# Patient Record
Sex: Male | Born: 1980 | Hispanic: Yes | Marital: Single | Smoking: Never smoker
Health system: Southern US, Community
[De-identification: ages and names within clinical notes are randomized; demographics above are authoritative.]

---

## 2020-11-06 ENCOUNTER — Emergency Department: Payer: Medicaid Other

## 2020-11-06 ENCOUNTER — Inpatient Hospital Stay
Admission: EM | Admit: 2020-11-06 | Discharge: 2020-12-12 | DRG: 974 | Disposition: E | Payer: Medicaid Other | Attending: Pulmonary Disease | Admitting: Pulmonary Disease

## 2020-11-06 ENCOUNTER — Other Ambulatory Visit: Payer: Self-pay

## 2020-11-06 DIAGNOSIS — D649 Anemia, unspecified: Secondary | ICD-10-CM | POA: Diagnosis present

## 2020-11-06 DIAGNOSIS — N179 Acute kidney failure, unspecified: Secondary | ICD-10-CM | POA: Diagnosis present

## 2020-11-06 DIAGNOSIS — Z6825 Body mass index (BMI) 25.0-25.9, adult: Secondary | ICD-10-CM

## 2020-11-06 DIAGNOSIS — R7401 Elevation of levels of liver transaminase levels: Secondary | ICD-10-CM | POA: Diagnosis present

## 2020-11-06 DIAGNOSIS — Z978 Presence of other specified devices: Secondary | ICD-10-CM

## 2020-11-06 DIAGNOSIS — Z66 Do not resuscitate: Secondary | ICD-10-CM | POA: Diagnosis present

## 2020-11-06 DIAGNOSIS — K819 Cholecystitis, unspecified: Secondary | ICD-10-CM

## 2020-11-06 DIAGNOSIS — B9789 Other viral agents as the cause of diseases classified elsewhere: Secondary | ICD-10-CM | POA: Diagnosis present

## 2020-11-06 DIAGNOSIS — R739 Hyperglycemia, unspecified: Secondary | ICD-10-CM | POA: Diagnosis present

## 2020-11-06 DIAGNOSIS — J80 Acute respiratory distress syndrome: Secondary | ICD-10-CM | POA: Diagnosis present

## 2020-11-06 DIAGNOSIS — E875 Hyperkalemia: Secondary | ICD-10-CM | POA: Diagnosis not present

## 2020-11-06 DIAGNOSIS — J939 Pneumothorax, unspecified: Secondary | ICD-10-CM

## 2020-11-06 DIAGNOSIS — Z452 Encounter for adjustment and management of vascular access device: Secondary | ICD-10-CM

## 2020-11-06 DIAGNOSIS — J9600 Acute respiratory failure, unspecified whether with hypoxia or hypercapnia: Secondary | ICD-10-CM

## 2020-11-06 DIAGNOSIS — B250 Cytomegaloviral pneumonitis: Secondary | ICD-10-CM | POA: Diagnosis present

## 2020-11-06 DIAGNOSIS — R6521 Severe sepsis with septic shock: Secondary | ICD-10-CM | POA: Diagnosis present

## 2020-11-06 DIAGNOSIS — E872 Acidosis: Secondary | ICD-10-CM | POA: Diagnosis not present

## 2020-11-06 DIAGNOSIS — E440 Moderate protein-calorie malnutrition: Secondary | ICD-10-CM | POA: Diagnosis present

## 2020-11-06 DIAGNOSIS — B583 Pulmonary toxoplasmosis: Secondary | ICD-10-CM | POA: Diagnosis present

## 2020-11-06 DIAGNOSIS — Z4659 Encounter for fitting and adjustment of other gastrointestinal appliance and device: Secondary | ICD-10-CM

## 2020-11-06 DIAGNOSIS — B59 Pneumocystosis: Secondary | ICD-10-CM

## 2020-11-06 DIAGNOSIS — A419 Sepsis, unspecified organism: Secondary | ICD-10-CM

## 2020-11-06 DIAGNOSIS — Z21 Asymptomatic human immunodeficiency virus [HIV] infection status: Secondary | ICD-10-CM | POA: Diagnosis present

## 2020-11-06 DIAGNOSIS — G9341 Metabolic encephalopathy: Secondary | ICD-10-CM | POA: Diagnosis not present

## 2020-11-06 DIAGNOSIS — R609 Edema, unspecified: Secondary | ICD-10-CM

## 2020-11-06 DIAGNOSIS — A539 Syphilis, unspecified: Secondary | ICD-10-CM

## 2020-11-06 DIAGNOSIS — R0902 Hypoxemia: Secondary | ICD-10-CM

## 2020-11-06 DIAGNOSIS — E8770 Fluid overload, unspecified: Secondary | ICD-10-CM | POA: Diagnosis not present

## 2020-11-06 DIAGNOSIS — B20 Human immunodeficiency virus [HIV] disease: Principal | ICD-10-CM

## 2020-11-06 DIAGNOSIS — R0682 Tachypnea, not elsewhere classified: Secondary | ICD-10-CM

## 2020-11-06 DIAGNOSIS — A4189 Other specified sepsis: Secondary | ICD-10-CM | POA: Diagnosis present

## 2020-11-06 DIAGNOSIS — B259 Cytomegaloviral disease, unspecified: Secondary | ICD-10-CM

## 2020-11-06 DIAGNOSIS — J9601 Acute respiratory failure with hypoxia: Secondary | ICD-10-CM

## 2020-11-06 DIAGNOSIS — Z7951 Long term (current) use of inhaled steroids: Secondary | ICD-10-CM

## 2020-11-06 DIAGNOSIS — Z01818 Encounter for other preprocedural examination: Secondary | ICD-10-CM

## 2020-11-06 DIAGNOSIS — A528 Late syphilis, latent: Secondary | ICD-10-CM | POA: Diagnosis present

## 2020-11-06 DIAGNOSIS — R0600 Dyspnea, unspecified: Secondary | ICD-10-CM

## 2020-11-06 DIAGNOSIS — Z20822 Contact with and (suspected) exposure to covid-19: Secondary | ICD-10-CM | POA: Diagnosis present

## 2020-11-06 DIAGNOSIS — E871 Hypo-osmolality and hyponatremia: Secondary | ICD-10-CM

## 2020-11-06 DIAGNOSIS — J189 Pneumonia, unspecified organism: Secondary | ICD-10-CM | POA: Diagnosis present

## 2020-11-06 LAB — CBC WITH DIFFERENTIAL/PLATELET
Abs Immature Granulocytes: 0.05 10*3/uL (ref 0.00–0.07)
Basophils Absolute: 0 10*3/uL (ref 0.0–0.1)
Basophils Relative: 0 %
Eosinophils Absolute: 0 10*3/uL (ref 0.0–0.5)
Eosinophils Relative: 0 %
HCT: 38.8 % — ABNORMAL LOW (ref 39.0–52.0)
Hemoglobin: 13 g/dL (ref 13.0–17.0)
Immature Granulocytes: 1 %
Lymphocytes Relative: 18 %
Lymphs Abs: 1.6 10*3/uL (ref 0.7–4.0)
MCH: 27.8 pg (ref 26.0–34.0)
MCHC: 33.5 g/dL (ref 30.0–36.0)
MCV: 82.9 fL (ref 80.0–100.0)
Monocytes Absolute: 0.4 10*3/uL (ref 0.1–1.0)
Monocytes Relative: 4 %
Neutro Abs: 7.2 10*3/uL (ref 1.7–7.7)
Neutrophils Relative %: 77 %
Platelets: 324 10*3/uL (ref 150–400)
RBC: 4.68 MIL/uL (ref 4.22–5.81)
RDW: 13.2 % (ref 11.5–15.5)
WBC: 9.2 10*3/uL (ref 4.0–10.5)
nRBC: 0.2 % (ref 0.0–0.2)

## 2020-11-06 LAB — URINALYSIS, COMPLETE (UACMP) WITH MICROSCOPIC
Bacteria, UA: NONE SEEN
Bilirubin Urine: NEGATIVE
Glucose, UA: NEGATIVE mg/dL
Ketones, ur: NEGATIVE mg/dL
Leukocytes,Ua: NEGATIVE
Nitrite: NEGATIVE
Protein, ur: 30 mg/dL — AB
Specific Gravity, Urine: 1.014 (ref 1.005–1.030)
Squamous Epithelial / HPF: NONE SEEN (ref 0–5)
pH: 6 (ref 5.0–8.0)

## 2020-11-06 LAB — COMPREHENSIVE METABOLIC PANEL
ALT: 54 U/L — ABNORMAL HIGH (ref 0–44)
AST: 67 U/L — ABNORMAL HIGH (ref 15–41)
Albumin: 2.7 g/dL — ABNORMAL LOW (ref 3.5–5.0)
Alkaline Phosphatase: 129 U/L — ABNORMAL HIGH (ref 38–126)
Anion gap: 13 (ref 5–15)
BUN: 18 mg/dL (ref 6–20)
CO2: 19 mmol/L — ABNORMAL LOW (ref 22–32)
Calcium: 8.3 mg/dL — ABNORMAL LOW (ref 8.9–10.3)
Chloride: 94 mmol/L — ABNORMAL LOW (ref 98–111)
Creatinine, Ser: 1.14 mg/dL (ref 0.61–1.24)
GFR, Estimated: >60 mL/min (ref >60)
Glucose, Bld: 127 mg/dL — ABNORMAL HIGH (ref 70–99)
Potassium: 3.5 mmol/L (ref 3.5–5.1)
Sodium: 126 mmol/L — ABNORMAL LOW (ref 135–145)
Total Bilirubin: 0.7 mg/dL (ref 0.3–1.2)
Total Protein: 8.4 g/dL — ABNORMAL HIGH (ref 6.5–8.1)

## 2020-11-06 LAB — PROTIME-INR
INR: 1.1 (ref 0.8–1.2)
Prothrombin Time: 14.5 seconds (ref 11.4–15.2)

## 2020-11-06 LAB — LACTIC ACID, PLASMA: Lactic Acid, Venous: 1.9 mmol/L (ref 0.5–1.9)

## 2020-11-06 LAB — RESP PANEL BY RT-PCR (FLU A&B, COVID) ARPGX2
Influenza A by PCR: NEGATIVE
Influenza B by PCR: NEGATIVE
SARS Coronavirus 2 by RT PCR: NEGATIVE

## 2020-11-06 LAB — APTT: aPTT: 33 seconds (ref 24–36)

## 2020-11-06 LAB — TROPONIN I (HIGH SENSITIVITY)
Troponin I (High Sensitivity): 65 ng/L — ABNORMAL HIGH (ref <18)
Troponin I (High Sensitivity): 91 ng/L — ABNORMAL HIGH (ref <18)

## 2020-11-06 LAB — BRAIN NATRIURETIC PEPTIDE: B Natriuretic Peptide: 44.7 pg/mL (ref 0.0–100.0)

## 2020-11-06 MED ORDER — ACETAMINOPHEN 325 MG PO TABS
ORAL_TABLET | ORAL | Status: AC
  Administered 2020-11-06: 650 mg via ORAL
  Filled 2020-11-06: qty 2

## 2020-11-06 MED ORDER — SODIUM CHLORIDE 0.9 % IV SOLN
2.0000 g | INTRAVENOUS | Status: DC
  Administered 2020-11-06: 2 g via INTRAVENOUS
  Filled 2020-11-06: qty 20

## 2020-11-06 MED ORDER — SODIUM CHLORIDE 0.9 % IV SOLN
500.0000 mg | INTRAVENOUS | Status: AC
  Administered 2020-11-06 – 2020-11-11 (×6): 500 mg via INTRAVENOUS
  Filled 2020-11-06 (×7): qty 500

## 2020-11-06 MED ORDER — LACTATED RINGERS IV BOLUS (SEPSIS)
1000.0000 mL | Freq: Once | INTRAVENOUS | Status: AC
  Administered 2020-11-06: 1000 mL via INTRAVENOUS

## 2020-11-06 MED ORDER — ACETAMINOPHEN 325 MG PO TABS: 650.0000 mg | ORAL_TABLET | Freq: Once | ORAL | Status: AC

## 2020-11-06 NOTE — ED Provider Notes (Signed)
[facility] Emergency Department Provider Note   ____________________________________________   Event Date/Time   First MD Initiated Contact with Patient Nov 10, 2020 2052     (approximate)  I have reviewed the triage vital signs and the nursing notes.   HISTORY  Chief Complaint Shortness of Breath    HPI Jordan Gibson is a 40 y.o. male with no stated past medical history presents for shortness of breath and fevers and 6 days prior to arrival.  Patient states that the symptoms are worsened since onset as well as worsen with any exertion.  Patient denies any recent travel or sick contacts.  Patient denies any history of similar symptoms prior to last Friday.  Patient currently denies any vision changes, tinnitus, difficulty speaking, facial droop, sore throat, chest pain, abdominal pain, nausea/vomiting/diarrhea, dysuria, or weakness/numbness/paresthesias in any extremity         History reviewed. No pertinent past medical history.  Patient Active Problem List   Diagnosis Date Noted  . CAP (community acquired pneumonia) 11-10-20  . Sepsis (HCC) 2020/11/10  . Acute respiratory failure with hypoxia (HCC) 11/10/20  . Hyponatremia 11/10/20  . Transaminitis 11-10-20      Prior to Admission medications   Medication Sig Start Date End Date Taking? Authorizing Provider  acetaminophen (TYLENOL) 325 MG tablet Take 650 mg by mouth every 6 (six) hours as needed.   Yes [provider]  albuterol (VENTOLIN HFA) 108 (90 Base) MCG/ACT inhaler Inhale 2 puffs into the lungs every 4 (four) hours as needed. 10/31/20  Yes [provider]  fluticasone-salmeterol (ADVAIR) 100-50 MCG/ACT AEPB Inhale 1 puff into the lungs 2 (two) times daily. 11/03/20  Yes [provider]  Vitamin D, Ergocalciferol, (DRISDOL) 1.25 MG (50000 UNIT) CAPS capsule Take 1 capsule by mouth once a week. 10/31/20  Yes [provider]     Allergies Patient has no known allergies.  History reviewed. No pertinent family history.  Social History Social History   Tobacco Use  . Smoking status: Never Smoker  . Smokeless tobacco: Never Used  Substance Use Topics  . Alcohol use: Yes    Alcohol/week: 1.0 standard drink    Types: 1 Cans of beer per week    Review of Systems Constitutional: No fever/chills Eyes: No visual changes. ENT: No sore throat. Cardiovascular: Denies chest pain. Respiratory: Endorses shortness of breath. Gastrointestinal: No abdominal pain.  No nausea, no vomiting.  No diarrhea. Genitourinary: Negative for dysuria. Musculoskeletal: Negative for acute arthralgias Skin: Negative for rash. Neurological: Negative for headaches, weakness/numbness/paresthesias in any extremity Psychiatric: Negative for suicidal ideation/homicidal ideation   ____________________________________________   PHYSICAL EXAM:  VITAL SIGNS: ED Triage Vitals  Enc Vitals Group     BP 11-10-20 2039 129/80     Pulse Rate 10-Nov-2020 2039 (!) 132     Resp 11-10-2020 2039 (!) 24     Temp 11/10/20 2039 (!) 102.1 F (38.9 C)     Temp Source 10-Nov-2020 2039 Oral     SpO2 2020-11-10 2039 (!) 52 %     Weight 10-Nov-2020 2040 170 lb (77.1 kg)     Height --      Head Circumference --      Peak Flow --      Pain Score Nov 10, 2020 2040 0     Pain Loc --      Pain Edu? --      Excl. in GC? --    Constitutional: Alert and oriented. Well appearing and in no  acute distress. Eyes: Conjunctivae are normal. PERRL. Head: Atraumatic. Nose: No congestion/rhinnorhea. Mouth/Throat: Mucous membranes are moist. Neck: No stridor Cardiovascular: Grossly normal heart sounds.  Good peripheral circulation. Respiratory: Increased respiratory effort.  High flow nasal cannula in place.  Rales appreciated over bilateral lower lung fields Gastrointestinal: Soft and nontender. No distention. Musculoskeletal: No obvious deformities Neurologic:  Normal  speech and language. No gross focal neurologic deficits are appreciated. Skin:  Skin is warm and dry. No rash noted. Psychiatric: Mood and affect are normal. Speech and behavior are normal.  ____________________________________________   LABS (all labs ordered are listed, but only abnormal results are displayed)  Labs Reviewed  RESPIRATORY PANEL BY PCR - Abnormal; Notable for the following components:      Result Value   Rhinovirus / Enterovirus DETECTED (*)    All other components within normal limits  COMPREHENSIVE METABOLIC PANEL - Abnormal; Notable for the following components:   Sodium 126 (*)    Chloride 94 (*)    CO2 19 (*)    Glucose, Bld 127 (*)    Calcium 8.3 (*)    Total Protein 8.4 (*)    Albumin 2.7 (*)    AST 67 (*)    ALT 54 (*)    Alkaline Phosphatase 129 (*)    All other components within normal limits  CBC WITH DIFFERENTIAL/PLATELET - Abnormal; Notable for the following components:   HCT 38.8 (*)    All other components within normal limits  URINALYSIS, COMPLETE (UACMP) WITH MICROSCOPIC - Abnormal; Notable for the following components:   Color, Urine YELLOW (*)    APPearance HAZY (*)    Hgb urine dipstick SMALL (*)    Protein, ur 30 (*)    All other components within normal limits  BLOOD GAS, ARTERIAL - Abnormal; Notable for the following components:   pH, Arterial 7.50 (*)    pCO2 arterial 27 (*)    All other components within normal limits  CORTISOL-AM, BLOOD - Abnormal; Notable for the following components:   Cortisol - AM 31.1 (*)    All other components within normal limits  CBC - Abnormal; Notable for the following components:   Hemoglobin 12.0 (*)    HCT 35.1 (*)    All other components within normal limits  BASIC METABOLIC PANEL - Abnormal; Notable for the following components:   Sodium 129 (*)    Chloride 97 (*)    Calcium 8.0 (*)    All other components within normal limits  D-DIMER, QUANTITATIVE - Abnormal; Notable for the following  components:   D-Dimer, Quant 3.07 (*)    All other components within normal limits  GLUCOSE, CAPILLARY - Abnormal; Notable for the following components:   Glucose-Capillary 104 (*)    All other components within normal limits  BLOOD GAS, ARTERIAL - Abnormal; Notable for the following components:   pH, Arterial 7.19 (*)    pCO2 arterial 64 (*)    pO2, Arterial 70 (*)    Acid-base deficit 5.0 (*)    All other components within normal limits  GLUCOSE, CAPILLARY - Abnormal; Notable for the following components:   Glucose-Capillary 113 (*)    All other components within normal limits  BASIC METABOLIC PANEL - Abnormal; Notable for the following components:   Sodium 130 (*)    Chloride 97 (*)    Glucose, Bld 109 (*)    Calcium 8.0 (*)    All other components within normal limits  CBC - Abnormal; Notable for the  following components:   WBC 12.5 (*)    Hemoglobin 12.5 (*)    HCT 38.8 (*)    All other components within normal limits  PHOSPHORUS - Abnormal; Notable for the following components:   Phosphorus 5.9 (*)    All other components within normal limits  C-REACTIVE PROTEIN - Abnormal; Notable for the following components:   CRP 31.9 (*)    All other components within normal limits  PROTIME-INR - Abnormal; Notable for the following components:   Prothrombin Time 15.8 (*)    INR 1.3 (*)    All other components within normal limits  BLOOD GAS, ARTERIAL - Abnormal; Notable for the following components:   pH, Arterial 7.30 (*)    pO2, Arterial 239 (*)    Acid-base deficit 3.2 (*)    All other components within normal limits  GLUCOSE, CAPILLARY - Abnormal; Notable for the following components:   Glucose-Capillary 112 (*)    All other components within normal limits  GLUCOSE, CAPILLARY - Abnormal; Notable for the following components:   Glucose-Capillary 163 (*)    All other components within normal limits  GLUCOSE, CAPILLARY - Abnormal; Notable for the following components:    Glucose-Capillary 161 (*)    All other components within normal limits  TRIGLYCERIDES - Abnormal; Notable for the following components:   Triglycerides 379 (*)    All other components within normal limits  GLUCOSE, CAPILLARY - Abnormal; Notable for the following components:   Glucose-Capillary 154 (*)    All other components within normal limits  COMPREHENSIVE METABOLIC PANEL - Abnormal; Notable for the following components:   Glucose, Bld 170 (*)    BUN 24 (*)    Calcium 8.8 (*)    Albumin 2.0 (*)    All other components within normal limits  MAGNESIUM - Abnormal; Notable for the following components:   Magnesium 2.7 (*)    All other components within normal limits  PHOSPHORUS - Abnormal; Notable for the following components:   Phosphorus 4.9 (*)    All other components within normal limits  CBC WITH DIFFERENTIAL/PLATELET - Abnormal; Notable for the following components:   RBC 3.94 (*)    Hemoglobin 10.9 (*)    HCT 33.8 (*)    nRBC 0.3 (*)    Lymphs Abs 0.6 (*)    All other components within normal limits  GLUCOSE, CAPILLARY - Abnormal; Notable for the following components:   Glucose-Capillary 148 (*)    All other components within normal limits  GLUCOSE, CAPILLARY - Abnormal; Notable for the following components:   Glucose-Capillary 155 (*)    All other components within normal limits  GLUCOSE, CAPILLARY - Abnormal; Notable for the following components:   Glucose-Capillary 171 (*)    All other components within normal limits  TROPONIN I (HIGH SENSITIVITY) - Abnormal; Notable for the following components:   Troponin I (High Sensitivity) 65 (*)    All other components within normal limits  TROPONIN I (HIGH SENSITIVITY) - Abnormal; Notable for the following components:   Troponin I (High Sensitivity) 91 (*)    All other components within normal limits  CULTURE, BLOOD (ROUTINE X 2)  CULTURE, BLOOD (ROUTINE X 2)  RESP PANEL BY RT-PCR (FLU A&B, COVID) ARPGX2  URINE CULTURE   EXPECTORATED SPUTUM ASSESSMENT W GRAM STAIN, RFLX TO RESP C  MRSA PCR SCREENING  RESP PANEL BY RT-PCR (FLU A&B, COVID) ARPGX2  CULTURE, RESPIRATORY W GRAM STAIN  CULTURE, RESPIRATORY W GRAM STAIN  LACTIC ACID, PLASMA  PROTIME-INR  APTT  BRAIN NATRIURETIC PEPTIDE  PROCALCITONIN  PROTIME-INR  PROCALCITONIN  LACTIC ACID, PLASMA  STREP PNEUMONIAE URINARY ANTIGEN  MAGNESIUM  OSMOLALITY  PROCALCITONIN  LACTIC ACID, PLASMA  GLUCOSE, CAPILLARY  BRAIN NATRIURETIC PEPTIDE  LEGIONELLA PNEUMOPHILA SEROGP 1 UR AG  MYCOPLASMA PNEUMONIAE ANTIBODY, IGM  HIV ANTIBODY (ROUTINE TESTING W REFLEX)   ____________________________________________  EKG  ED ECG REPORT I, Merwyn Katos, the attending physician, personally viewed and interpreted this ECG.  Date: 11/05/2020 EKG Time: 2049 Rate: 129 Rhythm: Tachycardic sinus rhythm QRS Axis: normal Intervals: normal ST/T Wave abnormalities: normal Narrative Interpretation: no evidence of acute ischemia  ____________________________________________  RADIOLOGY  ED MD interpretation:  Single view portable XR of the chest shows diffue bilateral airspcae disease concerning for edema vs PNA  Official radiology report(s): DG Chest Port 1 View  Result Date: 11/09/2020 CLINICAL DATA:  40 year old male with endotracheal tube. EXAM: PORTABLE CHEST 1 VIEW COMPARISON:  Chest x-ray 11/08/2020. FINDINGS: Endotracheal tube is low lying with tip now only 9 mm above the carina. Insert NG tube lung volumes are low. Patchy ill-defined opacities and widespread areas of interstitial prominence are noted throughout the lungs bilaterally. Overall, aeration has improved compared to the prior study. No pneumothorax. No definite pleural effusions. No engorgement of the pulmonary vasculature to suggest pulmonary edema. Heart size is normal. Upper mediastinal contours are within normal limits. IMPRESSION: 1. Support apparatus, as above. Please take note of the low lying  position of the endotracheal tube and consider withdrawal approximately 5 cm for more optimal placement. 2. Persistent bilateral airspace disease, improved compared to the prior study, suggesting resolving multilobar pneumonia. Electronically Signed   By: Trudie Reed M.D.   On: 11/09/2020 08:04   ECHOCARDIOGRAM COMPLETE  Result Date: 11/08/2020    ECHOCARDIOGRAM REPORT   Patient Name:   Jordan Gibson Date of Exam: 11/08/2020 Medical Rec #:  [id number]           Height:       67.0 in Accession #:    [id number]          Weight:       176.6 lb Date of Birth:  17-Nov-1980           BSA:          1.918 m Patient Age:    40 years            BP:           104/64 mmHg Patient Gender: M                   HR:           62 bpm. Exam Location:  [facility] Procedure: 2D Echo and Strain Analysis Indications:     Acute respiratory distress R06.03  History:         Patient has no prior history of Echocardiogram examinations.  Sonographer:     Overton Mam RDCS Referring Phys:  [id number] Judithe Modest Diagnosing Phys: Julien Nordmann MD  Sonographer Comments: Echo performed with patient supine and on artificial respirator. Global longitudinal strain was attempted. IMPRESSIONS  1. Left ventricular ejection fraction, by estimation, is 60 to 65%. The left ventricle has normal function. The left ventricle has no regional wall motion abnormalities. There is mild left ventricular hypertrophy. Left ventricular diastolic parameters were normal. The average left ventricular global longitudinal strain is -18.7 %. The global longitudinal strain is normal.  2. Right ventricular systolic function is  normal. The right ventricular size is normal. There is normal pulmonary artery systolic pressure. The estimated right ventricular systolic pressure is 30.0 mmHg. FINDINGS  Left Ventricle: Left ventricular ejection fraction, by estimation, is 60 to 65%. The left ventricle has normal function. The left ventricle has no regional wall motion  abnormalities. The average left ventricular global longitudinal strain is -18.7 %. The global longitudinal strain is normal. The left ventricular internal cavity size was normal in size. There is mild left ventricular hypertrophy. Left ventricular diastolic parameters were normal. Right Ventricle: The right ventricular size is normal. No increase in right ventricular wall thickness. Right ventricular systolic function is normal. There is normal pulmonary artery systolic pressure. The tricuspid regurgitant velocity is 2.50 m/s, and  with an assumed right atrial pressure of 5 mmHg, the estimated right ventricular systolic pressure is 30.0 mmHg. Left Atrium: Left atrial size was normal in size. Right Atrium: Right atrial size was normal in size. Pericardium: There is no evidence of pericardial effusion. Mitral Valve: The mitral valve is normal in structure. No evidence of mitral valve regurgitation. No evidence of mitral valve stenosis. Tricuspid Valve: The tricuspid valve is normal in structure. Tricuspid valve regurgitation is mild . No evidence of tricuspid stenosis. Aortic Valve: The aortic valve is normal in structure. Aortic valve regurgitation is not visualized. No aortic stenosis is present. Aortic valve peak gradient measures 5.9 mmHg. Pulmonic Valve: The pulmonic valve was normal in structure. Pulmonic valve regurgitation is not visualized. No evidence of pulmonic stenosis. Aorta: The aortic root is normal in size and structure. Venous: The inferior vena cava is normal in size with greater than 50% respiratory variability, suggesting right atrial pressure of 3 mmHg. IAS/Shunts: No atrial level shunt detected by color flow Doppler.  LEFT VENTRICLE PLAX 2D LVIDd:         4.34 cm  Diastology LVIDs:         2.66 cm  LV e' medial:    9.68 cm/s LV PW:         1.34 cm  LV E/e' medial:  8.5 LV IVS:        1.23 cm  LV e' lateral:   14.30 cm/s LVOT diam:     2.10 cm  LV E/e' lateral: 5.8 LV SV:         73 LV SV Index:    38       2D Longitudinal Strain LVOT Area:     3.46 cm 2D Strain GLS Avg:     -18.7 %  RIGHT VENTRICLE RV Basal diam:  3.00 cm TAPSE (M-mode): 2.0 cm LEFT ATRIUM             Index       RIGHT ATRIUM           Index LA diam:        3.70 cm 1.93 cm/m  RA Area:     15.80 cm LA Vol (A2C):   43.2 ml 22.52 ml/m RA Volume:   42.10 ml  21.95 ml/m LA Vol (A4C):   39.5 ml 20.59 ml/m LA Biplane Vol: 45.5 ml 23.72 ml/m  AORTIC VALVE                PULMONIC VALVE AV Area (Vmax): 2.67 cm    PV Vmax:       0.83 m/s AV Vmax:        121.00 cm/s PV Peak grad:  2.7 mmHg AV Peak Grad:   5.9 mmHg  LVOT Vmax:      93.30 cm/s LVOT Vmean:     63.800 cm/s LVOT VTI:       0.210 m  AORTA Ao Root diam: 2.80 cm Ao Asc diam:  2.80 cm MITRAL VALVE               TRICUSPID VALVE MV Area (PHT): 3.79 cm    TV Peak grad:   25.0 mmHg MV Decel Time: 200 msec    TV Vmax:        2.50 m/s MV E velocity: 82.30 cm/s  TR Peak grad:   25.0 mmHg MV A velocity: 40.70 cm/s  TR Vmax:        250.00 cm/s MV E/A ratio:  2.02                            SHUNTS                            Systemic VTI:  0.21 m                            Systemic Diam: 2.10 cm Julien Nordmann MD Electronically signed by Julien Nordmann MD Signature Date/Time: 11/08/2020/3:22:46 PM    Final     ____________________________________________   PROCEDURES  Procedure(s) performed (including Critical Care):  .Critical Care Performed by: Merwyn Katos, MD Authorized by: Merwyn Katos, MD   Critical care provider statement:    Critical care time (minutes):  45   Critical care time was exclusive of:  Separately billable procedures and treating other patients   Critical care was necessary to treat or prevent imminent or life-threatening deterioration of the following conditions:  Respiratory failure   Critical care was time spent personally by me on the following activities:  Discussions with consultants, evaluation of patient's response to treatment, examination of  patient, ordering and performing treatments and interventions, ordering and review of laboratory studies, ordering and review of radiographic studies, pulse oximetry, re-evaluation of patient's condition, obtaining history from patient or surrogate and review of old charts   I assumed direction of critical care for this patient from another provider in my specialty: no     Care discussed with: admitting provider   .1-3 Lead EKG Interpretation Performed by: Merwyn Katos, MD Authorized by: Merwyn Katos, MD     Interpretation: abnormal     ECG rate:  105   ECG rate assessment: tachycardic     Rhythm: sinus tachycardia     Ectopy: none     Conduction: normal       ____________________________________________   INITIAL IMPRESSION / ASSESSMENT AND PLAN / ED COURSE  As part of my medical decision making, I reviewed the following data within the electronic MEDICAL RECORD NUMBER Nursing notes reviewed and incorporated, Labs reviewed, EKG interpreted, Old chart reviewed, Radiograph reviewed and Notes from prior ED visits reviewed and incorporated        Given History, Exam, and Workup presentation most consistent with pneumonia.Presentation not consistent with PE, COPD exacerbation, Pneumothorax, TB, Atypical ACS, Esophageal Rupture, Toxic Exposure, Foreign Body Airway Obstruction.  Given appearance of CXR, concern for multifocal PNA. Pt continues to require large amounts of supplemental O2 and will therefore require admission to the internal medicine service for further eval and management.      ____________________________________________   FINAL  CLINICAL IMPRESSION(S) / ED DIAGNOSES  Final diagnoses:  Sepsis (HCC)  Edema  Hypoxia  Encounter for orogastric (OG) tube placement  Encounter for intubation  Encounter for orogastric (OG) tube placement  Encounter for intubation  Endotracheal tube present     ED Discharge Orders    None       Note:  This document was  prepared using Dragon voice recognition software and may include unintentional dictation errors.   Merwyn Katos, MD 11/09/20 [phone]

## 2020-11-06 NOTE — ED Notes (Addendum)
Upon arrival to room, pt was placed on 6 liter's [place]. Pt switched to 15 liter's nonrebreather due to continued sats- in the 30-60%'s. Oxygen remains under 80% with non-rebreather, RT at bedside placing hi-flow [place].

## 2020-11-06 NOTE — Progress Notes (Signed)
Patient complains of SOB, with noted hypoxia via pulse oximetry. RT at bedside. Started patient on Heated HFNC 55L @ 60% for saturations of 94-96%. Will continue to monitor.

## 2020-11-06 NOTE — ED Triage Notes (Signed)
Pt in with co shob and fever since last week. No co pain at this time, states worse on exertion.

## 2020-11-06 NOTE — ED Notes (Signed)
X-ray at bedside

## 2020-11-06 NOTE — Consult Note (Signed)
CODE SEPSIS - PHARMACY COMMUNICATION  **Broad Spectrum Antibiotics should be administered within 1 hour of Sepsis diagnosis**  Time Code Sepsis Called/Page Received: 2056  Antibiotics Ordered: 2054  Time of 1st antibiotic administration: 2057  Additional action taken by pharmacy: n/a  If necessary, Name of Provider/Nurse Contacted: n/a  Martyn Malay ,PharmD Clinical Pharmacist  12-Nov-2020  8:57 PM\

## 2020-11-06 NOTE — ED Notes (Signed)
RT paged for oxygen of 85%

## 2020-11-06 NOTE — ED Notes (Signed)
Sister informed that she may come back once covid test results negative by pt relations at this time.

## 2020-11-06 NOTE — ED Notes (Signed)
Pt relations notified sister may be brought back.

## 2020-11-06 NOTE — ED Notes (Signed)
Pt educated on plan of care and likely admission. Pt informed his sister will be updated and allowed back once covid test comes back negative. Pt given urinal and instructed on use. Pt needs assessed, pt denies and further questions or needs at this time.  Translator used for conversation.

## 2020-11-07 ENCOUNTER — Inpatient Hospital Stay: Payer: Medicaid Other

## 2020-11-07 ENCOUNTER — Encounter: Payer: Self-pay | Admitting: Internal Medicine

## 2020-11-07 LAB — BLOOD GAS, ARTERIAL
Acid-base deficit: 0.7 mmol/L (ref 0.0–2.0)
Bicarbonate: 21.1 mmol/L (ref 20.0–28.0)
FIO2: 0.8
O2 Saturation: 98.6 %
Patient temperature: 37
pCO2 arterial: 27 mmHg — ABNORMAL LOW (ref 32.0–48.0)
pH, Arterial: 7.5 — ABNORMAL HIGH (ref 7.350–7.450)
pO2, Arterial: 108 mmHg (ref 83.0–108.0)

## 2020-11-07 LAB — BASIC METABOLIC PANEL
Anion gap: 10 (ref 5–15)
BUN: 15 mg/dL (ref 6–20)
CO2: 22 mmol/L (ref 22–32)
Calcium: 8 mg/dL — ABNORMAL LOW (ref 8.9–10.3)
Chloride: 97 mmol/L — ABNORMAL LOW (ref 98–111)
Creatinine, Ser: 1.03 mg/dL (ref 0.61–1.24)
GFR, Estimated: >60 mL/min (ref >60)
Glucose, Bld: 92 mg/dL (ref 70–99)
Potassium: 4.1 mmol/L (ref 3.5–5.1)
Sodium: 129 mmol/L — ABNORMAL LOW (ref 135–145)

## 2020-11-07 LAB — CBC
HCT: 35.1 % — ABNORMAL LOW (ref 39.0–52.0)
Hemoglobin: 12 g/dL — ABNORMAL LOW (ref 13.0–17.0)
MCH: 28.3 pg (ref 26.0–34.0)
MCHC: 34.2 g/dL (ref 30.0–36.0)
MCV: 82.8 fL (ref 80.0–100.0)
Platelets: 268 10*3/uL (ref 150–400)
RBC: 4.24 MIL/uL (ref 4.22–5.81)
RDW: 13.3 % (ref 11.5–15.5)
WBC: 8.9 10*3/uL (ref 4.0–10.5)
nRBC: 0 % (ref 0.0–0.2)

## 2020-11-07 LAB — PROTIME-INR
INR: 1.2 (ref 0.8–1.2)
Prothrombin Time: 14.7 seconds (ref 11.4–15.2)

## 2020-11-07 LAB — GLUCOSE, CAPILLARY: Glucose-Capillary: 104 mg/dL — ABNORMAL HIGH (ref 70–99)

## 2020-11-07 LAB — EXPECTORATED SPUTUM ASSESSMENT W GRAM STAIN, RFLX TO RESP C

## 2020-11-07 LAB — CORTISOL-AM, BLOOD: Cortisol - AM: 31.1 ug/dL — ABNORMAL HIGH (ref 6.7–22.6)

## 2020-11-07 LAB — LACTIC ACID, PLASMA: Lactic Acid, Venous: 1.4 mmol/L (ref 0.5–1.9)

## 2020-11-07 LAB — PROCALCITONIN
Procalcitonin: 2.89 ng/mL
Procalcitonin: 3.07 ng/mL

## 2020-11-07 LAB — D-DIMER, QUANTITATIVE: D-Dimer, Quant: 3.07 ug/mL-FEU — ABNORMAL HIGH (ref 0.00–0.50)

## 2020-11-07 LAB — STREP PNEUMONIAE URINARY ANTIGEN: Strep Pneumo Urinary Antigen: NEGATIVE

## 2020-11-07 LAB — MRSA PCR SCREENING: MRSA by PCR: NEGATIVE

## 2020-11-07 MED ORDER — SODIUM CHLORIDE 0.9 % IV BOLUS (SEPSIS): 500.0000 mL | Freq: Once | INTRAVENOUS | Status: DC

## 2020-11-07 MED ORDER — ACETAMINOPHEN 325 MG PO TABS
650.0000 mg | ORAL_TABLET | Freq: Four times a day (QID) | ORAL | Status: DC | PRN
  Administered 2020-11-07 (×2): 650 mg via ORAL
  Filled 2020-11-07 (×3): qty 2

## 2020-11-07 MED ORDER — LACTATED RINGERS IV BOLUS (SEPSIS)
1000.0000 mL | Freq: Once | INTRAVENOUS | Status: AC
  Administered 2020-11-07: 1000 mL via INTRAVENOUS

## 2020-11-07 MED ORDER — HYDROCODONE-ACETAMINOPHEN 5-325 MG PO TABS: 1.0000 | ORAL_TABLET | ORAL | Status: DC | PRN

## 2020-11-07 MED ORDER — VANCOMYCIN HCL 1500 MG/300ML IV SOLN
1500.0000 mg | Freq: Once | INTRAVENOUS | Status: AC
  Administered 2020-11-07: 1500 mg via INTRAVENOUS
  Filled 2020-11-07: qty 300

## 2020-11-07 MED ORDER — LACTATED RINGERS IV SOLN: INTRAVENOUS | Status: DC

## 2020-11-07 MED ORDER — CHLORHEXIDINE GLUCONATE CLOTH 2 % EX PADS
6.0000 | MEDICATED_PAD | Freq: Every day | CUTANEOUS | Status: DC
  Administered 2020-11-07 – 2020-11-08 (×2): 6 via TOPICAL

## 2020-11-07 MED ORDER — SODIUM CHLORIDE 0.9 % IV SOLN: INTRAVENOUS | Status: DC

## 2020-11-07 MED ORDER — ENOXAPARIN SODIUM 40 MG/0.4ML IJ SOSY
40.0000 mg | PREFILLED_SYRINGE | INTRAMUSCULAR | Status: DC
  Administered 2020-11-07 – 2020-12-01 (×25): 40 mg via SUBCUTANEOUS
  Filled 2020-11-07 (×25): qty 0.4

## 2020-11-07 MED ORDER — SODIUM CHLORIDE 0.9 % IV SOLN
2.0000 g | Freq: Three times a day (TID) | INTRAVENOUS | Status: DC
  Administered 2020-11-07 – 2020-11-10 (×9): 2 g via INTRAVENOUS
  Filled 2020-11-07 (×11): qty 2

## 2020-11-07 MED ORDER — SODIUM CHLORIDE 0.9 % IV SOLN: 2.0000 g | INTRAVENOUS | Status: DC

## 2020-11-07 MED ORDER — ACETAMINOPHEN 650 MG RE SUPP: 650.0000 mg | Freq: Four times a day (QID) | RECTAL | Status: DC | PRN

## 2020-11-07 MED ORDER — ONDANSETRON HCL 4 MG PO TABS: 4.0000 mg | ORAL_TABLET | Freq: Four times a day (QID) | ORAL | Status: DC | PRN

## 2020-11-07 MED ORDER — SODIUM CHLORIDE 0.9 % IV SOLN: 500.0000 mg | INTRAVENOUS | Status: DC

## 2020-11-07 MED ORDER — ONDANSETRON HCL 4 MG/2ML IJ SOLN: 4.0000 mg | Freq: Four times a day (QID) | INTRAMUSCULAR | Status: DC | PRN

## 2020-11-07 MED ORDER — PHENOL 1.4 % MT LIQD
1.0000 | OROMUCOSAL | Status: DC | PRN
  Filled 2020-11-07: qty 177

## 2020-11-07 MED ORDER — GUAIFENESIN-DM 100-10 MG/5ML PO SYRP: 5.0000 mL | ORAL_SOLUTION | ORAL | Status: DC | PRN

## 2020-11-07 MED ORDER — VANCOMYCIN HCL 1000 MG/200ML IV SOLN
1000.0000 mg | Freq: Two times a day (BID) | INTRAVENOUS | Status: DC
  Administered 2020-11-08: 1000 mg via INTRAVENOUS
  Filled 2020-11-07 (×3): qty 200

## 2020-11-07 NOTE — H&P (Signed)
History and Physical    Jordan Gibson [place] QVZ:[id number] DOB: 03-21-1981 DOA: 10/24/2020  PCP: Pcp, No   Patient coming from: Home  I have personally briefly reviewed patient's old medical records in [place]  Chief Complaint: Shortness of breath,  HPI: Jordan Gibson is a 40 y.o. male with  no significant past medical history presenting with a 1 week history of nonproductive cough, shortness of breath and fever.  Denies nausea, vomiting and chest pain.  Denies change in bowel habits.  Denies abdominal pain.  Denies lower extremity pain or swelling.  Denies recent travel or sick contacts. ED course: On arrival, febrile to 102, tachycardic at 132 with respirations 24 and O2 sat 52% on room air, requiring HFNC at 31 to maintain sats in the mid 90s.  Blood work significant for normal WBC of 9.2 with lactic acid 1.9.  BNP 44, troponin of 65, sodium 126 with mild elevation in liver enzymes with AST/ALT of 67/54 and alk phos of 129.  Urinalysis unremarkable.  Arterial blood gas on FiO2 80% with pH 7.5, PCO2 of 27 and PO2 108.  Procalcitonin 3.07 EKG, personally viewed and interpreted: Sinus tachycardia at 129 with no acute ST-T wave changes Imaging: Chest x-ray with symmetric bilateral pulmonary airspace disease which may be due to edema, atypical infection or less likely hemorrhage.  Patient started on IV Rocephin and azithromycin and IV sepsis fluid bolus.  Hospitalist consulted for admission.  Review of Systems: As per HPI otherwise all other systems on review of systems negative.    History reviewed. No pertinent past medical history.  History reviewed. No pertinent surgical history.   reports that he has never smoked. He has never used smokeless tobacco. He reports current alcohol use of about 1.0 standard drink of alcohol per week. No history on file for drug use.  No Known Allergies  History reviewed. No pertinent family history.    Prior to Admission medications    Medication Sig Start Date End Date Taking? Authorizing Provider  acetaminophen (TYLENOL) 325 MG tablet Take 650 mg by mouth every 6 (six) hours as needed.   Yes [provider]  albuterol (VENTOLIN HFA) 108 (90 Base) MCG/ACT inhaler Inhale 2 puffs into the lungs every 4 (four) hours as needed. 10/31/20  Yes [provider]  fluticasone-salmeterol (ADVAIR) 100-50 MCG/ACT AEPB Inhale 1 puff into the lungs 2 (two) times daily. 11/03/20  Yes [provider]  Vitamin D, Ergocalciferol, (DRISDOL) 1.25 MG (50000 UNIT) CAPS capsule Take 1 capsule by mouth once a week. 10/31/20  Yes [provider]    Physical Exam: Vitals:   10/12/2020 2300 11/11/2020 2330 11/07/20 0000 11/07/20 0100  BP: 127/83 (!) 129/94 125/82 125/85  Pulse: (!) 102 99 96 93  Resp: (!) 28 (!) 28 (!) 25 (!) 29  Temp:      TempSrc:      SpO2: 98% 98% 98% 94%  Weight:         Vitals:   10/20/2020 2300 11/05/2020 2330 11/07/20 0000 11/07/20 0100  BP: 127/83 (!) 129/94 125/82 125/85  Pulse: (!) 102 99 96 93  Resp: (!) 28 (!) 28 (!) 25 (!) 29  Temp:      TempSrc:      SpO2: 98% 98% 98% 94%  Weight:          Constitutional: Alert and oriented x 3 .  Conversational dyspnea HEENT:      Head: Normocephalic and atraumatic.  Eyes: PERLA, EOMI, Conjunctivae are normal. Sclera is non-icteric.       Mouth/Throat: Mucous membranes are moist.       Neck: Supple with no signs of meningismus. Cardiovascular:  Tachycardic. No murmurs, gallops, or rubs. 2+ symmetrical distal pulses are present . No JVD. No LE edema Respiratory: Respiratory effort increased.Lungs sounds diminished bilaterally. No wheezes, crackles, or rhonchi.  Gastrointestinal: Soft, non tender, and non distended with positive bowel sounds.  Genitourinary: No CVA tenderness. Musculoskeletal: Nontender with normal range of motion in all extremities. No cyanosis, or erythema of extremities. Neurologic:  Face is symmetric. Moving all  extremities. No gross focal neurologic deficits . Skin: Skin is warm, dry.  No rash or ulcers Psychiatric: Mood and affect are normal    Labs on Admission: I have personally reviewed following labs and imaging studies  CBC: Recent Labs  Lab 10/13/2020 2047  WBC 9.2  NEUTROABS 7.2  HGB 13.0  HCT 38.8*  MCV 82.9  PLT 984   Basic Metabolic Panel: Recent Labs  Lab 11/02/2020 2047  NA 126*  K 3.5  CL 94*  CO2 19*  GLUCOSE 127*  BUN 18  CREATININE 1.14  CALCIUM 8.3*   GFR: CrCl cannot be calculated (Unknown ideal weight.). Liver Function Tests: Recent Labs  Lab 10/23/2020 2047  AST 67*  ALT 54*  ALKPHOS 129*  BILITOT 0.7  PROT 8.4*  ALBUMIN 2.7*   No results for input(s): LIPASE, AMYLASE in the last 168 hours. No results for input(s): AMMONIA in the last 168 hours. Coagulation Profile: Recent Labs  Lab 10/13/2020 2047  INR 1.1   Cardiac Enzymes: No results for input(s): CKTOTAL, CKMB, CKMBINDEX, TROPONINI in the last 168 hours. BNP (last 3 results) No results for input(s): PROBNP in the last 8760 hours. HbA1C: No results for input(s): HGBA1C in the last 72 hours. CBG: No results for input(s): GLUCAP in the last 168 hours. Lipid Profile: No results for input(s): CHOL, HDL, LDLCALC, TRIG, CHOLHDL, LDLDIRECT in the last 72 hours. Thyroid Function Tests: No results for input(s): TSH, T4TOTAL, FREET4, T3FREE, THYROIDAB in the last 72 hours. Anemia Panel: No results for input(s): VITAMINB12, FOLATE, FERRITIN, TIBC, IRON, RETICCTPCT in the last 72 hours. Urine analysis:    Component Value Date/Time   COLORURINE YELLOW (A) 11/03/2020 2053   APPEARANCEUR HAZY (A) 10/27/2020 2053   LABSPEC 1.014 10/27/2020 2053   PHURINE 6.0 11/05/2020 2053   GLUCOSEU NEGATIVE 11/08/2020 2053   HGBUR SMALL (A) 10/28/2020 2053   BILIRUBINUR NEGATIVE 10/24/2020 2053   [place] 10/18/2020 2053   PROTEINUR 30 (A) 10/29/2020 2053   NITRITE NEGATIVE 10/15/2020 2053    LEUKOCYTESUR NEGATIVE 11/05/2020 2053    Radiological Exams on Admission: DG Chest Port 1 View  Result Date: 10/13/2020 CLINICAL DATA:  Shortness of breath and fever for approximately 1 week. EXAM: PORTABLE CHEST 1 VIEW COMPARISON:  None. FINDINGS: Heart size is normal. Low lung volumes are seen. Symmetric bilateral pulmonary airspace disease is seen. No evidence of pleural effusion. IMPRESSION: Symmetric bilateral pulmonary airspace disease, which may be due to edema, atypical infection, or less likely hemorrhage. Electronically Signed   By: Marlaine Hind M.D.   On: 11/04/2020 21:13     Assessment/Plan 40 year old male with history of 1 week history of nonproductive cough, shortness of breath and fever.     CAP (community acquired pneumonia)   Sepsis ([place])   Acute respiratory failure with hypoxia ([place]) -Patient presents with shortness of breath and fever - O2  sat 52% on room air requiring HFNC at 31 to maintain sats in the 90s - Chest x-ray with symmetric bilateral pulmonary airspace disease which may be due to edema, atypical infection or less likely hemorrhage. - Procalcitonin elevated at 3.07 - We will get D-dimer to evaluate for PE given differential on chest x-ray and will proceed with CT with very elevated - IV hydration per sepsis protocol - Continue Rocephin and azithromycin    Hyponatremia - Continue to monitor with hydration    Transaminitis - Mild double-digit elevation of transaminases AST/ALT 67/64 - Denies alcohol use.  Drinks beer 1 day a week - Continue to monitor    DVT prophylaxis: Lovenox  Code Status: full code  Family Communication: Wife bedside Disposition Plan: Back to previous home environment Consults called: none  Status:At the time of admission, it appears that the appropriate admission status for this patient is INPATIENT. This is judged to be reasonable and necessary in order to provide the required intensity of service to ensure the patient's safety  given the presenting symptoms, physical exam findings, and initial radiographic and laboratory data in the context of their  Comorbid conditions.   Patient requires inpatient status due to high intensity of service, high risk for further deterioration and high frequency of surveillance required.   I certify that at the point of admission it is my clinical judgment that the patient will require inpatient hospital care spanning beyond [place] MD [facility]     11/07/2020, 1:59 AM

## 2020-11-07 NOTE — Progress Notes (Signed)
Patient admitted to the hospital earlier this morning by Dr. Para March  Patient seen and examined.  He is awake and alert sitting up in bed.  He is currently on heated high flow nasal cannula oxygen at 60 L.  Does seem to have mild increased work of breathing.  He is tachycardic at 120.  Blood pressure is otherwise stable.  Lungs are relatively clear bilaterally.  No lower extremity edema.  Assessment/plan:  Acute respiratory failure with hypoxia -Suspect underlying infectious process -He has been started on intravenous antibiotics -Blood cultures have been sent -We will add urinary antigens for strep pneumo and Legionella -Check sputum culture -HIV panel has been ordered -COVID-19 is negative -Due to his very high oxygen requirement at 60 L, will request PCCM to evaluate since he does appear to be high risk for decompensation -Patient does not report any history of tobacco use or underlying lung disease -He reports being started on Advair/albuterol approximately 3 days ago by his primary care physician  Community-acquired pneumonia -Continue on ceftriaxone and azithromycin -Follow-up cultures  Hyponatremia -Suspect this is related to volume depletion in the setting of infection -Checking Legionella antigen as well and serum osmolarity  Patient is mostly Spanish-speaking, his sister is at the bedside and helps translate during my visit.  [employer]

## 2020-11-07 NOTE — Progress Notes (Signed)
Pharmacy Antibiotic Note  Jordan Gibson is a 40 y.o. male admitted on 11-23-20 with pneumonia.  Pharmacy has been consulted for Vancomycin and Cefepime dosing.  -MRSA PCR pending, PCT 3.07>2.89  -5/27 am: Increasing FiO2 requirements and worsening respiratory status, PCCM consulted due to high risk for deterioration and need for intubation. ABX broadened to Cefepime and Vancomycin, along with Azithromycin   Plan: Vancomycin 1000 IV every 12 hours.  Goal trough 15-20 mcg/mL.   Goal AUC 400-550. Expected AUC: 464 Cmin 12.7 SCr used: 1.03  -Cefepime 2gm IV q8h  F/u renal fxn and MRSA PCR     Height: 5\' 7"  (170.2 cm) Weight: 77.1 kg (170 lb) IBW/kg (Calculated) : 66.1  Temp (24hrs), Avg:100.2 F (37.9 C), Min:98.1 F (36.7 C), Max:103.1 F (39.5 C)  Recent Labs  Lab 2020-11-23 2047 11/07/20 0653  WBC 9.2 8.9  CREATININE 1.14 1.03  LATICACIDVEN 1.9 1.4    Estimated Creatinine Clearance: 88.2 mL/min (by C-G formula based on SCr of 1.03 mg/dL).    No Known Allergies  Antimicrobials this admission: CTX x1  in ED  5/26 (evening) Azithromycin 5/26(evening) >> Vancomycin  5/27 >>   Cefepime  5/27 >>  Dose adjustments this admission:    Microbiology results: 5/26 BCx:  NG <12 hrs 5/26 UCx: pend  5/27 Sputum: needs recollection  5/27 MRSA PCR: pend  Thank you for allowing pharmacy to be a part of this patient's care.  Jahzier Villalon A 11/07/2020 2:39 PM

## 2020-11-07 NOTE — Consult Note (Signed)
NAME:  Jordan Gibson, MRN:  [id number], DOB:  1980-09-20, LOS: 1 ADMISSION DATE:  11/03/2020, CONSULTATION DATE:  11/07/2020 REFERRING MD:  Dr. Roderic Palau, CHIEF COMPLAINT:  Shortness of breath, cough   Brief Pt Description/Synopsis:  40 y.o. Male admitted with Acute Hypoxic Respiratory Failure and Sepsis in the setting of Community Acquired Pneumonia.   History of Present Illness:  Jordan Gibson is a 40 y.o. Male with no significant past medical history who presented to [facility] ED on 11/07/20 with a 1 week history of shortness of breath, nonproductive cough, and fever. He denied chest pain, abdominal pain, Nausea, vomiting, dysuria, lower extremity pain or swelling.  Denies recent travel or sick contacts.  ED Course:  Upon arrival to ED vitals included: fever with Temperature 102, pulse 132, RR 24, O2 sats 52% on room air.  He was placed on HFNC with improvement of O2 sats to the mid 90's.  Workup significant for WBC 9.2, procalcitonin 3.07, lactic acid 1.9, BNP 44, HS Troponin 65, sodium 126.  Urinalysis is unremarkable.  COVID-19 and Influenza PCR's are both negative.  Chest x-ray with symmetric bilateral pulmonary airspace disease concerning for atypical infection vs. Edema vs hemorrhage.  He met sepsis criteria and was started on IV Rocephin and  Azithromycin, along with fluid bolus.  He is to be admitted to [facility] unit by Hospitalist.  While in ED, he continues to require high amounts of supplemental O2 and continues to exhibit moderate respiratory distress.  He is at high risk for respiratory deterioration and need for intubation.  PCCM is consulted for further assistance and management of Acute Hypoxic Respiratory Failure and Sepsis in the setting of Community Acquired Pneumonia.    Pertinent  Medical History  No significant past medical history   Cultures:  10/13/2020: SARS-CoV-2 and influenza PCR>> negative 11/03/2020: Blood culture x2>> 10/27/2020: Urine culture>> 11/07/2020:  Sputum>> 11/07/2020: Strep pneumo urinary antigen>> 11/07/2020: Legionella urinary antigen>> 11/07/2020: Mycoplasma pneumonia>> 11/07/2020: Respiratory viral panel>> 11/07/2020: MRSA PCR>>  Antimicrobials:  Azithromycin 5/26>> Ceftriaxone 5/26>> 5/27 Cefepime 5/27>> Vancomycin 5/27>>  Significant Hospital Events: Including procedures, antibiotic start and stop dates in addition to other pertinent events   . 10/28/2020: Presented to ED, to be admitted to stepdown unit by hospitalist . 11/07/2020: Increasing FiO2 requirements and worsening respiratory status, PCCM consulted due to high risk for deterioration and need for intubation. ABX broadened to Cefepime and Vancomycin, along with Azithromycin  Interim History / Subjective:  -Presented to ED last night -Currently with moderate respiratory distress and tachypnea, diaphoretic, requiring 75% FiO2 via HHFNC;  -Critically ill, High risk for respiratory deterioration and need for intubation -Will broaden ABX to Cefepime and Vancomycin for now given tenuous respiratory status -Pt reports SOB, cough  Objective   Blood pressure 138/81, pulse (!) 122, temperature 98.1 F (36.7 C), temperature source Oral, resp. rate (!) 29, height '5\' 7"'  (1.702 m), weight 77.1 kg, SpO2 92 %.    FiO2 (%):  [60 %-80 %] 75 %   Intake/Output Summary (Last 24 hours) at 11/07/2020 1051 Last data filed at 11/07/2020 5732 Gross per 24 hour  Intake 2335 ml  Output --  Net 2335 ml   Filed Weights   10/27/2020 2040  Weight: 77.1 kg    Examination: General: Acutely ill-appearing male, sitting in bed, on heated high flow nasal cannula at 75% FiO2, with moderate respiratory distress HENT: Atraumatic, normocephalic, neck supple, no JVD Lungs: Clear to auscultation bilaterally, no wheezing or rales noted, tachypnea, accessory  muscle use present Cardiovascular: Tachycardia, regular rhythm, S1-S2, no murmurs, rubs, gallops, 2+ radial and DP pulses Abdomen: Soft, nontender,  nondistended, no guarding rebound tenderness, bowel sounds positive x4 Extremities: Normal bulk and tone, no deformities, no edema Neuro: Awake, alert and oriented x4, moves all extremities to commands, no focal deficits, speech clear, pupils PERRLA GU: Deferred Skin: Warm and diaphoretic.  No obvious rashes, lesions, ulcerations  Labs/imaging that I havepersonally reviewed  (right click and "Reselect all SmartList Selections" daily)  Labs 11/07/2020: Sodium 129, BUN 15, creatinine 1.83, lactic acid 1.4, procalcitonin 2.89, WBC 8.9, hemoglobin 12, BNP 44, high-sensitivity troponin 91, D-dimer 3.87  Chest x-ray 10/27/2020>> Symmetric bilateral pulmonary airspace disease, which may be due to edema, atypical infection, or less likely hemorrhage.  Resolved Hospital Problem list   N/A  Assessment & Plan:   Acute Hypoxic Respiratory Failure in the setting of Community Acquired Pneumonia -Supplemental O2 as needed to maintain O2 sats >92% -Currently on HHFNC @ 75%, high risk for deterioration and intubation -Follow intermittent CXR & ABG as needed -Prn Bronchodilators -Continue Azithromycin, will broaden to Cefepime & Vancomycin for now (given tenuous respiratory status) pending cultures and sensitivities -Aggressive pulmonary toilet -BNP 44, will still obtain Echocardiogram per discussion with Dr. Lanney Gins -D-dimer 3, will check Venous US, consider CTA Chest vs. Lung V/Q scan (PE seems less likely)  Sepsis in the setting of Community Acquired Pneumonia -Monitor fever curve -Trend WBC's & Procalcitonin -Follow cultures as above -Check Strep pneumo urinary antigen, legionella, mycoplasma pneumoniae, Respiratory Viral Panel, MRSA PCR, sputum culture -Continue Azithromycin, will broaden from Rocephin to Cefepime & Vancomycin for now (given tenuous respiratory status) pending cultures and sensitivities  Hyponatremia, suspect Hypotonic Hypovolemic -Monitor I&O's / urinary output -Follow  BMP -Ensure adequate renal perfusion -Avoid nephrotoxic agents as able -Replace electrolytes as indicated -IV fluids -Serum osmolarity pending   Pt's respiratory status is very tenuous.  High risk for deterioration, need for intubation, cardiac arrest and death.  Prognosis is guarded.  Best practice (right click and "Reselect all SmartList Selections" daily)  Diet:  Oral Pain/Anxiety/Delirium protocol (if indicated): No VAP protocol (if indicated): Not indicated DVT prophylaxis: LMWH GI prophylaxis: N/A Glucose control:  SSI No Central venous access:  N/A Arterial line:  N/A Foley:  N/A Mobility:  bed rest  PT consulted: N/A Last date of multidisciplinary goals of care discussion [N/A] Code Status:  full code Disposition: Stepdown  Labs   CBC: Recent Labs  Lab 10/26/2020 2047 11/07/20 0653  WBC 9.2 8.9  NEUTROABS 7.2  --   HGB 13.0 12.0*  HCT 38.8* 35.1*  MCV 82.9 82.8  PLT 324 892    Basic Metabolic Panel: Recent Labs  Lab 10/27/2020 2047 11/07/20 0653  NA 126* 129*  K 3.5 4.1  CL 94* 97*  CO2 19* 22  GLUCOSE 127* 92  BUN 18 15  CREATININE 1.14 1.03  CALCIUM 8.3* 8.0*   GFR: Estimated Creatinine Clearance: 88.2 mL/min (by C-G formula based on SCr of 1.03 mg/dL). Recent Labs  Lab 11/10/2020 2047 11/05/2020 2357 11/07/20 0653  PROCALCITON  --  3.07 2.89  WBC 9.2  --  8.9  LATICACIDVEN 1.9  --  1.4    Liver Function Tests: Recent Labs  Lab 11/11/2020 2047  AST 67*  ALT 54*  ALKPHOS 129*  BILITOT 0.7  PROT 8.4*  ALBUMIN 2.7*   No results for input(s): LIPASE, AMYLASE in the last 168 hours. No results for input(s): AMMONIA in the last  168 hours.  ABG    Component Value Date/Time   PHART 7.50 (H) 10/17/2020 2317   PCO2ART 27 (L) 11/03/2020 2317   PO2ART 108 11/10/2020 2317   HCO3 21.1 11/11/2020 2317   ACIDBASEDEF 0.7 10/16/2020 2317   O2SAT 98.6 11/02/2020 2317     Coagulation Profile: Recent Labs  Lab 11/02/2020 2047 11/07/20 0653  INR  1.1 1.2    Cardiac Enzymes: No results for input(s): CKTOTAL, CKMB, CKMBINDEX, TROPONINI in the last 168 hours.  HbA1C: No results found for: HGBA1C  CBG: No results for input(s): GLUCAP in the last 168 hours.  Review of Systems:   Positives in BOLD: Gen: Denies fever, chills, weight change, fatigue, night sweats HEENT: Denies blurred vision, double vision, hearing loss, tinnitus, sinus congestion, rhinorrhea, sore throat, neck stiffness, dysphagia PULM: Denies shortness of breath, cough, sputum production, hemoptysis, wheezing CV: Denies chest pain, edema, orthopnea, paroxysmal nocturnal dyspnea, palpitations GI: Denies abdominal pain, nausea, vomiting, diarrhea, hematochezia, melena, constipation, change in bowel habits GU: Denies dysuria, hematuria, polyuria, oliguria, urethral discharge Endocrine: Denies hot or cold intolerance, polyuria, polyphagia or appetite change Derm: Denies rash, dry skin, scaling or peeling skin change Heme: Denies easy bruising, bleeding, bleeding gums Neuro: Denies headache, numbness, weakness, slurred speech, loss of memory or consciousness   Past Medical History:  He,  has no past medical history on file.   Surgical History:  History reviewed. No pertinent surgical history.   Social History:   reports that he has never smoked. He has never used smokeless tobacco. He reports current alcohol use of about 1.0 standard drink of alcohol per week.   Family History:  His family history is not on file.   Allergies No Known Allergies   Home Medications  Prior to Admission medications   Medication Sig Start Date End Date Taking? Authorizing Provider  acetaminophen (TYLENOL) 325 MG tablet Take 650 mg by mouth every 6 (six) hours as needed.   Yes [provider]  albuterol (VENTOLIN HFA) 108 (90 Base) MCG/ACT inhaler Inhale 2 puffs into the lungs every 4 (four) hours as needed. 10/31/20  Yes [provider]  fluticasone-salmeterol  (ADVAIR) 100-50 MCG/ACT AEPB Inhale 1 puff into the lungs 2 (two) times daily. 11/03/20  Yes [provider]  Vitamin D, Ergocalciferol, (DRISDOL) 1.25 MG (50000 UNIT) CAPS capsule Take 1 capsule by mouth once a week. 10/31/20  Yes [provider]     Critical care time: 50 minutes    Darel Hong, AGACNP-BC [facility] epic messenger for cross cover needs If after hours, please call E-link

## 2020-11-07 NOTE — Sepsis Progress Note (Signed)
Following sepsis monitoring

## 2020-11-08 ENCOUNTER — Encounter: Payer: Self-pay | Admitting: Internal Medicine

## 2020-11-08 ENCOUNTER — Inpatient Hospital Stay (HOSPITAL_COMMUNITY)
Admit: 2020-11-08 | Discharge: 2020-11-08 | Disposition: A | Discharge status: Home / Self Care | Payer: Medicaid Other | Attending: Pulmonary Disease | Admitting: Pulmonary Disease

## 2020-11-08 ENCOUNTER — Inpatient Hospital Stay: Payer: Medicaid Other

## 2020-11-08 ENCOUNTER — Inpatient Hospital Stay: Admit: 2020-11-08 | Payer: Medicaid Other

## 2020-11-08 DIAGNOSIS — R0603 Acute respiratory distress: Secondary | ICD-10-CM

## 2020-11-08 LAB — PHOSPHORUS: Phosphorus: 5.9 mg/dL — ABNORMAL HIGH (ref 2.5–4.6)

## 2020-11-08 LAB — BLOOD GAS, ARTERIAL
Acid-base deficit: 5 mmol/L — ABNORMAL HIGH (ref 0.0–2.0)
Acid-base deficit: 3.2 mmol/L — ABNORMAL HIGH (ref 0.0–2.0)
Bicarbonate: 24.4 mmol/L (ref 20.0–28.0)
Bicarbonate: 23.6 mmol/L (ref 20.0–28.0)
FIO2: 100
FIO2: 100
MECHVT: 450 mL
MECHVT: 500 mL
Mechanical Rate: 24
O2 Saturation: 89 %
O2 Saturation: 99.8 %
PEEP: 10 cmH2O
PEEP: 15 cmH2O
Patient temperature: 37
Patient temperature: 37
RATE: 24 resp/min
pCO2 arterial: 64 mmHg — ABNORMAL HIGH (ref 32.0–48.0)
pCO2 arterial: 48 mmHg (ref 32.0–48.0)
pH, Arterial: 7.19 — CL (ref 7.350–7.450)
pH, Arterial: 7.3 — ABNORMAL LOW (ref 7.350–7.450)
pO2, Arterial: 70 mmHg — ABNORMAL LOW (ref 83.0–108.0)
pO2, Arterial: 239 mmHg — ABNORMAL HIGH (ref 83.0–108.0)

## 2020-11-08 LAB — BASIC METABOLIC PANEL
Anion gap: 9 (ref 5–15)
BUN: 15 mg/dL (ref 6–20)
CO2: 24 mmol/L (ref 22–32)
Calcium: 8 mg/dL — ABNORMAL LOW (ref 8.9–10.3)
Chloride: 97 mmol/L — ABNORMAL LOW (ref 98–111)
Creatinine, Ser: 0.99 mg/dL (ref 0.61–1.24)
GFR, Estimated: >60 mL/min (ref >60)
Glucose, Bld: 109 mg/dL — ABNORMAL HIGH (ref 70–99)
Potassium: 3.7 mmol/L (ref 3.5–5.1)
Sodium: 130 mmol/L — ABNORMAL LOW (ref 135–145)

## 2020-11-08 LAB — URINE CULTURE: Culture: NO GROWTH

## 2020-11-08 LAB — OSMOLALITY: Osmolality: 286 mOsm/kg (ref 275–295)

## 2020-11-08 LAB — CBC
HCT: 38.8 % — ABNORMAL LOW (ref 39.0–52.0)
Hemoglobin: 12.5 g/dL — ABNORMAL LOW (ref 13.0–17.0)
MCH: 27.7 pg (ref 26.0–34.0)
MCHC: 32.2 g/dL (ref 30.0–36.0)
MCV: 86 fL (ref 80.0–100.0)
Platelets: 303 10*3/uL (ref 150–400)
RBC: 4.51 MIL/uL (ref 4.22–5.81)
RDW: 13.6 % (ref 11.5–15.5)
WBC: 12.5 10*3/uL — ABNORMAL HIGH (ref 4.0–10.5)
nRBC: 0 % (ref 0.0–0.2)

## 2020-11-08 LAB — ECHOCARDIOGRAM COMPLETE
AR max vel: 2.67 cm2
AV Peak grad: 5.9 mmHg
Ao pk vel: 1.21 m/s
Area-P 1/2: 3.79 cm2
Height: 67 in
S' Lateral: 2.66 cm
Weight: 2825.42 oz

## 2020-11-08 LAB — GLUCOSE, CAPILLARY
Glucose-Capillary: 113 mg/dL — ABNORMAL HIGH (ref 70–99)
Glucose-Capillary: 92 mg/dL (ref 70–99)
Glucose-Capillary: 112 mg/dL — ABNORMAL HIGH (ref 70–99)
Glucose-Capillary: 163 mg/dL — ABNORMAL HIGH (ref 70–99)
Glucose-Capillary: 161 mg/dL — ABNORMAL HIGH (ref 70–99)
Glucose-Capillary: 154 mg/dL — ABNORMAL HIGH (ref 70–99)
Glucose-Capillary: 148 mg/dL — ABNORMAL HIGH (ref 70–99)

## 2020-11-08 LAB — BRAIN NATRIURETIC PEPTIDE: B Natriuretic Peptide: 55.7 pg/mL (ref 0.0–100.0)

## 2020-11-08 LAB — PROCALCITONIN: Procalcitonin: 4.56 ng/mL

## 2020-11-08 LAB — RESP PANEL BY RT-PCR (FLU A&B, COVID) ARPGX2
Influenza A by PCR: NEGATIVE
Influenza B by PCR: NEGATIVE
SARS Coronavirus 2 by RT PCR: NEGATIVE

## 2020-11-08 LAB — MAGNESIUM: Magnesium: 2.1 mg/dL (ref 1.7–2.4)

## 2020-11-08 LAB — PROTIME-INR
INR: 1.3 — ABNORMAL HIGH (ref 0.8–1.2)
Prothrombin Time: 15.8 seconds — ABNORMAL HIGH (ref 11.4–15.2)

## 2020-11-08 LAB — C-REACTIVE PROTEIN: CRP: 31.9 mg/dL — ABNORMAL HIGH (ref <1.0)

## 2020-11-08 LAB — LACTIC ACID, PLASMA: Lactic Acid, Venous: 1.7 mmol/L (ref 0.5–1.9)

## 2020-11-08 MED ORDER — HYDROCODONE-ACETAMINOPHEN 5-325 MG PO TABS
1.0000 | ORAL_TABLET | ORAL | Status: DC | PRN
  Administered 2020-11-10 – 2020-11-11 (×5): 1
  Filled 2020-11-08 (×2): qty 1
  Filled 2020-11-08: qty 2
  Filled 2020-11-08 (×3): qty 1

## 2020-11-08 MED ORDER — PROPOFOL 1000 MG/100ML IV EMUL
0.0000 ug/kg/min | INTRAVENOUS | Status: DC
  Administered 2020-11-08: 35 ug/kg/min via INTRAVENOUS
  Administered 2020-11-08 (×2): 40 ug/kg/min via INTRAVENOUS
  Administered 2020-11-08: 5 ug/kg/min via INTRAVENOUS
  Administered 2020-11-08: 50 ug/kg/min via INTRAVENOUS
  Administered 2020-11-09: 30 ug/kg/min via INTRAVENOUS
  Administered 2020-11-09: 40 ug/kg/min via INTRAVENOUS
  Filled 2020-11-08 (×7): qty 100

## 2020-11-08 MED ORDER — ACETAMINOPHEN 325 MG PO TABS
650.0000 mg | ORAL_TABLET | ORAL | Status: DC | PRN
  Administered 2020-11-08: 650 mg via ORAL

## 2020-11-08 MED ORDER — FENTANYL CITRATE (PF) 100 MCG/2ML IJ SOLN
100.0000 ug | Freq: Once | INTRAMUSCULAR | Status: AC
  Administered 2020-11-08: 100 ug via INTRAVENOUS

## 2020-11-08 MED ORDER — METHYLPREDNISOLONE SODIUM SUCC 40 MG IJ SOLR
40.0000 mg | Freq: Two times a day (BID) | INTRAMUSCULAR | Status: DC
  Administered 2020-11-08 – 2020-11-10 (×5): 40 mg via INTRAVENOUS
  Filled 2020-11-08 (×5): qty 1

## 2020-11-08 MED ORDER — ORAL CARE MOUTH RINSE
15.0000 mL | OROMUCOSAL | Status: DC
  Administered 2020-11-08 – 2020-11-09 (×13): 15 mL via OROMUCOSAL

## 2020-11-08 MED ORDER — FENTANYL CITRATE (PF) 100 MCG/2ML IJ SOLN: 50.0000 ug | Freq: Once | INTRAMUSCULAR | Status: DC

## 2020-11-08 MED ORDER — ACETAMINOPHEN 650 MG RE SUPP
650.0000 mg | Freq: Four times a day (QID) | RECTAL | Status: DC | PRN
  Filled 2020-11-08: qty 1

## 2020-11-08 MED ORDER — ETOMIDATE 2 MG/ML IV SOLN
20.0000 mg | Freq: Once | INTRAVENOUS | Status: AC
  Administered 2020-11-08: 20 mg via INTRAVENOUS
  Filled 2020-11-08: qty 10

## 2020-11-08 MED ORDER — SODIUM CHLORIDE 0.9 % IV SOLN
250.0000 mL | INTRAVENOUS | Status: DC
  Administered 2020-11-08 – 2020-11-23 (×6): 250 mL via INTRAVENOUS

## 2020-11-08 MED ORDER — IOHEXOL 350 MG/ML SOLN
100.0000 mL | Freq: Once | INTRAVENOUS | Status: AC | PRN
  Administered 2020-11-08: 100 mL via INTRAVENOUS

## 2020-11-08 MED ORDER — POLYETHYLENE GLYCOL 3350 17 G PO PACK
17.0000 g | PACK | Freq: Every day | ORAL | Status: DC
  Administered 2020-11-08: 17 g
  Filled 2020-11-08: qty 1

## 2020-11-08 MED ORDER — FENTANYL BOLUS VIA INFUSION
50.0000 ug | INTRAVENOUS | Status: DC | PRN
  Administered 2020-11-08: 100 ug via INTRAVENOUS
  Filled 2020-11-08: qty 100

## 2020-11-08 MED ORDER — MIDAZOLAM HCL 2 MG/2ML IJ SOLN
2.0000 mg | INTRAMUSCULAR | Status: DC | PRN
  Administered 2020-11-08 – 2020-11-09 (×3): 2 mg via INTRAVENOUS
  Filled 2020-11-08 (×2): qty 2

## 2020-11-08 MED ORDER — MIDAZOLAM HCL 2 MG/2ML IJ SOLN
INTRAMUSCULAR | Status: AC
  Administered 2020-11-08: 2 mg
  Filled 2020-11-08: qty 4

## 2020-11-08 MED ORDER — MIDAZOLAM HCL 2 MG/2ML IJ SOLN
4.0000 mg | Freq: Once | INTRAMUSCULAR | Status: DC
  Filled 2020-11-08: qty 4

## 2020-11-08 MED ORDER — MIDAZOLAM HCL 2 MG/2ML IJ SOLN
4.0000 mg | Freq: Once | INTRAMUSCULAR | Status: AC
  Administered 2020-11-08: 4 mg via INTRAVENOUS

## 2020-11-08 MED ORDER — MIDAZOLAM HCL 2 MG/2ML IJ SOLN
2.0000 mg | INTRAMUSCULAR | Status: AC | PRN
  Administered 2020-11-08 (×3): 2 mg via INTRAVENOUS
  Filled 2020-11-08 (×3): qty 2

## 2020-11-08 MED ORDER — FUROSEMIDE 10 MG/ML IJ SOLN
INTRAMUSCULAR | Status: AC
  Administered 2020-11-08: 40 mg
  Filled 2020-11-08: qty 4

## 2020-11-08 MED ORDER — NOREPINEPHRINE 4 MG/250ML-% IV SOLN
INTRAVENOUS | Status: AC
  Administered 2020-11-08: 2 ug/min via INTRAVENOUS
  Filled 2020-11-08: qty 250

## 2020-11-08 MED ORDER — ROCURONIUM BROMIDE 50 MG/5ML IV SOLN
100.0000 mg | Freq: Once | INTRAVENOUS | Status: AC
  Administered 2020-11-08: 50 mg via INTRAVENOUS

## 2020-11-08 MED ORDER — NOREPINEPHRINE 4 MG/250ML-% IV SOLN
2.0000 ug/min | INTRAVENOUS | Status: DC
  Administered 2020-11-08: 2 ug/min via INTRAVENOUS
  Filled 2020-11-08: qty 250

## 2020-11-08 MED ORDER — VECURONIUM BROMIDE 10 MG IV SOLR
10.0000 mg | INTRAVENOUS | Status: DC | PRN
  Administered 2020-11-08 (×2): 10 mg via INTRAVENOUS
  Filled 2020-11-08 (×2): qty 10

## 2020-11-08 MED ORDER — FENTANYL 2500MCG IN NS 250ML (10MCG/ML) PREMIX INFUSION
50.0000 ug/h | INTRAVENOUS | Status: DC
  Administered 2020-11-08: 200 ug/h via INTRAVENOUS
  Administered 2020-11-08: 50 ug/h via INTRAVENOUS
  Administered 2020-11-09: 175 ug/h via INTRAVENOUS
  Filled 2020-11-08 (×3): qty 250

## 2020-11-08 MED ORDER — DOCUSATE SODIUM 50 MG/5ML PO LIQD
100.0000 mg | Freq: Two times a day (BID) | ORAL | Status: DC
  Administered 2020-11-08 (×3): 100 mg
  Filled 2020-11-08 (×3): qty 10

## 2020-11-08 MED ORDER — ROCURONIUM BROMIDE 50 MG/5ML IV SOLN
INTRAVENOUS | Status: AC
  Filled 2020-11-08: qty 2

## 2020-11-08 MED ORDER — METOPROLOL TARTRATE 5 MG/5ML IV SOLN
5.0000 mg | Freq: Once | INTRAVENOUS | Status: AC
  Administered 2020-11-08: 5 mg via INTRAVENOUS
  Filled 2020-11-08: qty 5

## 2020-11-08 MED ORDER — FAMOTIDINE IN NACL 20-0.9 MG/50ML-% IV SOLN
20.0000 mg | Freq: Two times a day (BID) | INTRAVENOUS | Status: DC
  Administered 2020-11-08 – 2020-11-10 (×6): 20 mg via INTRAVENOUS
  Filled 2020-11-08 (×6): qty 50

## 2020-11-08 MED ORDER — FENTANYL CITRATE (PF) 100 MCG/2ML IJ SOLN
INTRAMUSCULAR | Status: AC
  Filled 2020-11-08: qty 2

## 2020-11-08 MED ORDER — CHLORHEXIDINE GLUCONATE 0.12% ORAL RINSE (MEDLINE KIT)
15.0000 mL | Freq: Two times a day (BID) | OROMUCOSAL | Status: DC
  Administered 2020-11-08 – 2020-11-09 (×3): 15 mL via OROMUCOSAL

## 2020-11-08 MED ORDER — FUROSEMIDE 10 MG/ML IJ SOLN: 40.0000 mg | Freq: Once | INTRAMUSCULAR | Status: AC

## 2020-11-08 NOTE — Progress Notes (Signed)
Events from overnight noted. Patient intubated for respiratory failure. Now ICU level of care under critical care service. [facility] will sign off at this point. We will be happy to reassume care once patient is extubated and further stabilized. Please feel free to call back if we can be of any assistance. We appreciate the excellent care provided to Jordan Gibson by the critical care team.  Erick Blinks

## 2020-11-08 NOTE — Progress Notes (Signed)
Initial Nutrition Assessment  RD working remotely.  DOCUMENTATION CODES:   Not applicable  INTERVENTION:   When medically able, recommend initiation of enteral nutrition via NG tube and slow titration to goal: - Start Vital 1.5 @ 25 ml/hr and advance by 10 ml q 8 hours to goal rate of 55 ml/hr (1320 ml/day) - ProSource TF 45 ml BID  Recommended tube feeding regimen at goal rate would provide 2060 kcal, 111 grams of protein, and 1008 ml of H2O.   NUTRITION DIAGNOSIS:   Inadequate oral intake related to inability to eat as evidenced by NPO status.  GOAL:   Patient will meet greater than or equal to 90% of their needs  MONITOR:   Vent status,Labs,Weight trends,I & O's  REASON FOR ASSESSMENT:   Malnutrition Screening Tool,Ventilator    ASSESSMENT:   40 year old male who presented to the ED on 5/26 with SOB and fever. No significant PMH. Pt admitted with CAP, sepsis.   5/28 - intubated  Pt with NG tube in place and tip overlying distal stomach, currently clamped. Abdominal x-ray this morning showing "multiple dilated loops of small bowel in the upper abdomen either due to obstruction or ileus."  RD will leave tube feeding recommendations. If tube feeds are desired, would recommend starting at trickle rate with slow advancement to goal given abdominal x-ray findings.  Per RN edema assessment, pt with non-pitting edema to BUE.  Patient is currently intubated on ventilator support MV: 9.4 L/min Temp (24hrs), Avg:100.3 F (37.9 C), Min:96.98 F (36.1 C), Max:103.1 F (39.5 C) BP (cuff): 102/66 MAP (cuff): 78  Drips: Propofol: 19.2 ml/hr (provides 507 kcal daily from lipid) Fentanyl Levophed  Medications reviewed and include: colace, IV solu-medrol, miralax, IV abx, IV pepcid  Labs reviewed: sodium 130, phosphorus 5.9 CBG's: 92-163  UOP: 2640 ml x 24 hours I/O's: +629 ml since admit  NUTRITION - FOCUSED PHYSICAL EXAM:  Unable to complete at this time. RD  working remotely.  Diet Order:   Diet Order            Diet NPO time specified  Diet effective now                 EDUCATION NEEDS:   No education needs have been identified at this time  Skin:  Skin Assessment: Reviewed RN Assessment  Last BM:  11/07/20  Height:   Ht Readings from Last 1 Encounters:  11/07/20 5\' 7"  (1.702 m)    Weight:   Wt Readings from Last 1 Encounters:  11/07/20 80.1 kg    BMI:  Body mass index is 27.66 kg/m.  Estimated Nutritional Needs:   Kcal:  2050  Protein:  110-130 grams  Fluid:  >/= 2.0 L    2051, MS, RD, LDN Inpatient Clinical Dietitian Please see AMiON for contact information.

## 2020-11-08 NOTE — Progress Notes (Signed)
RT at bedside and adjusted ETT cuff. Pt now is synchronous.

## 2020-11-08 NOTE — Progress Notes (Signed)
Pt responds to pain, unable to wean to RASS goal at this time due to ventilator dyssynchrony. PRN versed effective for stacked breaths/ ventilator dyssynchrony. Family updated by MD. Warming blanket applied for hypothermia. Gtts tirated per orders. Family sitting outside of pt door visiting. Pt phone cleaned and given to pt sister Cheri Kearns. Will continue to monitor.

## 2020-11-08 NOTE — Progress Notes (Signed)
Unable to wean sedation due to patient WOB. Levophed started for systolic in the 80s. Britton-Lee, NP made aware. Vecuronium ordered and given. WOB improved but peak pressures doubled. NP made aware.

## 2020-11-08 NOTE — Progress Notes (Signed)
Patient attempting to use the urinal and HR increased to the 150s, sats 67%. NRB placed over HHF in prep for bipap and sats increased. Patient respiratory status worsened on bipap mask. Britton-Lee, NP made aware and came to bedside. Decision made to intubate patient at that time. Intubation completed at 0107. Patient HR persisted in the 150s, BP 180s/110s even on sedation and patient was extremely diaphoretic. BG WDL. Britton-Lee back at the bedside. 5mg  Lopressor given with desired effect. Pt prepped for STAT CT scan. Lying flat, patient had episode of desaturation. Will continue to monitor.

## 2020-11-08 NOTE — Plan of Care (Signed)
  Problem: Education: Goal: Knowledge of General Education information will improve Description: Including pain rating scale, medication(s)/side effects and non-pharmacologic comfort measures Outcome: Not Progressing   Problem: Health Behavior/Discharge Planning: Goal: Ability to manage health-related needs will improve Outcome: Not Progressing   Problem: Clinical Measurements: Goal: Ability to maintain clinical measurements within normal limits will improve Outcome: Not Progressing Goal: Will remain free from infection Outcome: Not Progressing Goal: Diagnostic test results will improve Outcome: Not Progressing Goal: Respiratory complications will improve Outcome: Not Progressing Goal: Cardiovascular complication will be avoided Outcome: Not Progressing   Problem: Activity: Goal: Risk for activity intolerance will decrease Outcome: Not Progressing   Problem: Nutrition: Goal: Adequate nutrition will be maintained Outcome: Not Progressing   Problem: Coping: Goal: Level of anxiety will decrease Outcome: Not Progressing   Problem: Elimination: Goal: Will not experience complications related to bowel motility Outcome: Not Progressing Goal: Will not experience complications related to urinary retention Outcome: Not Progressing   Problem: Pain Managment: Goal: General experience of comfort will improve Outcome: Not Progressing   Problem: Safety: Goal: Ability to remain free from injury will improve Outcome: Not Progressing   Problem: Skin Integrity: Goal: Risk for impaired skin integrity will decrease Outcome: Not Progressing   Problem: Activity: Goal: Ability to tolerate increased activity will improve Outcome: Not Progressing   Problem: Clinical Measurements: Goal: Ability to maintain a body temperature in the normal range will improve Outcome: Not Progressing   Problem: Respiratory: Goal: Ability to maintain adequate ventilation will improve Outcome: Not  Progressing Goal: Ability to maintain a clear airway will improve Outcome: Not Progressing   

## 2020-11-08 NOTE — Progress Notes (Signed)
Pt RR 35-40 despite prn versed and fentanyl bolus. PRN vecuronium not effective. MD made aware. Will continue to monitor.

## 2020-11-08 NOTE — Progress Notes (Signed)
Patient arrived to ICU from the ED on NRB. Pt then switched to HHF 60L/85%. Sats were in the low 90s. RR upper 30s to low 40s. Patient alert and oriented. Denies any pain. Britton-Lee, NP made aware of patient condition. LR 125/hr questioned for concerns of pulmonary edema. Will continue to monitor.

## 2020-11-08 NOTE — Progress Notes (Signed)
Chaplain Maggie made introduction to family sitting outside of patient's room. Space was made for empathetic listening and prayer. Chaplain available for continued spiritual support as needed.

## 2020-11-08 NOTE — Progress Notes (Signed)
eLink Physician-Brief Progress Note Patient Name: Governor Matos DOB: 03-16-1981 MRN: [id number]   Date of Service  11/08/2020  HPI/Events of Note  Patient admitted with bilateral pneumonia, transferred to the ICU due to acute hypoxemic respiratory failure suspected to be due to a combination of severe bilateral pneumonia and pulmonary edema, work up and treatment are in progress.  eICU Interventions  New Patient Evaluation.  BNP ordered to screen for volume overload / pulmonary edema.        Thomasene Lot Eniya Cannady 11/08/2020, 4:55 AM

## 2020-11-08 NOTE — Progress Notes (Signed)
NAME:  Jordan Gibson, MRN:  [id number], DOB:  05/12/1981, LOS: 2 ADMISSION DATE:  10/26/2020, CONSULTATION DATE:  11/07/2020 REFERRING MD:  Dr. Roderic Palau, CHIEF COMPLAINT:  Shortness of breath, cough   Brief Pt Description/Synopsis:  40 y.o. Male admitted with Acute Hypoxic Respiratory Failure and Sepsis in the setting of Community Acquired Pneumonia.   History of Present Illness:  Jordan Gibson is a 40 y.o. Male with no significant past medical history who presented to [facility] ED on 11/07/20 with a 1 week history of shortness of breath, nonproductive cough, and fever. He denied chest pain, abdominal pain, Nausea, vomiting, dysuria, lower extremity pain or swelling.  Denies recent travel or sick contacts.  ED Course:  Upon arrival to ED vitals included: fever with Temperature 102, pulse 132, RR 24, O2 sats 52% on room air.  He was placed on HFNC with improvement of O2 sats to the mid 90's.  Workup significant for WBC 9.2, procalcitonin 3.07, lactic acid 1.9, BNP 44, HS Troponin 65, sodium 126.  Urinalysis is unremarkable.  COVID-19 and Influenza PCR's are both negative.  Chest x-ray with symmetric bilateral pulmonary airspace disease concerning for atypical infection vs. Edema vs hemorrhage.  He met sepsis criteria and was started on IV Rocephin and  Azithromycin, along with fluid bolus.  He is to be admitted to [facility] unit by Hospitalist.  While in ED, he continues to require high amounts of supplemental O2 and continues to exhibit moderate respiratory distress.  He is at high risk for respiratory deterioration and need for intubation.  PCCM is consulted for further assistance and management of Acute Hypoxic Respiratory Failure and Sepsis in the setting of Community Acquired Pneumonia.     Cultures:  11/01/2020: SARS-CoV-2 and influenza PCR>> negative 11/01/2020: Blood culture x2>> 10/30/2020: Urine culture>> 11/07/2020: Sputum>> 11/07/2020: Strep pneumo urinary antigen>> 11/07/2020:  Legionella urinary antigen>> 11/07/2020: Mycoplasma pneumonia>> 11/07/2020: Respiratory viral panel>> 11/07/2020: MRSA PCR>>  Antimicrobials:  Azithromycin 5/26>> Ceftriaxone 5/26>> 5/27 Cefepime 5/27>> Vancomycin 5/27>>  Significant Hospital Events: Including procedures, antibiotic start and stop dates in addition to other pertinent events   . 11/08/2020: Presented to ED, to be admitted to stepdown unit by hospitalist . 11/07/2020: Increasing FiO2 requirements and worsening respiratory status, PCCM consulted due to high risk for deterioration and need for intubation. ABX broadened to Cefepime and Vancomycin, along with Azithromycin . 11/08/20- patient with respiratory failure s/p ETT overnight. I met with family at bedside today and reviewed care plan.  Vancomycin dcd today MRSA pcr negative.     Objective   Blood pressure 108/70, pulse 69, temperature (!) 96.98 F (36.1 C), temperature source Rectal, resp. rate 18, height _0  (1.702 m), weight 80.1 kg, SpO2 100 %.    Vent Mode: PRVC FiO2 (%):  [60 %-100 %] 60 % Set Rate:  [24 bmp] 24 bmp Vt Set:  [450 mL-500 mL] 500 mL PEEP:  [10 cmH20-15 cmH20] 10 cmH20 Plateau Pressure:  [25 RDE08-14 cmH20] 25 cmH20   Intake/Output Summary (Last 24 hours) at 11/08/2020 1216 Last data filed at 11/08/2020 1205 Gross per 24 hour  Intake 1784.73 ml  Output 3590 ml  Net -1805.27 ml   Filed Weights   10/16/2020 2040 11/07/20 2000  Weight: 77.1 kg 80.1 kg    Examination:  General:  Intubated on MV HENT: Atraumatic, normocephalic, neck supple, no JVD Lungs: rhonch bilateraly Cardiovascular: Tachycardia, regular rhythm, S1-S2, no murmurs, rubs, gallops, 2+ radial and DP pulses Abdomen: Soft, nontender, nondistended, no guarding  rebound tenderness, bowel sounds positive x4 Extremities: Normal bulk and tone, no deformities, no edema Neuro: GS4T on ventilator GU: Deferred Skin: Warm and diaphoretic.  No obvious rashes, lesions,  ulcerations    Resolved Hospital Problem list   N/A   Assessment & Plan:   Acute Hypoxic Respiratory Failure in the setting of SEVERE ARDS due to Community Acquired Pneumonia -on MV -Tracheal aspriate -MRSA negative - dcd vancomycin -weaned FiO2 from 100% to 50% RR18 peep 10 -tracheal aspirate -RVP pending -additional microbiology in process -fungal and bacterial    Sepsis in the setting of Community Acquired Pneumonia -Monitor fever curve -Trend WBC's & Procalcitonin -Follow cultures as above -Check Strep pneumo urinary antigen, legionella, mycoplasma pneumoniae, Respiratory Viral Panel, MRSA PCR, sputum culture -Continue Azithromycin, will broaden from Rocephin to Cefepime & Vancomycin for now (given tenuous respiratory status) pending cultures and sensitivities  Hyponatremia, suspect Hypotonic Hypovolemic -Monitor I&O's / urinary output -Follow BMP -Ensure adequate renal perfusion -Avoid nephrotoxic agents as able -Replace electrolytes as indicated -IV fluids -Serum osmolarity pending   Pt's respiratory status is very tenuous.  High risk for deterioration, need for intubation, cardiac arrest and death.  Prognosis is guarded.  Best practice (right click and "Reselect all SmartList Selections" daily)  Diet:  Oral Pain/Anxiety/Delirium protocol (if indicated): No VAP protocol (if indicated): Not indicated DVT prophylaxis: LMWH GI prophylaxis: N/A Glucose control:  SSI No Central venous access:  N/A Arterial line:  N/A Foley:  N/A Mobility:  bed rest  PT consulted: N/A Last date of multidisciplinary goals of care discussion [N/A] Code Status:  full code Disposition: Stepdown  Labs   CBC: Recent Labs  Lab 11/04/2020 2047 11/07/20 0653 11/08/20 0313  WBC 9.2 8.9 12.5*  NEUTROABS 7.2  --   --   HGB 13.0 12.0* 12.5*  HCT 38.8* 35.1* 38.8*  MCV 82.9 82.8 86.0  PLT 324 268 657    Basic Metabolic Panel: Recent Labs  Lab 10/31/2020 2047 11/07/20 0653  11/08/20 0313  NA 126* 129* 130*  K 3.5 4.1 3.7  CL 94* 97* 97*  CO2 19* 22 24  GLUCOSE 127* 92 109*  BUN _0 CREATININE 1.14 1.03 0.99  CALCIUM 8.3* 8.0* 8.0*  MG  --   --  2.1  PHOS  --   --  5.9*   GFR: Estimated Creatinine Clearance: 99.6 mL/min (by C-G formula based on SCr of 0.99 mg/dL). Recent Labs  Lab 10/30/2020 2047 10/22/2020 2357 11/07/20 0653 11/08/20 0313  PROCALCITON  --  3.07 2.89 4.56  WBC 9.2  --  8.9 12.5*  LATICACIDVEN 1.9  --  1.4 1.7    Liver Function Tests: Recent Labs  Lab 10/12/2020 2047  AST 67*  ALT 54*  ALKPHOS 129*  BILITOT 0.7  PROT 8.4*  ALBUMIN 2.7*   No results for input(s): LIPASE, AMYLASE in the last 168 hours. No results for input(s): AMMONIA in the last 168 hours.  ABG    Component Value Date/Time   PHART 7.30 (L) 11/08/2020 0500   PCO2ART 48 11/08/2020 0500   PO2ART 239 (H) 11/08/2020 0500   HCO3 23.6 11/08/2020 0500   ACIDBASEDEF 3.2 (H) 11/08/2020 0500   O2SAT 99.8 11/08/2020 0500     Coagulation Profile: Recent Labs  Lab 10/22/2020 2047 11/07/20 0653 11/08/20 0313  INR 1.1 1.2 1.3*    Cardiac Enzymes: No results for input(s): CKTOTAL, CKMB, CKMBINDEX, TROPONINI in the last 168 hours.  HbA1C: No results found for: HGBA1C  CBG: Recent Labs  Lab 11/07/20 2029 11/08/20 0140 11/08/20 0432 11/08/20 0802 11/08/20 1156  GLUCAP 104* 113* 92 112* 163*    Review of Systems:   Positives in BOLD: Gen: Denies fever, chills, weight change, fatigue, night sweats HEENT: Denies blurred vision, double vision, hearing loss, tinnitus, sinus congestion, rhinorrhea, sore throat, neck stiffness, dysphagia PULM: Denies shortness of breath, cough, sputum production, hemoptysis, wheezing CV: Denies chest pain, edema, orthopnea, paroxysmal nocturnal dyspnea, palpitations GI: Denies abdominal pain, nausea, vomiting, diarrhea, hematochezia, melena, constipation, change in bowel habits GU: Denies dysuria, hematuria, polyuria,  oliguria, urethral discharge Endocrine: Denies hot or cold intolerance, polyuria, polyphagia or appetite change Derm: Denies rash, dry skin, scaling or peeling skin change Heme: Denies easy bruising, bleeding, bleeding gums Neuro: Denies headache, numbness, weakness, slurred speech, loss of memory or consciousness   Past Medical History:  He,  has no past medical history on file.   Surgical History:  History reviewed. No pertinent surgical history.   Social History:   reports that he has never smoked. He has never used smokeless tobacco. He reports current alcohol use of about 1.0 standard drink of alcohol per week.   Family History:  His family history is not on file.   Allergies No Known Allergies   Home Medications  Prior to Admission medications   Medication Sig Start Date End Date Taking? Authorizing Provider  acetaminophen (TYLENOL) 325 MG tablet Take 650 mg by mouth every 6 (six) hours as needed.   Yes [provider]  albuterol (VENTOLIN HFA) 108 (90 Base) MCG/ACT inhaler Inhale 2 puffs into the lungs every 4 (four) hours as needed. 10/31/20  Yes [provider]  fluticasone-salmeterol (ADVAIR) 100-50 MCG/ACT AEPB Inhale 1 puff into the lungs 2 (two) times daily. 11/03/20  Yes [provider]  Vitamin D, Ergocalciferol, (DRISDOL) 1.25 MG (50000 UNIT) CAPS capsule Take 1 capsule by mouth once a week. 10/31/20  Yes [provider]     Critical care provider statement:    Critical care time (minutes):  33   Critical care time was exclusive of:  Separately billable procedures and  treating other patients   Critical care was necessary to treat or prevent imminent or  life-threatening deterioration of the following conditions:  Severe ARDS pneumonia   Critical care was time spent personally by me on the following  activities:  Development of treatment plan with patient or surrogate,  discussions with consultants, evaluation of patient's  response to  treatment, examination of patient, obtaining history from patient or  surrogate, ordering and performing treatments and interventions, ordering  and review of laboratory studies and re-evaluation of patient's condition   I assumed direction of critical care for this patient from another  provider in my specialty: no        Ottie Glazier, M.D.  Pulmonary & [place]

## 2020-11-08 NOTE — Significant Event (Addendum)
Upon arrival to the ICU the patient was tachypneic with SpO2 btwn 89-90% on HHFNC at 85%/60L. Utilizing an interpreter the patient stated his breathing felt better, and overall he looked comfortable. Around 00:30 the patient became hypoxic dropping into the 60's, tachycardic in the 140's-150's & tachypneic in the 40's. NRB placed over HHFNC. Breath sounds on auscultation revealed coarse crackles.  Acute Hypoxic Respiratory Failure in the setting of CAP & suspected flash pulmonary edema - 40 mg of lasix ordered, IVF stopped - BIPAP STAT ordered - STAT CXR - discussed plan of care with patient via interpreter, patient denies chest pain > discussed that if this doesn't work he will need to be intubated and placed on the ventilator. Patient stated he understands.  Within 30 minutes on BIPAP patient's WOB and accessory muscle use increasing, with SpO2 dropping into the 70's. Decision made to intubate.  Acute Hypoxic / Hypercapnic Respiratory Failure in the setting of CAP & pulmonary edema Suspected PE Discussed the case with Dr. Karna Christmas, patient had negative BLE [place] earlier but there is still suspicion in the setting of Sepsis & sudden Acute respiratory failure. Will obtain CT angio chest  - Ventilator settings: PRVC  6 mL/kg, 100% FiO2, 10 PEEP, continue ventilator support & lung protective strategies - Wean PEEP & FiO2 as tolerated, maintain SpO2 > 90% - Head of bed elevated 30 degrees, VAP protocol in place - Plateau pressures less than 30 cm H20  - Intermittent chest x-ray & ABG PRN - Daily WUA with SBT as tolerated  - Ensure adequate pulmonary hygiene  - F/u cultures, trend PCT - Continue CAP Pna coverage cefepime, vancomycin, azithromycin - Steroids initiated: solu-medrol  40 mg BID  - Bronchodilators PRN - PAD protocol in place: continue Fentanyl drip & Propofol drip - CTa chest to r/o PE - re-swabbing for COVID-19  Called the patient's sister utilizing Interpreter telephone service.  The number listed did not ring- unable to leave a VM. Called also the home ph # in epic for the patient. Left a VM asking to please call for an update.   Cheryll Cockayne [place], AGACNP-BC Acute Care Nurse Practitioner [place] [facility]   [phone] / [phone] Please see Amion for pager details.

## 2020-11-08 NOTE — Progress Notes (Signed)
Found patient on VT different from previous set and reported. Updated NP.

## 2020-11-08 NOTE — Progress Notes (Signed)
*  PRELIMINARY RESULTS* Echocardiogram 2D Echocardiogram has been performed.  Lenor Coffin 11/08/2020, 2:23 PM

## 2020-11-08 NOTE — Progress Notes (Signed)
Patient O2 sats 89-90. Increased FiO2 to 90%. Britton-Lee, NP made aware. Will continue to monitor.

## 2020-11-08 NOTE — Progress Notes (Signed)
abg and sputum obtained. Decreased peep to 10 and o2 80% per NP.

## 2020-11-08 NOTE — Procedures (Signed)
Intubation Procedure Note  Jordan Gibson  [id number]  [birth year]  Date:11/08/20  Time:2:27 AM   Provider Performing:Adyan Palau L [place]    Procedure: Intubation (31500)  Indication(s) Respiratory Failure  Consent Risks of the procedure as well as the alternatives and risks of each were explained to the patient and/or caregiver.  Consent for the procedure was obtained and is signed in the bedside chart   Anesthesia Etomidate, Versed, Fentanyl and Rocuronium   Time Out Verified patient identification, verified procedure, site/side was marked, verified correct patient position, special equipment/implants available, medications/allergies/relevant history reviewed, required imaging and test results available.   Sterile Technique Usual hand hygeine, masks, and gloves were used   Procedure Description Patient positioned in bed supine.  Sedation given as noted above.  Patient was intubated with endotracheal tube using Glidescope.  View was Grade 3 only epiglottis .  Number of attempts was 2.  Colorimetric CO2 detector was consistent with tracheal placement.   Complications/Tolerance None; patient tolerated the procedure well. Chest X-ray is ordered to verify placement.   EBL Minimal   Specimen(s) None   Patient has a difficult airway, not midline and a Grade 3. Required a second attempt after repositioning with cricoid pressure.   Cheryll Cockayne [place], AGACNP-BC Acute Care Nurse Practitioner [place] [facility]   [phone] / [phone] Please see Amion for pager details.

## 2020-11-08 NOTE — Progress Notes (Signed)
Pharmacy Antibiotic Note  Carrol Bondar is a 40 y.o. male admitted on 11/01/2020 with pneumonia.  Pharmacy has been consulted for Vancomycin and Cefepime dosing.  -MRSA PCR negative, PCT 3.07>2.89>4.56   Plan: Day 2- Cefepime 2gm IV q8h     Height: 5\' 7"  (170.2 cm) Weight: 80.1 kg (176 lb 9.4 oz) IBW/kg (Calculated) : 66.1  Temp (24hrs), Avg:100.3 F (37.9 C), Min:96.98 F (36.1 C), Max:103.1 F (39.5 C)  Recent Labs  Lab 11/09/2020 2047 11/07/20 0653 11/08/20 0313  WBC 9.2 8.9 12.5*  CREATININE 1.14 1.03 0.99  LATICACIDVEN 1.9 1.4 1.7    Estimated Creatinine Clearance: 99.6 mL/min (by C-G formula based on SCr of 0.99 mg/dL).    No Known Allergies  Antimicrobials this admission: CTX x1 dose 5/26 (evening) in ED Azithromycin 5/26(evening) >> Vancomycin  5/27 >>  5/28 Cefepime  5/27 >>  Dose adjustments this admission:    Microbiology results: 5/26 BCx:  NG 2 d 5/26 UCx: pend  5/27 Sputum: needs recollection  5/27 MRSA PCR: negative  Thank you for allowing pharmacy to be a part of this patient's care.  Clennon Nasca A 11/08/2020 11:34 AM

## 2020-11-09 ENCOUNTER — Inpatient Hospital Stay: Payer: Medicaid Other

## 2020-11-09 ENCOUNTER — Encounter: Payer: Self-pay | Admitting: Internal Medicine

## 2020-11-09 LAB — CBC WITH DIFFERENTIAL/PLATELET
Abs Immature Granulocytes: 0.03 10*3/uL (ref 0.00–0.07)
Basophils Absolute: 0 10*3/uL (ref 0.0–0.1)
Basophils Relative: 0 %
Eosinophils Absolute: 0 10*3/uL (ref 0.0–0.5)
Eosinophils Relative: 0 %
HCT: 33.8 % — ABNORMAL LOW (ref 39.0–52.0)
Hemoglobin: 10.9 g/dL — ABNORMAL LOW (ref 13.0–17.0)
Immature Granulocytes: 1 %
Lymphocytes Relative: 11 %
Lymphs Abs: 0.6 10*3/uL — ABNORMAL LOW (ref 0.7–4.0)
MCH: 27.7 pg (ref 26.0–34.0)
MCHC: 32.2 g/dL (ref 30.0–36.0)
MCV: 85.8 fL (ref 80.0–100.0)
Monocytes Absolute: 0.2 10*3/uL (ref 0.1–1.0)
Monocytes Relative: 4 %
Neutro Abs: 5.1 10*3/uL (ref 1.7–7.7)
Neutrophils Relative %: 84 %
Platelets: 242 10*3/uL (ref 150–400)
RBC: 3.94 MIL/uL — ABNORMAL LOW (ref 4.22–5.81)
RDW: 13.6 % (ref 11.5–15.5)
WBC: 6 10*3/uL (ref 4.0–10.5)
nRBC: 0.3 % — ABNORMAL HIGH (ref 0.0–0.2)

## 2020-11-09 LAB — RESPIRATORY PANEL BY PCR

## 2020-11-09 LAB — HIV ANTIBODY (ROUTINE TESTING W REFLEX): HIV Screen 4th Generation wRfx: REACTIVE — AB

## 2020-11-09 LAB — GLUCOSE, CAPILLARY
Glucose-Capillary: 138 mg/dL — ABNORMAL HIGH (ref 70–99)
Glucose-Capillary: 148 mg/dL — ABNORMAL HIGH (ref 70–99)
Glucose-Capillary: 155 mg/dL — ABNORMAL HIGH (ref 70–99)
Glucose-Capillary: 168 mg/dL — ABNORMAL HIGH (ref 70–99)
Glucose-Capillary: 171 mg/dL — ABNORMAL HIGH (ref 70–99)
Glucose-Capillary: 197 mg/dL — ABNORMAL HIGH (ref 70–99)

## 2020-11-09 LAB — LEGIONELLA PNEUMOPHILA SEROGP 1 UR AG: L. pneumophila Serogp 1 Ur Ag: NEGATIVE

## 2020-11-09 LAB — COMPREHENSIVE METABOLIC PANEL
ALT: 43 U/L (ref 0–44)
AST: 33 U/L (ref 15–41)
Albumin: 2 g/dL — ABNORMAL LOW (ref 3.5–5.0)
Alkaline Phosphatase: 98 U/L (ref 38–126)
Anion gap: 6 (ref 5–15)
BUN: 24 mg/dL — ABNORMAL HIGH (ref 6–20)
CO2: 24 mmol/L (ref 22–32)
Calcium: 8.8 mg/dL — ABNORMAL LOW (ref 8.9–10.3)
Chloride: 107 mmol/L (ref 98–111)
Creatinine, Ser: 0.81 mg/dL (ref 0.61–1.24)
GFR, Estimated: >60 mL/min (ref >60)
Glucose, Bld: 170 mg/dL — ABNORMAL HIGH (ref 70–99)
Potassium: 4.7 mmol/L (ref 3.5–5.1)
Sodium: 137 mmol/L (ref 135–145)
Total Bilirubin: 0.7 mg/dL (ref 0.3–1.2)
Total Protein: 6.8 g/dL (ref 6.5–8.1)

## 2020-11-09 LAB — TRIGLYCERIDES: Triglycerides: 379 mg/dL — ABNORMAL HIGH (ref <150)

## 2020-11-09 LAB — PHOSPHORUS: Phosphorus: 4.9 mg/dL — ABNORMAL HIGH (ref 2.5–4.6)

## 2020-11-09 LAB — MAGNESIUM: Magnesium: 2.7 mg/dL — ABNORMAL HIGH (ref 1.7–2.4)

## 2020-11-09 MED ORDER — LORAZEPAM 2 MG/ML IJ SOLN
INTRAMUSCULAR | Status: AC
  Filled 2020-11-09: qty 1

## 2020-11-09 MED ORDER — CHLORHEXIDINE GLUCONATE CLOTH 2 % EX PADS
6.0000 | MEDICATED_PAD | Freq: Every day | CUTANEOUS | Status: DC
  Administered 2020-11-09 – 2020-11-16 (×8): 6 via TOPICAL

## 2020-11-09 MED ORDER — FUROSEMIDE 10 MG/ML IJ SOLN
40.0000 mg | Freq: Every day | INTRAMUSCULAR | Status: DC
  Administered 2020-11-09 – 2020-11-10 (×2): 40 mg via INTRAVENOUS
  Filled 2020-11-09: qty 4

## 2020-11-09 MED ORDER — CHLORHEXIDINE GLUCONATE 0.12 % MT SOLN
15.0000 mL | Freq: Two times a day (BID) | OROMUCOSAL | Status: DC
  Administered 2020-11-09 – 2020-11-14 (×10): 15 mL via OROMUCOSAL
  Filled 2020-11-09 (×9): qty 15

## 2020-11-09 MED ORDER — FUROSEMIDE 10 MG/ML IJ SOLN
INTRAMUSCULAR | Status: AC
  Filled 2020-11-09: qty 4

## 2020-11-09 MED ORDER — INSULIN ASPART 100 UNIT/ML IJ SOLN
0.0000 [IU] | INTRAMUSCULAR | Status: DC
  Administered 2020-11-09: 2 [IU] via SUBCUTANEOUS
  Administered 2020-11-09: 1 [IU] via SUBCUTANEOUS
  Administered 2020-11-09 – 2020-11-10 (×2): 2 [IU] via SUBCUTANEOUS
  Administered 2020-11-10: 3 [IU] via SUBCUTANEOUS
  Administered 2020-11-10: 1 [IU] via SUBCUTANEOUS
  Administered 2020-11-11: 2 [IU] via SUBCUTANEOUS
  Administered 2020-11-11: 3 [IU] via SUBCUTANEOUS
  Administered 2020-11-12 – 2020-11-13 (×5): 2 [IU] via SUBCUTANEOUS
  Administered 2020-11-13: 5 [IU] via SUBCUTANEOUS
  Administered 2020-11-14: 1 [IU] via SUBCUTANEOUS
  Administered 2020-11-14: 5 [IU] via SUBCUTANEOUS
  Administered 2020-11-14: 2 [IU] via SUBCUTANEOUS
  Filled 2020-11-09 (×18): qty 1

## 2020-11-09 MED ORDER — ORAL CARE MOUTH RINSE
15.0000 mL | Freq: Two times a day (BID) | OROMUCOSAL | Status: DC
  Administered 2020-11-09 – 2020-11-12 (×5): 15 mL via OROMUCOSAL

## 2020-11-09 MED ORDER — LORAZEPAM 2 MG/ML IJ SOLN
1.0000 mg | Freq: Once | INTRAMUSCULAR | Status: AC
  Administered 2020-11-09: 1 mg via INTRAVENOUS

## 2020-11-09 NOTE — Progress Notes (Signed)
Pt extubated this morning, tolerating heated HFNC. A&OX4. Denies pain. Tolerating clear liquids this evening. Lungs diminished. Coughing up tan secretions. Foley intact with adequate output. MD made aware of CBG levels trend upward and new orders received. Assessments completed with in person or video interpretor. Will continue to monitor.

## 2020-11-09 NOTE — Plan of Care (Signed)

## 2020-11-09 NOTE — Progress Notes (Signed)
NAME:  Jordan Gibson, MRN:  [id number], DOB:  1980-10-14, LOS: 3 ADMISSION DATE:  10/26/2020, CONSULTATION DATE:  11/07/2020 REFERRING MD:  Dr. Roderic Palau, CHIEF COMPLAINT:  Shortness of breath, cough   Brief Pt Description/Synopsis:  40 y.o. Male admitted with Acute Hypoxic Respiratory Failure and Sepsis in the setting of Community Acquired Pneumonia.   History of Present Illness:  Jordan Gibson is a 40 y.o. Male with no significant past medical history who presented to [facility] ED on 11/07/20 with a 1 week history of shortness of breath, nonproductive cough, and fever. He denied chest pain, abdominal pain, Nausea, vomiting, dysuria, lower extremity pain or swelling.  Denies recent travel or sick contacts.  ED Course:  Upon arrival to ED vitals included: fever with Temperature 102, pulse 132, RR 24, O2 sats 52% on room air.  He was placed on HFNC with improvement of O2 sats to the mid 90's.  Workup significant for WBC 9.2, procalcitonin 3.07, lactic acid 1.9, BNP 44, HS Troponin 65, sodium 126.  Urinalysis is unremarkable.  COVID-19 and Influenza PCR's are both negative.  Chest x-ray with symmetric bilateral pulmonary airspace disease concerning for atypical infection vs. Edema vs hemorrhage.  He met sepsis criteria and was started on IV Rocephin and  Azithromycin, along with fluid bolus.  He is to be admitted to [facility] unit by Hospitalist.  While in ED, he continues to require high amounts of supplemental O2 and continues to exhibit moderate respiratory distress.  He is at high risk for respiratory deterioration and need for intubation.  PCCM is consulted for further assistance and management of Acute Hypoxic Respiratory Failure and Sepsis in the setting of Community Acquired Pneumonia.     Cultures:  11/05/2020: SARS-CoV-2 and influenza PCR>> negative 11/05/2020: Blood culture x2>> 10/22/2020: Urine culture>> 11/07/2020: Sputum>> 11/07/2020: Strep pneumo urinary antigen>> 11/07/2020:  Legionella urinary antigen>> 11/07/2020: Mycoplasma pneumonia>> 11/07/2020: Respiratory viral panel>> 11/07/2020: MRSA PCR>>  Antimicrobials:  Azithromycin 5/26>> Ceftriaxone 5/26>> 5/27 Cefepime 5/27>> Vancomycin 5/27>>  Significant Hospital Events: Including procedures, antibiotic start and stop dates in addition to other pertinent events   . 10/16/2020: Presented to ED, to be admitted to stepdown unit by hospitalist . 11/07/2020: Increasing FiO2 requirements and worsening respiratory status, PCCM consulted due to high risk for deterioration and need for intubation. ABX broadened to Cefepime and Vancomycin, along with Azithromycin . 11/08/20- patient with respiratory failure s/p ETT overnight. I met with family at bedside today and reviewed care plan.  Vancomycin dcd today MRSA pcr negative.  . -11/09/20- working on weaning ventilator and liberating from MV post SBT.  Patient with severe ARDS. I met with family and reviewed care plan. +Rhino/ENtero virus    Objective   Blood pressure 103/65, pulse 80, temperature 99.14 F (37.3 C), temperature source Rectal, resp. rate (!) 24, height 5' 7" (1.702 m), weight 80.1 kg, SpO2 94 %.    Vent Mode: Spontaneous FiO2 (%):  [24 %-40 %] 24 % Set Rate:  [8 bmp-24 bmp] 8 bmp Vt Set:  [480 mL-500 mL] 480 mL PEEP:  [5 cmH20-8 cmH20] 5 cmH20 Pressure Support:  [5 cmH20] 5 cmH20 Plateau Pressure:  [26 cmH20] 26 cmH20   Intake/Output Summary (Last 24 hours) at 11/09/2020 0855 Last data filed at 11/09/2020 0734 Gross per 24 hour  Intake 1805.56 ml  Output 2015 ml  Net -209.44 ml   Filed Weights   11/03/2020 2040 11/07/20 2000  Weight: 77.1 kg 80.1 kg    Examination:  General:  Intubated on MV HENT: Atraumatic, normocephalic, neck supple, no JVD Lungs: rhonch bilateraly Cardiovascular: Tachycardia, regular rhythm, S1-S2, no murmurs, rubs, gallops, 2+ radial and DP pulses Abdomen: Soft, nontender, nondistended, no guarding rebound tenderness,  bowel sounds positive x4 Extremities: Normal bulk and tone, no deformities, no edema Neuro: GS4T on ventilator GU: Deferred Skin: Warm and diaphoretic.  No obvious rashes, lesions, ulcerations    Resolved Hospital Problem list   N/A   Assessment & Plan:   Acute Hypoxic Respiratory Failure in the setting of SEVERE ARDS due to Community Acquired Pneumonia -on MV -Tracheal aspriate -MRSA negative - dcd vancomycin -weaned FiO2 from 100% to 50% RR18 peep 10 -tracheal aspirate -RVP pending -additional microbiology in process -fungal and bacterial    Sepsis in the setting of Community Acquired Pneumonia -Monitor fever curve -Trend WBC's & Procalcitonin -Follow cultures as above -Check Strep pneumo urinary antigen, legionella, mycoplasma pneumoniae, Respiratory Viral Panel, MRSA PCR, sputum culture -Continue Azithromycin, will broaden from Rocephin to Cefepime & Vancomycin for now (given tenuous respiratory status) pending cultures and sensitivities  Hyponatremia, suspect Hypotonic Hypovolemic -Monitor I&O's / urinary output -Follow BMP -Ensure adequate renal perfusion -Avoid nephrotoxic agents as able -Replace electrolytes as indicated -IV fluids -Serum osmolarity pending   Pt's respiratory status is very tenuous.  High risk for deterioration, need for intubation, cardiac arrest and death.  Prognosis is guarded.  Best practice (right click and "Reselect all SmartList Selections" daily)  Diet:  Oral Pain/Anxiety/Delirium protocol (if indicated): No VAP protocol (if indicated): Not indicated DVT prophylaxis: LMWH GI prophylaxis: N/A Glucose control:  SSI No Central venous access:  N/A Arterial line:  N/A Foley:  N/A Mobility:  bed rest  PT consulted: N/A Last date of multidisciplinary goals of care discussion [N/A] Code Status:  full code Disposition: Stepdown  Labs   CBC: Recent Labs  Lab 11/04/2020 2047 11/07/20 0653 11/08/20 0313 11/09/20 0416  WBC 9.2 8.9  12.5* 6.0  NEUTROABS 7.2  --   --  5.1  HGB 13.0 12.0* 12.5* 10.9*  HCT 38.8* 35.1* 38.8* 33.8*  MCV 82.9 82.8 86.0 85.8  PLT 324 268 303 625    Basic Metabolic Panel: Recent Labs  Lab 10/16/2020 2047 11/07/20 0653 11/08/20 0313 11/09/20 0416  NA 126* 129* 130* 137  K 3.5 4.1 3.7 4.7  CL 94* 97* 97* 107  CO2 19* _0 GLUCOSE 127* 92 109* 170*  BUN _1 24*  CREATININE 1.14 1.03 0.99 0.81  CALCIUM 8.3* 8.0* 8.0* 8.8*  MG  --   --  2.1 2.7*  PHOS  --   --  5.9* 4.9*   GFR: Estimated Creatinine Clearance: 121.7 mL/min (by C-G formula based on SCr of 0.81 mg/dL). Recent Labs  Lab 10/20/2020 2047 10/12/2020 2357 11/07/20 0653 11/08/20 0313 11/09/20 0416  PROCALCITON  --  3.07 2.89 4.56  --   WBC 9.2  --  8.9 12.5* 6.0  LATICACIDVEN 1.9  --  1.4 1.7  --     Liver Function Tests: Recent Labs  Lab 11/05/2020 2047 11/09/20 0416  AST 67* 33  ALT 54* 43  ALKPHOS 129* 98  BILITOT 0.7 0.7  PROT 8.4* 6.8  ALBUMIN 2.7* 2.0*   No results for input(s): LIPASE, AMYLASE in the last 168 hours. No results for input(s): AMMONIA in the last 168 hours.  ABG    Component Value Date/Time   PHART 7.30 (L) 11/08/2020 0500   PCO2ART 48 11/08/2020 0500  PO2ART 239 (H) 11/08/2020 0500   HCO3 23.6 11/08/2020 0500   ACIDBASEDEF 3.2 (H) 11/08/2020 0500   O2SAT 99.8 11/08/2020 0500     Coagulation Profile: Recent Labs  Lab 11/01/2020 2047 11/07/20 0653 11/08/20 0313  INR 1.1 1.2 1.3*    Cardiac Enzymes: No results for input(s): CKTOTAL, CKMB, CKMBINDEX, TROPONINI in the last 168 hours.  HbA1C: No results found for: HGBA1C  CBG: Recent Labs  Lab 11/08/20 1528 11/08/20 1920 11/08/20 2253 11/09/20 0347 11/09/20 0743  GLUCAP 161* 154* 148* 155* 171*    Review of Systems:   Unable to obtain due to sedation on MV    Past Medical History:  He,  has no past medical history on file.   Surgical History:  History reviewed. No pertinent surgical history.    Social History:   reports that he has never smoked. He has never used smokeless tobacco. He reports current alcohol use of about 1.0 standard drink of alcohol per week.   Family History:  His family history is not on file.   Allergies No Known Allergies   Home Medications  Prior to Admission medications   Medication Sig Start Date End Date Taking? Authorizing Provider  acetaminophen (TYLENOL) 325 MG tablet Take 650 mg by mouth every 6 (six) hours as needed.   Yes [provider]  albuterol (VENTOLIN HFA) 108 (90 Base) MCG/ACT inhaler Inhale 2 puffs into the lungs every 4 (four) hours as needed. 10/31/20  Yes [provider]  fluticasone-salmeterol (ADVAIR) 100-50 MCG/ACT AEPB Inhale 1 puff into the lungs 2 (two) times daily. 11/03/20  Yes [provider]  Vitamin D, Ergocalciferol, (DRISDOL) 1.25 MG (50000 UNIT) CAPS capsule Take 1 capsule by mouth once a week. 10/31/20  Yes [provider]     Critical care provider statement:    Critical care time (minutes):  33   Critical care time was exclusive of:  Separately billable procedures and  treating other patients   Critical care was necessary to treat or prevent imminent or  life-threatening deterioration of the following conditions:  Severe ARDS pneumonia   Critical care was time spent personally by me on the following  activities:  Development of treatment plan with patient or surrogate,  discussions with consultants, evaluation of patient's response to  treatment, examination of patient, obtaining history from patient or  surrogate, ordering and performing treatments and interventions, ordering  and review of laboratory studies and re-evaluation of patient's condition   I assumed direction of critical care for this patient from another  provider in my specialty: no        Ottie Glazier, M.D.  Pulmonary & [place]

## 2020-11-09 NOTE — Progress Notes (Signed)
Patient temp normothermic on first assessment. Bair hugger placed on standby. MAP greater than 70, Levophed placed on hold. Will continue to monitor.

## 2020-11-09 NOTE — Progress Notes (Signed)
Pt extubated to HFNC at 40L and 45%, pt tolerating well, able to talk in complete sentences. Will continue to monitor.

## 2020-11-10 LAB — RENAL FUNCTION PANEL
Albumin: 2 g/dL — ABNORMAL LOW (ref 3.5–5.0)
Anion gap: 10 (ref 5–15)
BUN: 25 mg/dL — ABNORMAL HIGH (ref 6–20)
CO2: 24 mmol/L (ref 22–32)
Calcium: 8.5 mg/dL — ABNORMAL LOW (ref 8.9–10.3)
Chloride: 101 mmol/L (ref 98–111)
Creatinine, Ser: 0.76 mg/dL (ref 0.61–1.24)
GFR, Estimated: >60 mL/min (ref >60)
Glucose, Bld: 102 mg/dL — ABNORMAL HIGH (ref 70–99)
Phosphorus: 4 mg/dL (ref 2.5–4.6)
Potassium: 3.8 mmol/L (ref 3.5–5.1)
Sodium: 135 mmol/L (ref 135–145)

## 2020-11-10 LAB — CBC
HCT: 35.2 % — ABNORMAL LOW (ref 39.0–52.0)
Hemoglobin: 11.6 g/dL — ABNORMAL LOW (ref 13.0–17.0)
MCH: 27.6 pg (ref 26.0–34.0)
MCHC: 33 g/dL (ref 30.0–36.0)
MCV: 83.8 fL (ref 80.0–100.0)
Platelets: 290 10*3/uL (ref 150–400)
RBC: 4.2 MIL/uL — ABNORMAL LOW (ref 4.22–5.81)
RDW: 13.3 % (ref 11.5–15.5)
WBC: 8.6 10*3/uL (ref 4.0–10.5)
nRBC: 0 % (ref 0.0–0.2)

## 2020-11-10 LAB — CULTURE, RESPIRATORY W GRAM STAIN: Culture: NORMAL

## 2020-11-10 LAB — GLUCOSE, CAPILLARY
Glucose-Capillary: 100 mg/dL — ABNORMAL HIGH (ref 70–99)
Glucose-Capillary: 109 mg/dL — ABNORMAL HIGH (ref 70–99)
Glucose-Capillary: 218 mg/dL — ABNORMAL HIGH (ref 70–99)
Glucose-Capillary: 80 mg/dL (ref 70–99)
Glucose-Capillary: 155 mg/dL — ABNORMAL HIGH (ref 70–99)
Glucose-Capillary: 108 mg/dL — ABNORMAL HIGH (ref 70–99)

## 2020-11-10 LAB — MAGNESIUM: Magnesium: 2.5 mg/dL — ABNORMAL HIGH (ref 1.7–2.4)

## 2020-11-10 MED ORDER — FUROSEMIDE 10 MG/ML IJ SOLN: 20.0000 mg | Freq: Every day | INTRAMUSCULAR | Status: DC

## 2020-11-10 MED ORDER — MAGNESIUM SULFATE 2 GM/50ML IV SOLN: 2.0000 g | Freq: Once | INTRAVENOUS | Status: DC

## 2020-11-10 MED ORDER — GUAIFENESIN-DM 100-10 MG/5ML PO SYRP
5.0000 mL | ORAL_SOLUTION | ORAL | Status: DC | PRN
  Administered 2020-11-10 – 2020-11-14 (×10): 5 mL via ORAL
  Filled 2020-11-10 (×10): qty 5

## 2020-11-10 NOTE — Progress Notes (Signed)
NAME:  Jordan Gibson, MRN:  [id number], DOB:  1981/05/25, LOS: 4 ADMISSION DATE:  10/12/2020, CONSULTATION DATE:  11/07/2020 REFERRING MD:  Dr. Roderic Palau, CHIEF COMPLAINT:  Shortness of breath, cough   Brief Pt Description/Synopsis:  40 y.o. Male admitted with Acute Hypoxic Respiratory Failure and Sepsis in the setting of Community Acquired Pneumonia.   History of Present Illness:  Jordan Gibson is a 40 y.o. Male with no significant past medical history who presented to [facility] ED on 11/07/20 with a 1 week history of shortness of breath, nonproductive cough, and fever. He denied chest pain, abdominal pain, Nausea, vomiting, dysuria, lower extremity pain or swelling.  Denies recent travel or sick contacts.  ED Course:  Upon arrival to ED vitals included: fever with Temperature 102, pulse 132, RR 24, O2 sats 52% on room air.  He was placed on HFNC with improvement of O2 sats to the mid 90's.  Workup significant for WBC 9.2, procalcitonin 3.07, lactic acid 1.9, BNP 44, HS Troponin 65, sodium 126.  Urinalysis is unremarkable.  COVID-19 and Influenza PCR's are both negative.  Chest x-ray with symmetric bilateral pulmonary airspace disease concerning for atypical infection vs. Edema vs hemorrhage.  He met sepsis criteria and was started on IV Rocephin and  Azithromycin, along with fluid bolus.  He is to be admitted to [facility] unit by Hospitalist.  While in ED, he continues to require high amounts of supplemental O2 and continues to exhibit moderate respiratory distress.  He is at high risk for respiratory deterioration and need for intubation.  PCCM is consulted for further assistance and management of Acute Hypoxic Respiratory Failure and Sepsis in the setting of Community Acquired Pneumonia.    Significant Hospital Events: Including procedures, antibiotic start and stop dates in addition to other pertinent events   . 11/01/2020: Presented to ED, to be admitted to stepdown unit by  hospitalist . 11/07/2020: Increasing FiO2 requirements and worsening respiratory status, PCCM consulted due to high risk for deterioration and need for intubation. ABX broadened to Cefepime and Vancomycin, along with Azithromycin . 11/08/20- patient with respiratory failure s/p ETT overnight. I met with family at bedside today and reviewed care plan.  Vancomycin dcd today MRSA pcr negative.  . -11/09/20- working on weaning ventilator and liberating from MV post SBT.  Patient with severe ARDS. I met with family and reviewed care plan. +Rhino/ENtero virus  11/10/20- patient improved, has been extubated to HFNC.  Tranferring to step down unit, signed out to [facility].  Cultures:  11/05/2020: SARS-CoV-2 and influenza PCR>> negative 11/09/2020: Blood culture x2>> 10/15/2020: Urine culture>> 11/07/2020: Sputum>> 11/07/2020: Strep pneumo urinary antigen>> 11/07/2020: Legionella urinary antigen>> 11/07/2020: Mycoplasma pneumonia>> 11/07/2020: Respiratory viral panel>> 11/07/2020: MRSA PCR>>  Antimicrobials:  Azithromycin 5/26>> Ceftriaxone 5/26>> 5/27 Cefepime 5/27>> Vancomycin 5/27>>     Objective   Blood pressure 135/90, pulse 95, temperature 98.4 F (36.9 C), temperature source Axillary, resp. rate (!) 25, height 5' 7" (1.702 m), weight 80.1 kg, SpO2 94 %.    FiO2 (%):  [55 %-60 %] 55 %   Intake/Output Summary (Last 24 hours) at 11/10/2020 0949 Last data filed at 11/10/2020 0500 Gross per 24 hour  Intake 648.17 ml  Output 3075 ml  Net -2426.83 ml   Filed Weights   11/02/2020 2040 11/07/20 2000  Weight: 77.1 kg 80.1 kg    Examination:  General:  Age appropriate on HFNC in no distress HENT: Atraumatic, normocephalic, neck supple, no JVD Lungs: rhonch bilateraly Cardiovascular: Tachycardia, regular  rhythm, S1-S2, no murmurs, rubs, gallops, 2+ radial and DP pulses Abdomen: Soft, nontender, nondistended, no guarding rebound tenderness, bowel sounds positive x4 Extremities: Normal bulk and tone,  no deformities, no edema Neuro: awake alert lucid no FND grossly GU: Deferred Skin: Warm and diaphoretic.  No obvious rashes, lesions, ulcerations    Resolved Hospital Problem list   N/A   Assessment & Plan:   Acute Hypoxic Respiratory Failure in the setting of SEVERE ARDS due to Community Acquired Pneumonia -on HFNC -Tracheal aspriate-+Viral infection -MRSA negative - dcd vancomycin -extubated 11/09/20 -tracheal aspirate -RVP + entero/rhino virus -additional microbiology in process -fungal and bacterial    Severe ARDS due to viral pneumonia- improved s/p extubation from ventilator  Respiratory Viral Panel -+entero/rvp -dc cefepime /vanoc -c/w zithromax  -dc solumedrol -pt/ot -incentive spirometry -continue diuresis - reduced lasix to 20 iv daily   Alcoholism    - patient admits to chronic alcoholism   -patient admits to symptoms of withdrawal   - CIWA protocol acivated   - patient on PRN benzodiazepines     Moderate protein calorie malnutrition  bitemporal wasting  - low albumin   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  High risk for deterioration, need for intubation, cardiac arrest and death.  Prognosis is guarded.  Best practice (right click and "Reselect all SmartList Selections" daily)  Diet:  Oral Pain/Anxiety/Delirium protocol (if indicated): No VAP protocol (if indicated): Not indicated DVT prophylaxis: LMWH GI prophylaxis: N/A Glucose control:  SSI No Central venous access:  N/A Arterial line:  N/A Foley:  N/A Mobility:  bed rest  PT consulted: N/A Last date of multidisciplinary goals of care discussion [N/A] Code Status:  full code Disposition: Stepdown  Labs   CBC: Recent Labs  Lab 11/02/2020 2047 11/07/20 0653 11/08/20 0313 11/09/20 0416 11/10/20 0353  WBC 9.2 8.9 12.5* 6.0 8.6  NEUTROABS 7.2  --   --  5.1  --   HGB 13.0  12.0* 12.5* 10.9* 11.6*  HCT 38.8* 35.1* 38.8* 33.8* 35.2*  MCV 82.9 82.8 86.0 85.8 83.8  PLT 324 268 303 242 010    Basic Metabolic Panel: Recent Labs  Lab 11/10/2020 2047 11/07/20 0653 11/08/20 0313 11/09/20 0416 11/10/20 0353  NA 126* 129* 130* 137 135  K 3.5 4.1 3.7 4.7 3.8  CL 94* 97* 97* 107 101  CO2 19* _0 GLUCOSE 127* 92 109* 170* 102*  BUN _1 24* 25*  CREATININE 1.14 1.03 0.99 0.81 0.76  CALCIUM 8.3* 8.0* 8.0* 8.8* 8.5*  MG  --   --  2.1 2.7* 2.5*  PHOS  --   --  5.9* 4.9* 4.0   GFR: Estimated Creatinine Clearance: 123.2 mL/min (by C-G formula based on SCr of 0.76 mg/dL). Recent Labs  Lab 11/11/2020 2047 11/11/2020 2357 11/07/20 0653 11/08/20 0313 11/09/20 0416 11/10/20 0353  PROCALCITON  --  3.07 2.89 4.56  --   --   WBC 9.2  --  8.9 12.5* 6.0 8.6  LATICACIDVEN 1.9  --  1.4 1.7  --   --     Liver Function Tests: Recent Labs  Lab 11/03/2020 2047 11/09/20 0416 11/10/20 0353  AST 67* 33  --   ALT 54* 43  --   ALKPHOS 129* 98  --   BILITOT 0.7 0.7  --   PROT 8.4* 6.8  --   ALBUMIN  2.7* 2.0* 2.0*   No results for input(s): LIPASE, AMYLASE in the last 168 hours. No results for input(s): AMMONIA in the last 168 hours.  ABG    Component Value Date/Time   PHART 7.30 (L) 11/08/2020 0500   PCO2ART 48 11/08/2020 0500   PO2ART 239 (H) 11/08/2020 0500   HCO3 23.6 11/08/2020 0500   ACIDBASEDEF 3.2 (H) 11/08/2020 0500   O2SAT 99.8 11/08/2020 0500     Coagulation Profile: Recent Labs  Lab 10/21/2020 2047 11/07/20 0653 11/08/20 0313  INR 1.1 1.2 1.3*    Cardiac Enzymes: No results for input(s): CKTOTAL, CKMB, CKMBINDEX, TROPONINI in the last 168 hours.  HbA1C: No results found for: HGBA1C  CBG: Recent Labs  Lab 11/09/20 1543 11/09/20 1955 11/09/20 2352 11/10/20 0351 11/10/20 0738  GLUCAP 148* 168* 138* 100* 109*    Review of Systems:   Unable to obtain due to sedation on MV    Past Medical History:  He,  has no past  medical history on file.   Surgical History:  History reviewed. No pertinent surgical history.   Social History:   reports that he has never smoked. He has never used smokeless tobacco. He reports current alcohol use of about 1.0 standard drink of alcohol per week.   Family History:  His family history is not on file.   Allergies No Known Allergies   Home Medications  Prior to Admission medications   Medication Sig Start Date End Date Taking? Authorizing Provider  acetaminophen (TYLENOL) 325 MG tablet Take 650 mg by mouth every 6 (six) hours as needed.   Yes [provider]  albuterol (VENTOLIN HFA) 108 (90 Base) MCG/ACT inhaler Inhale 2 puffs into the lungs every 4 (four) hours as needed. 10/31/20  Yes [provider]  fluticasone-salmeterol (ADVAIR) 100-50 MCG/ACT AEPB Inhale 1 puff into the lungs 2 (two) times daily. 11/03/20  Yes [provider]  Vitamin D, Ergocalciferol, (DRISDOL) 1.25 MG (50000 UNIT) CAPS capsule Take 1 capsule by mouth once a week. 10/31/20  Yes [provider]     Critical care provider statement:    Critical care time (minutes):  33   Critical care time was exclusive of:  Separately billable procedures and  treating other patients   Critical care was necessary to treat or prevent imminent or  life-threatening deterioration of the following conditions:  Severe ARDS pneumonia   Critical care was time spent personally by me on the following  activities:  Development of treatment plan with patient or surrogate,  discussions with consultants, evaluation of patient's response to  treatment, examination of patient, obtaining history from patient or  surrogate, ordering and performing treatments and interventions, ordering  and review of laboratory studies and re-evaluation of patient's condition   I assumed direction of critical care for this patient from another  provider in my specialty: no        Ottie Glazier, M.D.   Pulmonary & [place]

## 2020-11-10 NOTE — Progress Notes (Signed)
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

## 2020-11-11 DIAGNOSIS — B59 Pneumocystosis: Secondary | ICD-10-CM

## 2020-11-11 DIAGNOSIS — J9601 Acute respiratory failure with hypoxia: Secondary | ICD-10-CM

## 2020-11-11 DIAGNOSIS — B20 Human immunodeficiency virus [HIV] disease: Principal | ICD-10-CM

## 2020-11-11 DIAGNOSIS — J189 Pneumonia, unspecified organism: Secondary | ICD-10-CM

## 2020-11-11 LAB — HIV-1/2 AB - DIFFERENTIATION
HIV 1 Ab: REACTIVE
HIV 2 Ab: NONREACTIVE

## 2020-11-11 LAB — HEPATITIS C ANTIBODY: HCV Ab: NONREACTIVE

## 2020-11-11 LAB — BASIC METABOLIC PANEL
Anion gap: 9 (ref 5–15)
BUN: 22 mg/dL — ABNORMAL HIGH (ref 6–20)
CO2: 26 mmol/L (ref 22–32)
Calcium: 8.6 mg/dL — ABNORMAL LOW (ref 8.9–10.3)
Chloride: 98 mmol/L (ref 98–111)
Creatinine, Ser: 0.73 mg/dL (ref 0.61–1.24)
GFR, Estimated: >60 mL/min (ref >60)
Glucose, Bld: 88 mg/dL (ref 70–99)
Potassium: 3.7 mmol/L (ref 3.5–5.1)
Sodium: 133 mmol/L — ABNORMAL LOW (ref 135–145)

## 2020-11-11 LAB — MAGNESIUM: Magnesium: 2.2 mg/dL (ref 1.7–2.4)

## 2020-11-11 LAB — CBC
HCT: 37.6 % — ABNORMAL LOW (ref 39.0–52.0)
Hemoglobin: 12.7 g/dL — ABNORMAL LOW (ref 13.0–17.0)
MCH: 28 pg (ref 26.0–34.0)
MCHC: 33.8 g/dL (ref 30.0–36.0)
MCV: 82.8 fL (ref 80.0–100.0)
Platelets: 307 10*3/uL (ref 150–400)
RBC: 4.54 MIL/uL (ref 4.22–5.81)
RDW: 13.2 % (ref 11.5–15.5)
WBC: 6.9 10*3/uL (ref 4.0–10.5)
nRBC: 0 % (ref 0.0–0.2)

## 2020-11-11 LAB — GLUCOSE, CAPILLARY
Glucose-Capillary: 92 mg/dL (ref 70–99)
Glucose-Capillary: 85 mg/dL (ref 70–99)
Glucose-Capillary: 157 mg/dL — ABNORMAL HIGH (ref 70–99)
Glucose-Capillary: 102 mg/dL — ABNORMAL HIGH (ref 70–99)
Glucose-Capillary: 177 mg/dL — ABNORMAL HIGH (ref 70–99)
Glucose-Capillary: 228 mg/dL — ABNORMAL HIGH (ref 70–99)

## 2020-11-11 LAB — CULTURE, BLOOD (ROUTINE X 2)
Culture: NO GROWTH
Culture: NO GROWTH
Special Requests: ADEQUATE
Special Requests: ADEQUATE

## 2020-11-11 LAB — MYCOPLASMA PNEUMONIAE ANTIBODY, IGM: Mycoplasma pneumo IgM: <770 U/mL (ref 0–769)

## 2020-11-11 LAB — LACTATE DEHYDROGENASE: LDH: 551 U/L — ABNORMAL HIGH (ref 98–192)

## 2020-11-11 LAB — HEPATITIS B SURFACE ANTIGEN: Hepatitis B Surface Ag: NONREACTIVE

## 2020-11-11 LAB — PHOSPHORUS: Phosphorus: 3.7 mg/dL (ref 2.5–4.6)

## 2020-11-11 MED ORDER — SULFAMETHOXAZOLE-TRIMETHOPRIM 400-80 MG/5ML IV SOLN
375.0000 mg | Freq: Three times a day (TID) | INTRAVENOUS | Status: DC
  Administered 2020-11-11 – 2020-11-16 (×14): 375 mg via INTRAVENOUS
  Filled 2020-11-11 (×6): qty 23.44
  Filled 2020-11-11: qty 10
  Filled 2020-11-11: qty 20
  Filled 2020-11-11 (×8): qty 23.44

## 2020-11-11 MED ORDER — BENZONATATE 100 MG PO CAPS
200.0000 mg | ORAL_CAPSULE | Freq: Three times a day (TID) | ORAL | Status: DC | PRN
  Administered 2020-11-11 – 2020-11-14 (×4): 200 mg via ORAL
  Filled 2020-11-11 (×4): qty 2

## 2020-11-11 MED ORDER — PREDNISONE 20 MG PO TABS
40.0000 mg | ORAL_TABLET | Freq: Two times a day (BID) | ORAL | Status: DC
  Administered 2020-11-11 – 2020-11-12 (×2): 40 mg via ORAL
  Filled 2020-11-11 (×2): qty 2

## 2020-11-11 NOTE — Plan of Care (Signed)

## 2020-11-11 NOTE — Evaluation (Addendum)
Physical Therapy Evaluation Patient Details Name: Jordan Gibson MRN: [id number] DOB: 1980-11-26 Today's Date: 11/11/2020   History of Present Illness  Jordan Gibson is a 40yo Spanish speaking M who comes to [facility] on 10/16/2020  c SOB and fevers x 6 days. Pt has no PMH. Pt found to have (-) COVID19, (-) flu, admitted with CAP (atypical), hyponatremia, transaminitis. Pt with (+) routine HIV test as well. Pt requiring HHHF inititally, then quickly transitioning to ET intubation. PTA pt lives in [place], lives alone, works in Diet[employer], came to area to be near his Sister when he became ill.  Clinical Impression  Pt admitted with above diagnosis. Pt currently with functional limitations due to the deficits listed below (see "PT Problem List"). Upon entry, pt in bed, awake, and agreeable to participate. Pt appears exhausted, reports to feel weak. Sister is in room, recently just arrived, facilitates communication with patient who is limited in interaction due to malaise and fatigue. Pt in on 50L 70% FiO2 HHHF throughout session, SpO2 >90% throughout, HR ~120bpm throughout. Pt has dizziness briefly after coming to sitting, again after standing, BP WNL.Pt able to come to sitttng with great effort, no assist required, tolerates sitting EOB for >15 minutes in session despite feeling severely weak. Pt able to rise to standing with minA physical assist and continued minA for standing balance. Sister reports pt could stay with her at DC in her ground level house, no stairs to enter, where she lives with husband and 2 young children (<10yo). Patient's performance this date reveals decreased ability, independence, and tolerance in performing all basic mobility required for performance of activities of daily living. Pt requires additional DME, close physical assistance, and cues for safe participate in mobility. Pt will benefit from skilled PT intervention to increase independence and safety with basic  mobility in preparation for discharge to the venue listed below.       Follow Up Recommendations Home health PT;Supervision - Intermittent    Equipment Recommendations  Rolling walker with 5" wheels    Recommendations for Other Services       Precautions / Restrictions Precautions Precautions: None Restrictions Weight Bearing Restrictions: No      Mobility  Bed Mobility Overal bed mobility: Needs Assistance Bed Mobility: Supine to Sit     Supine to sit: Modified independent (Device/Increase time);HOB elevated     General bed mobility comments: heavy effort, slight dizziness, improves over 1-2 minutes    Transfers Overall transfer level: Needs assistance   Transfers: Sit to/from Stand Sit to Stand: Min assist         General transfer comment: minA of trunk provided due to seevre weakness, able to remain standing for ~21minutes, some unsteadiness.  Ambulation/Gait Ambulation/Gait assistance:  (deferred; pt too weak, not interested in trying.)              Stairs            Wheelchair Mobility    Modified Rankin (Stroke Patients Only)       Balance Overall balance assessment: Mild deficits observed, not formally tested;Needs assistance                                           Pertinent Vitals/Pain Pain Assessment: No/denies pain    Home Living Family/patient expects to be discharged to:: Private residence Living Arrangements: Alone Available Help at Discharge:  Family (Lives in [place], but here d/t illness to be near family.) Type of Home: House Home Access: Stairs to enter   Secretary/administrator of Steps: 3 Home Layout: One level Home Equipment: None      Prior Function Level of Independence: Independent         Comments: works FT in [employer].     Hand Dominance        Extremity/Trunk Assessment   Upper Extremity Assessment Upper Extremity Assessment: Generalized weakness;Overall [facility] for tasks  assessed    Lower Extremity Assessment Lower Extremity Assessment: Generalized weakness;Overall [facility] for tasks assessed    Cervical / Trunk Assessment Cervical / Trunk Assessment: Normal  Communication      Cognition Arousal/Alertness: Lethargic Behavior During Therapy: WFL for tasks assessed/performed Overall Cognitive Status: Within Functional Limits for tasks assessed                                        General Comments      Exercises     Assessment/Plan    PT Assessment Patient needs continued PT services  PT Problem List Decreased strength;Decreased range of motion;Decreased activity tolerance;Decreased balance;Decreased knowledge of use of DME;Decreased safety awareness;Decreased knowledge of precautions       PT Treatment Interventions DME instruction;Therapeutic exercise;Functional mobility training;Balance training;Gait training;Stair training;Patient/family education    PT Goals (Current goals can be found in the Care Plan section)  Acute Rehab PT Goals Patient Stated Goal: Regain strength and activity tolerance PT Goal Formulation: With patient Time For Goal Achievement: 11/25/20 Potential to Achieve Goals: Good    Frequency Min 2X/week   Barriers to discharge        Co-evaluation               AM-PAC PT "6 Clicks" Mobility  Outcome Measure Help needed turning from your back to your side while in a flat bed without using bedrails?: None Help needed moving from lying on your back to sitting on the side of a flat bed without using bedrails?: None Help needed moving to and from a bed to a chair (including a wheelchair)?: A Lot Help needed standing up from a chair using your arms (e.g., wheelchair or bedside chair)?: A Lot Help needed to walk in hospital room?: A Lot Help needed climbing 3-5 steps with a railing? : A Lot 6 Click Score: 16    End of Session Equipment Utilized During Treatment: Oxygen Activity Tolerance: Patient  tolerated treatment well;Patient limited by fatigue Patient left: in bed;with family/visitor present;with call bell/phone within reach Nurse Communication: Mobility status PT Visit Diagnosis: Unsteadiness on feet (R26.81);Difficulty in walking, not elsewhere classified (R26.2);Muscle weakness (generalized) (M62.81);Dizziness and giddiness (R42)    Time: 2376-2831 PT Time Calculation (min) (ACUTE ONLY): 30 min   Charges:   PT Evaluation $PT Eval Moderate Complexity: 1 Mod PT Treatments $Therapeutic Exercise: 8-22 mins        3:14 PM, 11/11/20 Rosamaria Lints, PT, DPT Physical Therapist - [facility]  [phone] (ASCOM)   Jamaul Heist C 11/11/2020, 3:08 PM

## 2020-11-11 NOTE — Consult Note (Signed)
Pharmacy Antibiotic Note  Jordan Gibson is a 40 y.o. male admitted on 11-18-2020 with increasing SOB and cough of 10 days duration. Rhinovirus positive He was started on IV ceftriaxone and Zithromax.   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 ID is following  Plan:  Initiate Bactrim 5mg /kg (dosed in trimethoprim) Q8H for total daily dose of 15 mg/kg/day  Monitor renal function and electrolytes (K+) closely   Height: 5\' 7"  (170.2 cm) Weight: 74.9 kg (165 lb 2 oz) IBW/kg (Calculated) : 66.1  Temp (24hrs), Avg:100 F (37.8 C), Min:99.3 F (37.4 C), Max:100.6 F (38.1 C)  Recent Labs  Lab 18-Nov-2020 2047 11/07/20 0653 11/08/20 0313 11/09/20 0416 11/10/20 0353 11/11/20 0650  WBC 9.2 8.9 12.5* 6.0 8.6 6.9  CREATININE 1.14 1.03 0.99 0.81 0.76 0.73  LATICACIDVEN 1.9 1.4 1.7  --   --   --     Estimated Creatinine Clearance: 113.6 mL/min (by C-G formula based on SCr of 0.73 mg/dL).    No Known Allergies  Antimicrobials this admission: 5/26 azithromycin >>  5/26 cefepime >> 5/30 5/27 Vancomycin > 5/28 5/31 Bactrim >>   Dose adjustments this admission: n/a  Microbiology results: 5/26 BCx: NG 5/28 Sputum: normal resp flora   5/27 MRSA PCR: neg  Thank you for allowing pharmacy to be a part of this patient's care.  6/28, PharmD, BCPS 11/11/2020 10:10 PM

## 2020-11-11 NOTE — Consult Note (Signed)
NAME: Jordan CooleyJose Jordan Gibson  DOB: 1980-08-31  MRN: [id number]  Date/Time: 11/11/2020 5:52 PM  REQUESTING PROVIDER: Dr. Ashok PallWouk Subjective:  REASON FOR CONSULT: HIV ? 57 Jordan Gibson is a 40 y.o. male with no PMH was admitted on 10/18/2020 with increasing SOB and cough of 10 days duration. Pt lives in [place]. [place]- been in [place] for 7 years, works in [employer]. His sister lives in [place] and she brought him to [facility] as he was not doing well In ED he was febrile with temp of 102, HR 132, RR 24 and sats at 52% RA. CXR showed b/Gibson infitrates. SARS cov2 neg- Rhinovirus positive. He was started on IV ceftriaxone and Zithromax. HE had to be admitted to ICU with worsening resp status and had to be intubated on 11/08/20. HE was placed on high dose steroids and was able to be extubated on 5/30 and transferred to stepdown A routine HIv test came back prelim positive and confirmatory test came back positive today . I am seeing the patient for same. Pt did not know he had HIV before- He has been sexually active with men- denies smoking, IV drugs, works in [employer]. No pets, No TB in the past History reviewed. No pertinent past medical history.  History reviewed. No pertinent surgical history.  Social History   Socioeconomic History  . Marital status: Single    Spouse name: Not on file  . Number of children: Not on file  . Years of education: Not on file  . Highest education level: Not on file  Occupational History  . Not on file  Tobacco Use  . Smoking status: Never Smoker  . Smokeless tobacco: Never Used  Substance and Sexual Activity  . Alcohol use: Yes    Alcohol/week: 1.0 standard drink    Types: 1 Cans of beer per week  . Drug use: Not on file  . Sexual activity: Not on file  Other Topics Concern  . Not on file  Social History Narrative  . Not on file     History reviewed. No pertinent family history. No Known Allergies I? Current  Facility-Administered Medications  Medication Dose Route Frequency Provider Last Rate Last Admin  . 0.9 %  sodium chloride infusion   Intravenous Continuous Jordan, Britton L, NP 50 mL/hr at 11/11/20 1522 New Bag at 11/11/20 1522  . 0.9 %  sodium chloride infusion  250 mL Intravenous Continuous Jordan, Britton L, NP 10 mL/hr at 11/10/20 0500 Infusion Verify at 11/10/20 0500  . azithromycin (ZITHROMAX) 500 mg in sodium chloride 0.9 % 250 mL IVPB  500 mg Intravenous Q24H Jordan KatosBradler, Evan K, MD   Stopped at 11/10/20 2245  . benzonatate (TESSALON) capsule 200 mg  200 mg Oral TID PRN Wouk, Jordan CurtisNoah Bedford, MD      . chlorhexidine (PERIDEX) 0.12 % solution 15 mL  15 mL Mouth Rinse BID Jordan RiggerAleskerov, Fuad, MD   15 mL at 11/11/20 1050  . Chlorhexidine Gluconate Cloth 2 % PADS 6 each  6 each Topical Daily Jordan RiggerAleskerov, Fuad, MD   6 each at 11/10/20 2156  . enoxaparin (LOVENOX) injection 40 mg  40 mg Subcutaneous Q24H Jordan Royaluncan, Hazel V, MD   40 mg at 11/11/20 1235  . guaiFENesin-dextromethorphan (ROBITUSSIN DM) 100-10 MG/5ML syrup 5 mL  5 mL Oral Q4H PRN Jordan RiggerAleskerov, Fuad, MD   5 mL at 11/11/20 1515  . HYDROcodone-acetaminophen (NORCO/VICODIN) 5-325 MG per tablet 1-2 tablet  1-2 tablet Per Tube Q4H PRN Jordan, Micheline RoughBritton  L, NP   1 tablet at 11/11/20 1515  . insulin aspart (novoLOG) injection 0-9 Units  0-9 Units Subcutaneous Q4H Jordan Gibson   2 Units at 11/10/20 2120  . MEDLINE mouth rinse  15 mL Mouth Rinse q12n4p Aleskerov, Fuad, MD   15 mL at 11/11/20 1235  . ondansetron (ZOFRAN) tablet 4 mg  4 mg Oral Q6H PRN Jordan Baumann, MD       Or  . ondansetron [facility]) injection 4 mg  4 mg Intravenous Q6H PRN Jordan Baumann, MD      . phenol (CHLORASEPTIC) mouth spray 1 spray  1 spray Mouth/Throat PRN Jordan Blinks, MD         Abtx:  Anti-infectives (From admission, onward)   Start     Dose/Rate Route Frequency Ordered Stop   11/08/20 0000  vancomycin (VANCOREADY) IVPB 1000 mg/200 mL  Status:   Discontinued        1,000 mg 200 mL/hr over 60 Minutes Intravenous Every 12 hours 11/07/20 1438 11/08/20 1132   11/07/20 1245  vancomycin (VANCOREADY) IVPB 1500 mg/300 mL        1,500 mg 150 mL/hr over 120 Minutes Intravenous  Once 11/07/20 1214 11/07/20 1457   11/07/20 1200  ceFEPIme (MAXIPIME) 2 g in sodium chloride 0.9 % 100 mL IVPB  Status:  Discontinued        2 g 200 mL/hr over 30 Minutes Intravenous Every 8 hours 11/07/20 1104 11/10/20 0957   11/07/20 0200  cefTRIAXone (ROCEPHIN) 2 g in sodium chloride 0.9 % 100 mL IVPB  Status:  Discontinued        2 g 200 mL/hr over 30 Minutes Intravenous Every 24 hours 11/07/20 0153 11/07/20 0155   11/07/20 0200  azithromycin (ZITHROMAX) 500 mg in sodium chloride 0.9 % 250 mL IVPB  Status:  Discontinued        500 mg 250 mL/hr over 60 Minutes Intravenous Every 24 hours 11/07/20 0153 11/07/20 0155   11/24/20 2100  cefTRIAXone (ROCEPHIN) 2 g in sodium chloride 0.9 % 100 mL IVPB  Status:  Discontinued        2 g 200 mL/hr over 30 Minutes Intravenous Every 24 hours 2020-11-24 2054 11/07/20 1056   11/24/2020 2100  azithromycin (ZITHROMAX) 500 mg in sodium chloride 0.9 % 250 mL IVPB        500 mg 250 mL/hr over 60 Minutes Intravenous Every 24 hours 2020-11-24 2054        REVIEW OF SYSTEMS:  Const:  fever,  chills, negative weight loss Eyes: negative diplopia or visual changes, negative eye pain ENT: negative coryza, negative sore throat Resp: cough, , dyspnea Cards:  chest pain, palpitations, no lower extremity edema GU: negative for frequency, dysuria and hematuria GI: Negative for abdominal pain, diarrhea, bleeding, constipation Skin: negative for rash and pruritus Heme: negative for easy bruising and gum/nose bleeding MS: generalized weakness Neurolo:negative for headaches, dizziness, vertigo, memory problems  Psych: negative for feelings of anxiety, depression  Endocrine: negative for thyroid, diabetes Allergy/Immunology- negative for any  medication or food allergies ?  Objective:  VITALS:  BP 114/73 (BP Location: Right Arm)   Pulse (!) 106   Temp 100.1 F (37.8 C)   Resp 18   Ht  (1.702 m)   Wt 74.9 kg   SpO2 91%   BMI 25.86 kg/m  PHYSICAL EXAM:  General: Alert, cooperative, has resp  distress, appears stated age.  Head: Normocephalic, without obvious abnormality, atraumatic. Eyes:  Conjunctivae clear, anicteric sclerae. Pupils are equal ENT Nares normal. No drainage or sinus tenderness. Lips, mucosa, and tongue normal. No Thrush Neck: Supple, symmetrical, no adenopathy, thyroid: non tender no carotid bruit and no JVD. Back: No CVA tenderness. Lungs: b/Gibson crepts Heart: Tachycardia. Abdomen: Soft, non-tender,not distended. Bowel sounds normal. No masses Extremities: atraumatic, no cyanosis. No edema. No clubbing Skin: No rashes or lesions. Or bruising Lymph: Cervical, supraclavicular normal. Neurologic: Grossly non-focal Pertinent Labs Lab Results CBC    Component Value Date/Time   WBC 6.9 11/11/2020 0650   RBC 4.54 11/11/2020 0650   HGB 12.7 (Gibson) 11/11/2020 0650   HCT 37.6 (Gibson) 11/11/2020 0650   PLT 307 11/11/2020 0650   MCV 82.8 11/11/2020 0650   MCH 28.0 11/11/2020 0650   MCHC 33.8 11/11/2020 0650   RDW 13.2 11/11/2020 0650   LYMPHSABS 0.6 (Gibson) 11/09/2020 0416   MONOABS 0.2 11/09/2020 0416   EOSABS 0.0 11/09/2020 0416   BASOSABS 0.0 11/09/2020 0416    CMP Latest Ref Rng & Units 11/11/2020 11/10/2020 11/09/2020  Glucose 70 - 99 mg/dL 88 627(O) 350(Gibson)  BUN 6 - 20 mg/dL 93(G) 18(E) 99(B)  Creatinine 0.61 - 1.24 mg/dL 7.16 9.67 8.93  Sodium 135 - 145 mmol/Gibson 133(Gibson) 135 137  Potassium 3.5 - 5.1 mmol/Gibson 3.7 3.8 4.7  Chloride 98 - 111 mmol/Gibson 98 101 107  CO2 22 - 32 mmol/Gibson 26 24 24   Calcium 8.9 - 10.3 mg/dL ) 8.1(O) 1.7(P)  Total Protein 6.5 - 8.1 g/dL - - 6.8  Total Bilirubin 0.3 - 1.2 mg/dL - - 0.7  Alkaline Phos 38 - 126 U/Gibson - - 98  AST 15 - 41 U/Gibson - - 33  ALT 0 - 44 U/Gibson - - 43       Microbiology: Recent Results (from the past 240 hour(s))  Resp Panel by RT-PCR (Flu A&B, Covid) Nasopharyngeal Swab     Status: None   Collection Time: 11/11/2020  8:47 PM   Specimen: Nasopharyngeal Swab; Nasopharyngeal(NP) swabs in vial transport medium  Result Value Ref Range Status   SARS Coronavirus 2 by RT PCR NEGATIVE NEGATIVE Final    Comment: (NOTE) SARS-CoV-2 target nucleic acids are NOT DETECTED.  The SARS-CoV-2 RNA is generally detectable in upper respiratory specimens during the acute phase of infection. The lowest concentration of SARS-CoV-2 viral copies this assay can detect is 138 copies/mL. A negative result does not preclude SARS-Cov-2 infection and should not be used as the sole basis for treatment or other patient management decisions. A negative result may occur with  improper specimen collection/handling, submission of specimen other than nasopharyngeal swab, presence of viral mutation(s) within the areas targeted by this assay, and inadequate number of viral copies(<138 copies/mL). A negative result must be combined with clinical observations, patient history, and epidemiological information. The expected result is Negative.  Fact Sheet for Patients:  11/08/20  Fact Sheet for Healthcare Providers:  BloggerCourse.com  This test is no t yet approved or cleared by the [place] FDA and  has been authorized for detection and/or diagnosis of SARS-CoV-2 by FDA under an Emergency Use Authorization (EUA). This EUA will remain  in effect (meaning this test can be used) for the duration of the COVID-19 declaration under Section 564(b)(1) of the Act, 21 U.S.C.section 360bbb-3(b)(1), unless the authorization is terminated  or revoked sooner.       Influenza A by PCR NEGATIVE NEGATIVE Final   Influenza B by PCR NEGATIVE NEGATIVE Final    Comment: (  NOTE) The Xpert Xpress SARS-CoV-2/FLU/RSV plus  assay is intended as an aid in the diagnosis of influenza from Nasopharyngeal swab specimens and should not be used as a sole basis for treatment. Nasal washings and aspirates are unacceptable for Xpert Xpress SARS-CoV-2/FLU/RSV testing.  Fact Sheet for Patients: BloggerCourse.com  Fact Sheet for Healthcare Providers: SeriousBroker.it  This test is not yet approved or cleared by the [place] FDA and has been authorized for detection and/or diagnosis of SARS-CoV-2 by FDA under an Emergency Use Authorization (EUA). This EUA will remain in effect (meaning this test can be used) for the duration of the COVID-19 declaration under Section 564(b)(1) of the Act, 21 U.S.C. section 360bbb-3(b)(1), unless the authorization is terminated or revoked.  Performed at [facility], [address] [place], [place] [address]   Culture, blood (Routine x 2)     Status: None   Collection Time: 10/14/2020  8:53 PM   Specimen: BLOOD  Result Value Ref Range Status   Specimen Description BLOOD RIGHT ANTECUBITAL  Final   Special Requests   Final    BOTTLES DRAWN AEROBIC AND ANAEROBIC Blood Culture adequate volume   Culture   Final    NO GROWTH 5 DAYS Performed at [facility], [address], [place], [place] [address]    Report Status 11/11/2020 FINAL  Final  Urine culture     Status: None   Collection Time: 11/02/2020  8:53 PM   Specimen: In/Out Cath Urine  Result Value Ref Range Status   Specimen Description   Final    IN/OUT CATH URINE Performed at [facility], [address] [place], [place] [address]    Special Requests   Final    NONE Performed at [facility], [address] [place], [place] [address]    Culture   Final    NO GROWTH Performed at [facility], [address], [place], [place] [address]    Report Status 11/08/2020 FINAL  Final  Culture, blood (Routine x 2)     Status:  None   Collection Time: 10/30/2020  8:58 PM   Specimen: BLOOD  Result Value Ref Range Status   Specimen Description BLOOD LEFT ANTECUBITAL  Final   Special Requests   Final    BOTTLES DRAWN AEROBIC AND ANAEROBIC Blood Culture adequate volume   Culture   Final    NO GROWTH 5 DAYS Performed at [facility], [address] [place], [place] [address]    Report Status 11/11/2020 FINAL  Final  MRSA PCR Screening     Status: None   Collection Time: 11/07/20 11:15 AM   Specimen: Nasal Mucosa; Nasopharyngeal  Result Value Ref Range Status   MRSA by PCR NEGATIVE NEGATIVE Final    Comment:        The GeneXpert MRSA Assay (FDA approved for NASAL specimens only), is one component of a comprehensive MRSA colonization surveillance program. It is not intended to diagnose MRSA infection nor to guide or monitor treatment for MRSA infections. Performed at [facility], [address] [place], [place] [address]   Expectorated Sputum Assessment w Gram Stain, Rflx to Resp Cult     Status: None   Collection Time: 11/07/20 11:55 AM   Specimen: Expectorated Sputum  Result Value Ref Range Status   Specimen Description EXPECTORATED SPUTUM  Final   Special Requests NONE  Final   Sputum evaluation   Final    Sputum specimen not acceptable for testing.  Please recollect.   C/JOHN [facility] AT 1227 11/07/20.PMF Performed at [facility], [address] [place], [place] [address]    Report Status 11/07/2020 FINAL  Final  Resp Panel by RT-PCR (Flu A&B, Covid) Nasopharyngeal Swab     Status: None   Collection Time: 11/08/20  3:08 AM   Specimen: Nasopharyngeal Swab; Nasopharyngeal(NP) swabs in vial transport medium  Result Value Ref Range Status   SARS Coronavirus 2 by RT PCR NEGATIVE NEGATIVE Final    Comment: (NOTE) SARS-CoV-2 target nucleic acids are NOT DETECTED.  The SARS-CoV-2 RNA is generally detectable in upper respiratory specimens during the acute phase of  infection. The lowest concentration of SARS-CoV-2 viral copies this assay can detect is 138 copies/mL. A negative result does not preclude SARS-Cov-2 infection and should not be used as the sole basis for treatment or other patient management decisions. A negative result may occur with  improper specimen collection/handling, submission of specimen other than nasopharyngeal swab, presence of viral mutation(s) within the areas targeted by this assay, and inadequate number of viral copies(<138 copies/mL). A negative result must be combined with clinical observations, patient history, and epidemiological information. The expected result is Negative.  Fact Sheet for Patients:  BloggerCourse.com  Fact Sheet for Healthcare Providers:  SeriousBroker.it  This test is no t yet approved or cleared by the [place] FDA and  has been authorized for detection and/or diagnosis of SARS-CoV-2 by FDA under an Emergency Use Authorization (EUA). This EUA will remain  in effect (meaning this test can be used) for the duration of the COVID-19 declaration under Section 564(b)(1) of the Act, 21 U.S.C.section 360bbb-3(b)(1), unless the authorization is terminated  or revoked sooner.       Influenza A by PCR NEGATIVE NEGATIVE Final   Influenza B by PCR NEGATIVE NEGATIVE Final    Comment: (NOTE) The Xpert Xpress SARS-CoV-2/FLU/RSV plus assay is intended as an aid in the diagnosis of influenza from Nasopharyngeal swab specimens and should not be used as a sole basis for treatment. Nasal washings and aspirates are unacceptable for Xpert Xpress SARS-CoV-2/FLU/RSV testing.  Fact Sheet for Patients: BloggerCourse.com  Fact Sheet for Healthcare Providers: SeriousBroker.it  This test is not yet approved or cleared by the [place] FDA and has been authorized for detection and/or diagnosis of SARS-CoV-2  by FDA under an Emergency Use Authorization (EUA). This EUA will remain in effect (meaning this test can be used) for the duration of the COVID-19 declaration under Section 564(b)(1) of the Act, 21 U.S.C. section 360bbb-3(b)(1), unless the authorization is terminated or revoked.  Performed at [facility], [address] [place], [place] [address]   Culture, Respiratory w Gram Stain     Status: None   Collection Time: 11/08/20  4:37 AM   Specimen: Tracheal Aspirate; Respiratory  Result Value Ref Range Status   Specimen Description   Final    TRACHEAL ASPIRATE Performed at [facility], [address] [place], [place] [address]    Special Requests   Final    NONE Performed at [facility], [address] [place], [place] [address]    Gram Stain   Final    MODERATE WBC PRESENT,BOTH PMN AND MONONUCLEAR FEW GRAM POSITIVE COCCI IN CLUSTERS RARE GRAM POSITIVE RODS    Culture   Final    Normal respiratory flora-no Staph aureus or Pseudomonas seen Performed at [facility], [address] [place], [place] [address]    Report Status 11/10/2020  FINAL  Final  Respiratory (~20 pathogens) panel by PCR     Status: Abnormal   Collection Time: 11/08/20 12:50 PM   Specimen: Nasopharyngeal Swab; Respiratory  Result Value Ref Range Status   Adenovirus NOT DETECTED NOT DETECTED Final   Coronavirus 229E NOT DETECTED NOT DETECTED Final    Comment: (NOTE) The Coronavirus on the Respiratory Panel, DOES NOT test for the novel  Coronavirus (2019 nCoV)    Coronavirus HKU1 NOT DETECTED NOT DETECTED Final   Coronavirus NL63 NOT DETECTED NOT DETECTED Final   Coronavirus OC43 NOT DETECTED NOT DETECTED Final   Metapneumovirus NOT DETECTED NOT DETECTED Final   Rhinovirus / Enterovirus DETECTED (A) NOT DETECTED Final   Influenza A NOT DETECTED NOT DETECTED Final   Influenza B NOT DETECTED NOT DETECTED Final   Parainfluenza Virus 1 NOT DETECTED NOT DETECTED Final    Parainfluenza Virus 2 NOT DETECTED NOT DETECTED Final   Parainfluenza Virus 3 NOT DETECTED NOT DETECTED Final   Parainfluenza Virus 4 NOT DETECTED NOT DETECTED Final   Respiratory Syncytial Virus NOT DETECTED NOT DETECTED Final   Bordetella pertussis NOT DETECTED NOT DETECTED Final   Bordetella Parapertussis NOT DETECTED NOT DETECTED Final   Chlamydophila pneumoniae NOT DETECTED NOT DETECTED Final   Mycoplasma pneumoniae NOT DETECTED NOT DETECTED Final    Comment: Performed at [facility], [address], [place], [place] [address]  Culture, Respiratory w Gram Stain     Status: None (Preliminary result)   Collection Time: 11/08/20 12:55 PM   Specimen: Tracheal Aspirate; Respiratory  Result Value Ref Range Status   Specimen Description   Final    TRACHEAL ASPIRATE Performed at [facility], [address], [place], [place] [address]    Special Requests   Final    NONE Performed at [facility], [address] [place], [place] [address]    Gram Stain   Final    FEW WBC PRESENT,BOTH PMN AND MONONUCLEAR NO ORGANISMS SEEN    Culture   Final    Normal respiratory flora-no Staph aureus or Pseudomonas seen Performed at [facility], [address], [place], [place] [address]    Report Status PENDING  Incomplete    IMAGING RESULTS:  I have personally reviewed the films ? Impression/Recommendation ?   Acute hypoxic respiratory failure with b/Gibson pulmonary infiltrates . Was intubated and started on high dose steroids and was able to be extubated in 48 hrs concerning for PJP. Will send Beta D glucan, fungal antibodies, crypto antigen, toxo antibodies, histoplasma urine antigen, quant gold Will start IV bactrim 5 mg/kg of trimethoprim Q 8 + prednisone 40mg  PO BID Pulmonary consult for bronch may be needed if blood tests does not corroborate PJP diagnosis  Newly diagnosed HIV/AIDS- cd4 sent, HIV RNA sent HAART will be started in 2  weeks  Transaminitis- neg hep c/ B screen screen ? Discussed the diagnosis, prognosis and management with the patient. He did not want his sister to know about his diagnosis. Reassured him that his diagnosis will not be divulged to his sister and he can tell her if need be?. He has no insurance and hence need to arrange meds thru [facility] ___________________________________________________ Discussed with hospitalist Note:  This document was prepared using Dragon voice recognition software and may include unintentional dictation errors.

## 2020-11-11 NOTE — Progress Notes (Signed)
   11/11/20 2029  Assess: MEWS Score  Temp 100.3 F (37.9 C)  BP 113/70  Pulse Rate (!) 122  Resp 20  Level of Consciousness Alert  SpO2 94 %  Assess: MEWS Score  MEWS Temp 0  MEWS Systolic 0  MEWS Pulse 2  MEWS RR 0  MEWS LOC 0  MEWS Score 2  MEWS Score Color Yellow  Assess: if the MEWS score is Yellow or Red  Were vital signs taken at a resting state? Yes  Focused Assessment Change from prior assessment (see assessment flowsheet)  Early Detection of Sepsis Score *See Row Information* Low  MEWS guidelines implemented *See Row Information* Yes  Notify: Charge Nurse/RN  Name of Charge Nurse/RN Notified Yaakov Guthrie, CN  Date Charge Nurse/RN Notified 11/11/20  Time Charge Nurse/RN Notified 2030

## 2020-11-11 NOTE — Progress Notes (Addendum)
PROGRESS NOTE    Jordan Gibson Jordan Gibson  FVC:[id number] DOB: [birth year] DOA: 10/13/2020 PCP: Merryl Hacker, No  Outpatient Specialists: none    Brief Narrative:   Jordan Gibson is a 40 y.o. Male with no significant past medical history who presented to [facility] ED on 11/07/20 with a 1 week history of shortness of breath, nonproductive cough, and fever. He denied chest pain, abdominal pain, Nausea, vomiting, dysuria, lower extremity pain or swelling.  Denies recent travel or sick contacts.  ED Course:  Upon arrival to ED vitals included: fever with Temperature 102, pulse 132, RR 24, O2 sats 52% on room air.  He was placed on HFNC with improvement of O2 sats to the mid 90's.  Workup significant for WBC 9.2, procalcitonin 3.07, lactic acid 1.9, BNP 44, HS Troponin 65, sodium 126.  Urinalysis is unremarkable.  COVID-19 and Influenza PCR's are both negative.  Chest x-ray with symmetric bilateral pulmonary airspace disease concerning for atypical infection vs. Edema vs hemorrhage.  He met sepsis criteria and was started on IV Rocephin and  Azithromycin, along with fluid bolus.  He is to be admitted to [facility] unit by Hospitalist.  While in ED, he continues to require high amounts of supplemental O2 and continues to exhibit moderate respiratory distress.  He is at high risk for respiratory deterioration and need for intubation.   10/14/2020: Presented to ED, to be admitted to stepdown unit by hospitalist  11/07/2020: Increasing FiO2 requirements and worsening respiratory status, PCCM consulted due to high risk for deterioration and need for intubation. ABX broadened to Cefepime and Vancomycin, along with Azithromycin  11/08/20- patient with respiratory failure s/p ETT overnight. I met with family at bedside today and reviewed care plan.  Vancomycin dcd today MRSA pcr negative.   -11/09/20- working on weaning ventilator and liberating from MV post SBT.  Patient with severe ARDS. I met with family and reviewed  care plan. +Rhino/ENtero virus  11/10/20- patient improved, has been extubated to HFNC.  Tranferring to step down unit, signed out to [facility].   Assessment & Plan:   Active Problems:   CAP (community acquired pneumonia)   Sepsis ([place])   Acute respiratory failure with hypoxia ([place])   Hyponatremia   Transaminitis   # Acute hypoxic respiratory failure # ARDS # Community acquired pneumonia # Rapid hiv pos Respiratory panel positive for entero and rhino. hiv rapid test positive. S/p intubation, now on hf nasal cannula, symptomatically improving. Cultures ngtd. I have not yet shared with the patient the rapid hiv positive; have decided to wait until confirmatory testing returns. - con azith - cont hfnc, wean as able - in light of positive rapid hiv (rna pending), I touched base w/ ID about treating for opportunistic infections. Pt is not leukopenic. cd4 count pending. ID (Ravishankar) advises awaiting results of confirmatory testing. 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 No signs withdrawal - monitor  # Hyponatremia Likely siadh from pulmonary process. Mild currently - monitor   DVT prophylaxis: lovenox Code Status: full Family Communication: sister updated @ bedside 5/31  Level of care: Progressive Cardiac Status is: Inpatient  Remains inpatient appropriate because:Inpatient level of care appropriate due to severity of illness   Dispo: The patient is from: Home              Anticipated d/c is to: Home. Pt eval ordered  Patient currently is not medically stable to d/c.   Difficult to place patient No    Consultants:  none  Procedures: none  Antimicrobials:  azithromycin    Subjective: This morning says breathing improving. Dry cough. Tolerating diet.  Objective: Vitals:   11/11/20 0448 11/11/20 0536 11/11/20 0812 11/11/20  0900  BP: 112/71  118/81   Pulse: (!) 103  98   Resp: 17  18   Temp: 99.3 F (37.4 C)  99.6 F (37.6 C)   TempSrc:      SpO2: 96%  91% 92%  Weight:  74.9 kg    Height:        Intake/Output Summary (Last 24 hours) at 11/11/2020 0936 Last data filed at 11/10/2020 2245 Gross per 24 hour  Intake --  Output 2275 ml  Net -2275 ml   Filed Weights   10/24/2020 2040 11/07/20 2000 11/11/20 0536  Weight: 77.1 kg 80.1 kg 74.9 kg    Examination:  General exam: Appears calm and comfortable  Respiratory system: scattered rales Cardiovascular system: S1 & S2 heard, RRR. No JVD, murmurs, rubs, gallops or clicks. No pedal edema. Gastrointestinal system: Abdomen is nondistended, soft and nontender. No organomegaly or masses felt. Normal bowel sounds heard. Central nervous system: Alert and oriented. No focal neurological deficits. Extremities: Symmetric 5 x 5 power. Skin: No rashes, lesions or ulcers Psychiatry: Judgement and insight appear normal. Mood & affect appropriate.     Data Reviewed: I have personally reviewed following labs and imaging studies  CBC: Recent Labs  Lab 10/26/2020 2047 11/07/20 0653 11/08/20 0313 11/09/20 0416 11/10/20 0353 11/11/20 0650  WBC 9.2 8.9 12.5* 6.0 8.6 6.9  NEUTROABS 7.2  --   --  5.1  --   --   HGB 13.0 12.0* 12.5* 10.9* 11.6* 12.7*  HCT 38.8* 35.1* 38.8* 33.8* 35.2* 37.6*  MCV 82.9 82.8 86.0 85.8 83.8 82.8  PLT 324 268 303 242 290 867   Basic Metabolic Panel: Recent Labs  Lab 11/07/20 0653 11/08/20 0313 11/09/20 0416 11/10/20 0353 11/11/20 0650  NA 129* 130* 137 135 133*  K 4.1 3.7 4.7 3.8 3.7  CL 97* 97* 107 101 98  CO2 '22 24 24 24 26  ' GLUCOSE 92 109* 170* 102* 88  BUN 15 15 24* 25* 22*  CREATININE 1.03 0.99 0.81 0.76 0.73  CALCIUM 8.0* 8.0* 8.8* 8.5* 8.6*  MG  --  2.1 2.7* 2.5* 2.2  PHOS  --  5.9* 4.9* 4.0 3.7   GFR: Estimated Creatinine Clearance: 113.6 mL/min (by C-G formula based on SCr of 0.73 mg/dL). Liver Function  Tests: Recent Labs  Lab 10/29/2020 2047 11/09/20 0416 11/10/20 0353  AST 67* 33  --   ALT 54* 43  --   ALKPHOS 129* 98  --   BILITOT 0.7 0.7  --   PROT 8.4* 6.8  --   ALBUMIN 2.7* 2.0* 2.0*   No results for input(s): LIPASE, AMYLASE in the last 168 hours. No results for input(s): AMMONIA in the last 168 hours. Coagulation Profile: Recent Labs  Lab 10/31/2020 2047 11/07/20 0653 11/08/20 0313  INR 1.1 1.2 1.3*   Cardiac Enzymes: No results for input(s): CKTOTAL, CKMB, CKMBINDEX, TROPONINI in the last 168 hours. BNP (last 3 results) No results for input(s): PROBNP in the last 8760 hours. HbA1C: No results for input(s): HGBA1C in the last 72 hours. CBG: Recent Labs  Lab 11/10/20 1802 11/10/20 1950 11/10/20 2322 11/11/20 0446 11/11/20 0811  GLUCAP 80 155* 108*  37 85   Lipid Profile: Recent Labs    11/09/20 0416  TRIG 379*   Thyroid Function Tests: No results for input(s): TSH, T4TOTAL, FREET4, T3FREE, THYROIDAB in the last 72 hours. Anemia Panel: No results for input(s): VITAMINB12, FOLATE, FERRITIN, TIBC, IRON, RETICCTPCT in the last 72 hours. Urine analysis:    Component Value Date/Time   COLORURINE YELLOW (A) 10/14/2020 2053   APPEARANCEUR HAZY (A) 10/25/2020 2053   LABSPEC 1.014 10/12/2020 2053   PHURINE 6.0 10/17/2020 2053   GLUCOSEU NEGATIVE 10/16/2020 2053   HGBUR SMALL (A) 10/26/2020 2053   BILIRUBINUR NEGATIVE 10/25/2020 2053   [place] NEGATIVE 10/30/2020 2053   PROTEINUR 30 (A) 11/11/2020 2053   NITRITE NEGATIVE 10/22/2020 2053   LEUKOCYTESUR NEGATIVE 10/18/2020 2053   Sepsis Labs: '@LABRCNTIP' (procalcitonin:4,lacticidven:4)  ) Recent Results (from the past 240 hour(s))  Resp Panel by RT-PCR (Flu A&B, Covid) Nasopharyngeal Swab     Status: None   Collection Time: 11/07/2020  8:47 PM   Specimen: Nasopharyngeal Swab; Nasopharyngeal(NP) swabs in vial transport medium  Result Value Ref Range Status   SARS Coronavirus 2 by RT PCR NEGATIVE NEGATIVE  Final    Comment: (NOTE) SARS-CoV-2 target nucleic acids are NOT DETECTED.  The SARS-CoV-2 RNA is generally detectable in upper respiratory specimens during the acute phase of infection. The lowest concentration of SARS-CoV-2 viral copies this assay can detect is 138 copies/mL. A negative result does not preclude SARS-Cov-2 infection and should not be used as the sole basis for treatment or other patient management decisions. A negative result may occur with  improper specimen collection/handling, submission of specimen other than nasopharyngeal swab, presence of viral mutation(s) within the areas targeted by this assay, and inadequate number of viral copies(<138 copies/mL). A negative result must be combined with clinical observations, patient history, and epidemiological information. The expected result is Negative.  Fact Sheet for Patients:  EntrepreneurPulse.com.au  Fact Sheet for Healthcare Providers:  IncredibleEmployment.be  This test is no t yet approved or cleared by the [place] FDA and  has been authorized for detection and/or diagnosis of SARS-CoV-2 by FDA under an Emergency Use Authorization (EUA). This EUA will remain  in effect (meaning this test can be used) for the duration of the COVID-19 declaration under Section 564(b)(1) of the Act, 21 U.S.C.section 360bbb-3(b)(1), unless the authorization is terminated  or revoked sooner.       Influenza A by PCR NEGATIVE NEGATIVE Final   Influenza B by PCR NEGATIVE NEGATIVE Final    Comment: (NOTE) The Xpert Xpress SARS-CoV-2/FLU/RSV plus assay is intended as an aid in the diagnosis of influenza from Nasopharyngeal swab specimens and should not be used as a sole basis for treatment. Nasal washings and aspirates are unacceptable for Xpert Xpress SARS-CoV-2/FLU/RSV testing.  Fact Sheet for Patients: EntrepreneurPulse.com.au  Fact Sheet for Healthcare  Providers: IncredibleEmployment.be  This test is not yet approved or cleared by the [place] FDA and has been authorized for detection and/or diagnosis of SARS-CoV-2 by FDA under an Emergency Use Authorization (EUA). This EUA will remain in effect (meaning this test can be used) for the duration of the COVID-19 declaration under Section 564(b)(1) of the Act, 21 U.S.C. section 360bbb-3(b)(1), unless the authorization is terminated or revoked.  Performed at [facility], [address] [place], [place] [address]   Culture, blood (Routine x 2)     Status: None   Collection Time: 10/12/2020  8:53 PM   Specimen: BLOOD  Result Value Ref Range Status  Specimen Description BLOOD RIGHT ANTECUBITAL  Final   Special Requests   Final    BOTTLES DRAWN AEROBIC AND ANAEROBIC Blood Culture adequate volume   Culture   Final    NO GROWTH 5 DAYS Performed at [facility], [address] [place], [place] [address]    Report Status 11/11/2020 FINAL  Final  Urine culture     Status: None   Collection Time: 10/20/2020  8:53 PM   Specimen: In/Out Cath Urine  Result Value Ref Range Status   Specimen Description   Final    IN/OUT CATH URINE Performed at [facility], [address] [place], [place] [address]    Special Requests   Final    NONE Performed at [facility], [address] [place], [place] [address]    Culture   Final    NO GROWTH Performed at [facility], [place] [address] [place], [place] [address]    Report Status 11/08/2020 FINAL  Final  Culture, blood (Routine x 2)     Status: None   Collection Time: 10/20/2020  8:58 PM   Specimen: BLOOD  Result Value Ref Range Status   Specimen Description BLOOD LEFT ANTECUBITAL  Final   Special Requests   Final    BOTTLES DRAWN AEROBIC AND ANAEROBIC Blood Culture adequate volume   Culture   Final    NO GROWTH 5 DAYS Performed at [facility], [address] [place], [place] [address]    Report Status 11/11/2020 FINAL  Final  MRSA PCR Screening     Status: None   Collection Time: 11/07/20 11:15 AM   Specimen: Nasal Mucosa; Nasopharyngeal  Result Value Ref Range Status   MRSA by PCR NEGATIVE NEGATIVE Final    Comment:        The GeneXpert MRSA Assay (FDA approved for NASAL specimens only), is one component of a comprehensive MRSA colonization surveillance program. It is not intended to diagnose MRSA infection nor to guide or monitor treatment for MRSA infections. Performed at [facility], [place], [place] [address]   Expectorated Sputum Assessment w Gram Stain, Rflx to Resp Cult     Status: None   Collection Time: 11/07/20 11:55 AM   Specimen: Expectorated Sputum  Result Value Ref Range Status   Specimen Description EXPECTORATED SPUTUM  Final   Special Requests NONE  Final   Sputum evaluation   Final    Sputum specimen not acceptable for testing.  Please recollect.   C/JOHN [facility] AT 1227 11/07/20.PMF Performed at [facility], [address] [place],  [address]    Report Status 11/07/2020 FINAL  Final  Resp Panel by RT-PCR (Flu A&B, Covid) Nasopharyngeal Swab     Status: None   Collection Time: 11/08/20  3:08 AM   Specimen: Nasopharyngeal Swab; Nasopharyngeal(NP) swabs in vial transport medium  Result Value Ref Range Status   SARS Coronavirus 2 by RT PCR NEGATIVE NEGATIVE Final    Comment: (NOTE) SARS-CoV-2 target nucleic acids are NOT DETECTED.  The SARS-CoV-2 RNA is generally detectable in upper respiratory specimens during the acute phase of infection. The lowest concentration of SARS-CoV-2 viral copies this assay can detect is 138 copies/mL. A negative result does not preclude SARS-Cov-2 infection and should not be used as the sole basis for treatment or other patient management decisions. A negative result may occur with  improper specimen collection/handling, submission of  specimen other than nasopharyngeal swab, presence of viral  mutation(s) within the areas targeted by this assay, and inadequate number of viral copies(<138 copies/mL). A negative result must be combined with clinical observations, patient history, and epidemiological information. The expected result is Negative.  Fact Sheet for Patients:  EntrepreneurPulse.com.au  Fact Sheet for Healthcare Providers:  IncredibleEmployment.be  This test is no t yet approved or cleared by the [place] FDA and  has been authorized for detection and/or diagnosis of SARS-CoV-2 by FDA under an Emergency Use Authorization (EUA). This EUA will remain  in effect (meaning this test can be used) for the duration of the COVID-19 declaration under Section 564(b)(1) of the Act, 21 U.S.C.section 360bbb-3(b)(1), unless the authorization is terminated  or revoked sooner.       Influenza A by PCR NEGATIVE NEGATIVE Final   Influenza B by PCR NEGATIVE NEGATIVE Final    Comment: (NOTE) The Xpert Xpress SARS-CoV-2/FLU/RSV plus assay is intended as an aid in the diagnosis of influenza from Nasopharyngeal swab specimens and should not be used as a sole basis for treatment. Nasal washings and aspirates are unacceptable for Xpert Xpress SARS-CoV-2/FLU/RSV testing.  Fact Sheet for Patients: EntrepreneurPulse.com.au  Fact Sheet for Healthcare Providers: IncredibleEmployment.be  This test is not yet approved or cleared by the [place] FDA and has been authorized for detection and/or diagnosis of SARS-CoV-2 by FDA under an Emergency Use Authorization (EUA). This EUA will remain in effect (meaning this test can be used) for the duration of the COVID-19 declaration under Section 564(b)(1) of the Act, 21 U.S.C. section 360bbb-3(b)(1), unless the authorization is terminated or revoked.  Performed at [facility], [address] [place], [place] [address]   Culture, Respiratory w Gram Stain     Status: None   Collection Time: 11/08/20  4:37 AM   Specimen: Tracheal Aspirate; Respiratory  Result Value Ref Range Status   Specimen Description   Final    TRACHEAL ASPIRATE Performed at [facility], [address] [place], [place] [address]    Special Requests   Final    NONE Performed at [facility], [address] [place], [place] [address]    Gram Stain   Final    MODERATE WBC PRESENT,BOTH PMN AND MONONUCLEAR FEW GRAM POSITIVE COCCI IN CLUSTERS RARE GRAM POSITIVE RODS    Culture   Final    Normal respiratory flora-no Staph aureus or Pseudomonas seen Performed at [facility], [place] [address] [place], [place] [address]    Report Status 11/10/2020 FINAL  Final  Respiratory (~20 pathogens) panel by PCR     Status: Abnormal   Collection Time: 11/08/20 12:50 PM   Specimen: Nasopharyngeal Swab; Respiratory  Result Value Ref Range Status   Adenovirus NOT DETECTED NOT DETECTED Final   Coronavirus 229E NOT DETECTED NOT DETECTED Final    Comment: (NOTE) The Coronavirus on the Respiratory Panel, DOES NOT test for the novel  Coronavirus (2019 nCoV)    Coronavirus HKU1 NOT DETECTED NOT DETECTED Final   Coronavirus NL63 NOT DETECTED NOT DETECTED Final   Coronavirus OC43 NOT DETECTED NOT DETECTED Final   Metapneumovirus NOT DETECTED NOT DETECTED Final   Rhinovirus / Enterovirus DETECTED (A) NOT DETECTED Final   Influenza A NOT DETECTED NOT DETECTED Final   Influenza B NOT DETECTED NOT DETECTED Final   Parainfluenza Virus 1 NOT DETECTED NOT DETECTED Final   Parainfluenza Virus 2 NOT DETECTED NOT DETECTED Final   Parainfluenza Virus 3 NOT DETECTED NOT DETECTED Final   Parainfluenza  Virus 4 NOT DETECTED NOT DETECTED Final   Respiratory Syncytial Virus NOT DETECTED NOT DETECTED Final   Bordetella pertussis NOT DETECTED NOT DETECTED Final   Bordetella Parapertussis NOT DETECTED NOT  DETECTED Final   Chlamydophila pneumoniae NOT DETECTED NOT DETECTED Final   Mycoplasma pneumoniae NOT DETECTED NOT DETECTED Final    Comment: Performed at [facility], [place] [address], [place], [place] [address]  Culture, Respiratory w Gram Stain     Status: None (Preliminary result)   Collection Time: 11/08/20 12:55 PM   Specimen: Tracheal Aspirate; Respiratory  Result Value Ref Range Status   Specimen Description   Final    TRACHEAL ASPIRATE Performed at [facility], [address] [place], [place] [address]    Special Requests   Final    NONE Performed at [facility], [address] [place], [place] [address]    Gram Stain   Final    FEW WBC PRESENT,BOTH PMN AND MONONUCLEAR NO ORGANISMS SEEN    Culture   Final    Normal respiratory flora-no Staph aureus or Pseudomonas seen Performed at [place] [address], [place], [place] [address]    Report Status PENDING  Incomplete         Radiology Studies: No results found.      Scheduled Meds: . chlorhexidine  15 mL Mouth Rinse BID  . Chlorhexidine Gluconate Cloth  6 each Topical Daily  . enoxaparin (LOVENOX) injection  40 mg Subcutaneous Q24H  . furosemide  20 mg Intravenous Daily  . insulin aspart  0-9 Units Subcutaneous Q4H  . mouth rinse  15 mL Mouth Rinse q12n4p   Continuous Infusions: . sodium chloride Stopped (11/08/20 0118)  . sodium chloride 10 mL/hr at 11/10/20 0500  . azithromycin Stopped (11/10/20 2245)     LOS: 5 days    Time spent: 13 min    Desma Maxim, MD [facility]   If 7PM-7AM, please contact night-coverage [email or web address] Password [facility] 11/11/2020, 9:36 AM

## 2020-11-12 ENCOUNTER — Inpatient Hospital Stay: Payer: Medicaid Other

## 2020-11-12 DIAGNOSIS — Z21 Asymptomatic human immunodeficiency virus [HIV] infection status: Secondary | ICD-10-CM | POA: Diagnosis present

## 2020-11-12 LAB — GLUCOSE, CAPILLARY
Glucose-Capillary: 117 mg/dL — ABNORMAL HIGH (ref 70–99)
Glucose-Capillary: 106 mg/dL — ABNORMAL HIGH (ref 70–99)
Glucose-Capillary: 107 mg/dL — ABNORMAL HIGH (ref 70–99)
Glucose-Capillary: 133 mg/dL — ABNORMAL HIGH (ref 70–99)
Glucose-Capillary: 181 mg/dL — ABNORMAL HIGH (ref 70–99)
Glucose-Capillary: 194 mg/dL — ABNORMAL HIGH (ref 70–99)

## 2020-11-12 LAB — BASIC METABOLIC PANEL
Anion gap: 8 (ref 5–15)
BUN: 18 mg/dL (ref 6–20)
CO2: 22 mmol/L (ref 22–32)
Calcium: 8.4 mg/dL — ABNORMAL LOW (ref 8.9–10.3)
Chloride: 99 mmol/L (ref 98–111)
Creatinine, Ser: 0.65 mg/dL (ref 0.61–1.24)
GFR, Estimated: >60 mL/min (ref >60)
Glucose, Bld: 135 mg/dL — ABNORMAL HIGH (ref 70–99)
Potassium: 4.3 mmol/L (ref 3.5–5.1)
Sodium: 129 mmol/L — ABNORMAL LOW (ref 135–145)

## 2020-11-12 LAB — HELPER T-LYMPH-CD4 (ARMC ONLY)
% CD 4 Pos. Lymph.: 1.5 % — ABNORMAL LOW (ref 30.8–58.5)
Absolute CD 4 Helper: 9 /uL — ABNORMAL LOW (ref 359–1519)
Basophils Absolute: 0 10*3/uL (ref 0.0–0.2)
Basos: 0 %
EOS (ABSOLUTE): 0.2 10*3/uL (ref 0.0–0.4)
Eos: 3 %
Hematocrit: 39.8 % (ref 37.5–51.0)
Hemoglobin: 12.5 g/dL — ABNORMAL LOW (ref 13.0–17.7)
Immature Grans (Abs): 0 10*3/uL (ref 0.0–0.1)
Immature Granulocytes: 0 %
Lymphocytes Absolute: 0.6 10*3/uL — ABNORMAL LOW (ref 0.7–3.1)
Lymphs: 8 %
MCH: 27 pg (ref 26.6–33.0)
MCHC: 31.4 g/dL — ABNORMAL LOW (ref 31.5–35.7)
MCV: 86 fL (ref 79–97)
Monocytes Absolute: 0.2 10*3/uL (ref 0.1–0.9)
Monocytes: 2 %
Neutrophils Absolute: 6 10*3/uL (ref 1.4–7.0)
Neutrophils: 87 %
Platelets: 293 10*3/uL (ref 150–450)
RBC: 4.63 x10E6/uL (ref 4.14–5.80)
RDW: 13.5 % (ref 11.6–15.4)
WBC: 7 10*3/uL (ref 3.4–10.8)

## 2020-11-12 LAB — RPR
RPR Ser Ql: REACTIVE — AB
RPR Titer: 1:4 {titer}

## 2020-11-12 LAB — CULTURE, RESPIRATORY W GRAM STAIN: Culture: NORMAL

## 2020-11-12 MED ORDER — PREDNISONE 20 MG PO TABS
40.0000 mg | ORAL_TABLET | Freq: Two times a day (BID) | ORAL | Status: DC
  Administered 2020-11-12 – 2020-11-14 (×4): 40 mg via ORAL
  Filled 2020-11-12 (×4): qty 2
  Filled 2020-11-12: qty 4

## 2020-11-12 MED ORDER — FUROSEMIDE 10 MG/ML IJ SOLN
20.0000 mg | Freq: Two times a day (BID) | INTRAMUSCULAR | Status: DC
  Administered 2020-11-12 – 2020-11-13 (×2): 20 mg via INTRAVENOUS
  Filled 2020-11-12 (×2): qty 2

## 2020-11-12 MED ORDER — PREDNISONE 20 MG PO TABS: 40.0000 mg | ORAL_TABLET | Freq: Every day | ORAL | Status: DC

## 2020-11-12 NOTE — Progress Notes (Signed)
   Date of Admission:  03-Dec-2020      ID: Jordan [place] is a 40 y.o. male  HIV CAP  Subjective: Stable Says he is feeling a little better Breathing slight improvement  Medications:  . chlorhexidine  15 mL Mouth Rinse BID  . Chlorhexidine Gluconate Cloth  6 each Topical Daily  . enoxaparin (LOVENOX) injection  40 mg Subcutaneous Q24H  . furosemide  20 mg Intravenous BID  . insulin aspart  0-9 Units Subcutaneous Q4H  . mouth rinse  15 mL Mouth Rinse q12n4p  . [START ON 11/13/2020] predniSONE  40 mg Oral Q breakfast    Objective: Vital signs in last 24 hours: Temp:  [98.6 F (37 C)-100.6 F (38.1 C)] 99.9 F (37.7 C) (06/01 1116) Pulse Rate:  [94-122] 105 (06/01 1116) Resp:  [18-20] 18 (06/01 1116) BP: (109-125)/(70-98) 115/78 (06/01 1116) SpO2:  [84 %-94 %] 88 % (06/01 1116) FiO2 (%):  [50 %-75 %] 75 % (06/01 0923)  PHYSICAL EXAM:  General: Alert, cooperative, Hi flo oxygen Head: Normocephalic, without obvious abnormality, atraumatic. Eyes: Conjunctivae clear, anicteric sclerae. Pupils are equal ENT Nares normal. No drainage or sinus tenderness. Lips, mucosa, and tongue normal. No Thrush Neck: Supple, symmetrical, no adenopathy, thyroid: non tender no carotid bruit and no JVD. Back: No CVA tenderness. Lungs: b/l air entry- crepts Heart: Regular rate and rhythm, no murmur, rub or gallop. Abdomen: Soft, non-tender,not distended. Bowel sounds normal. No masses Extremities: atraumatic, no cyanosis. No edema. No clubbing Skin: No rashes or lesions. Or bruising Lymph: Cervical, supraclavicular normal. Neurologic: Grossly non-focal  Lab Results Recent Labs    11/10/20 0353 11/11/20 0650 11/12/20 0537  WBC 8.6 6.9  --   HGB 11.6* 12.7*  --   HCT 35.2* 37.6*  --   NA 135 133* 129*  K 3.8 3.7 4.3  CL 101 98 99  CO2 24 26 22   BUN 25* 22* 18  CREATININE 0.76 0.73 0.65   Liver Panel Recent Labs    11/10/20 0353  ALBUMIN 2.0*     Assessment/Plan: Acute  hypoxic respiratory failure.  Status post intubation and then extubation.  Initially treated as community-acquired pneumonia but now that he has been diagnosed with HIV this is very likely PJP.   LDH is high at 544.  Beta D glucan has been sent.  He has been started on IV Bactrim yesterday.  He needs to take prednisone 40 mg twice daily.  He needs a tapering dose of steroids over a period of 21 days.  If beta D glucan which is Fungitell is normal then he will need bronchoscopy and further testing to rule out other opportunistic infection.  Rhinovirus in the respiratory PCR is unlikely the cause of this presentation.  AIDS patient will be started on antiretroviral a week or so after being on PCP treatment.  Discussed the management with patient

## 2020-11-12 NOTE — Progress Notes (Signed)
Physical Therapy Treatment Patient Details Name: Jordan Gibson MRN: [id number] DOB: [birth year] Today's Date: 11/12/2020    History of Present Illness Jordan Gibson is a 40yo Spanish speaking M who comes to [facility] on 2020/11/08  c SOB and fevers x 6 days. Pt has no PMH. Pt found to have (-) COVID19, (-) flu, admitted with CAP (atypical), hyponatremia, transaminitis. Pt with (+) routine HIV test as well. Pt requiring HHHF inititally, then quickly transitioning to ET intubation. PTA pt lives in [place], lives alone, works in Dietitian, came to area to be near his Sister when he became ill.    PT Comments    Pt in bed upon entry, agreeable to session, reports to feel a little better since yesterday's evaluation. Pt has not been OOB since, not to recliner. Pt still on HHHF 50L, FiO2 increased to 70 today duue to hypoxia in 80s%. In-person interpreter facilitates Spanish communication. Multiple sets of STS transfers, some desaturation after 4 reps that recovers with <1 minute. HR in 120s most of sitting time. Will continue to follow, plan on attempting some AMB next session if appropriate.    Follow Up Recommendations  Home health PT;Supervision - Intermittent     Equipment Recommendations  Rolling walker with 5" wheels    Recommendations for Other Services       Precautions / Restrictions Precautions Precautions: None Restrictions Weight Bearing Restrictions: No    Mobility  Bed Mobility Overal bed mobility: Needs Assistance Bed Mobility: Supine to Sit;Sit to Supine     Supine to sit: HOB elevated;Supervision Sit to supine: HOB elevated;Supervision   General bed mobility comments: less effort, minimal time, much improved since yesterday    Transfers Overall transfer level: Needs assistance Equipment used: None (chair back) Transfers: Sit to/from Stand           General transfer comment: 8x in session using chair back ( no RW in  room)  Ambulation/Gait Ambulation/Gait assistance:  (not ready)               Stairs             Wheelchair Mobility    Modified Rankin (Stroke Patients Only)       Balance Overall balance assessment: Modified Independent;Mild deficits observed, not formally tested                                          Cognition Arousal/Alertness: Awake/alert Behavior During Therapy: WFL for tasks assessed/performed Overall Cognitive Status: Within Functional Limits for tasks assessed                                 General Comments: still becomes a bit less interactive when up, but more alert compared to preveious day      Exercises Other Exercises Other Exercises: STS from EOB c BUE support 2 sets of 4, with standing 15-20sec each rep    General Comments        Pertinent Vitals/Pain Pain Assessment: No/denies pain    Home Living                      Prior Function            PT Goals (current goals can now be found in the care plan section) Acute Rehab PT  Goals Patient Stated Goal: Regain strength and activity tolerance PT Goal Formulation: With patient Time For Goal Achievement: 11/25/20 Potential to Achieve Goals: Good Progress towards PT goals: Progressing toward goals    Frequency    Min 2X/week      PT Plan Current plan remains appropriate    Co-evaluation              AM-PAC PT "6 Clicks" Mobility   Outcome Measure  Help needed turning from your back to your side while in a flat bed without using bedrails?: None Help needed moving from lying on your back to sitting on the side of a flat bed without using bedrails?: None Help needed moving to and from a bed to a chair (including a wheelchair)?: A Little Help needed standing up from a chair using your arms (e.g., wheelchair or bedside chair)?: A Little Help needed to walk in hospital room?: A Lot Help needed climbing 3-5 steps with a railing? :  A Lot 6 Click Score: 18    End of Session Equipment Utilized During Treatment: Oxygen Activity Tolerance: Patient tolerated treatment well;Patient limited by fatigue Patient left: in bed;with family/visitor present;with call bell/phone within reach Nurse Communication: Mobility status PT Visit Diagnosis: Unsteadiness on feet (R26.81);Difficulty in walking, not elsewhere classified (R26.2);Muscle weakness (generalized) (M62.81);Dizziness and giddiness (R42)     Time: 2355-7322 PT Time Calculation (min) (ACUTE ONLY): 22 min  Charges:  $Therapeutic Exercise: 8-22 mins                     4:34 PM, 11/12/20 Rosamaria Lints, PT, DPT Physical Therapist - [facility]  [phone] (ASCOM)    Nico Rogness C 11/12/2020, 4:32 PM

## 2020-11-12 NOTE — Progress Notes (Signed)
PROGRESS NOTE    Jordan Gibson  OYD:[id number] DOB: 1980-09-16 DOA: 10/15/2020 PCP: Merryl Hacker, No  Outpatient Specialists: none    Brief Narrative:   Jordan Gibson is a 40 y.o. Male with no significant past medical history who presented to [facility] ED on 11/07/20 with a 1 week history of shortness of breath, nonproductive cough, and fever. He denied chest pain, abdominal pain, Nausea, vomiting, dysuria, lower extremity pain or swelling.  Denies recent travel or sick contacts.  ED Course:  Upon arrival to ED vitals included: fever with Temperature 102, pulse 132, RR 24, O2 sats 52% on room air.  He was placed on HFNC with improvement of O2 sats to the mid 90's.  Workup significant for WBC 9.2, procalcitonin 3.07, lactic acid 1.9, BNP 44, HS Troponin 65, sodium 126.  Urinalysis is unremarkable.  COVID-19 and Influenza PCR's are both negative.  Chest x-ray with symmetric bilateral pulmonary airspace disease concerning for atypical infection vs. Edema vs hemorrhage.  He met sepsis criteria and was started on IV Rocephin and  Azithromycin, along with fluid bolus.  He is to be admitted to [facility] unit by Hospitalist.  While in ED, he continues to require high amounts of supplemental O2 and continues to exhibit moderate respiratory distress.  He is at high risk for respiratory deterioration and need for intubation.   11/01/2020: Presented to ED, to be admitted to stepdown unit by hospitalist  11/07/2020: Increasing FiO2 requirements and worsening respiratory status, PCCM consulted due to high risk for deterioration and need for intubation. ABX broadened to Cefepime and Vancomycin, along with Azithromycin  11/08/20- patient with respiratory failure s/p ETT overnight. I met with family at bedside today and reviewed care plan.  Vancomycin dcd today MRSA pcr negative.   -11/09/20- working on weaning ventilator and liberating from MV post SBT.  Patient with severe ARDS. I met with family and reviewed  care plan. +Rhino/ENtero virus  11/10/20- patient improved, has been extubated to HFNC.  Tranferring to step down unit, signed out to [facility].   Assessment & Plan:   Active Problems:   CAP (community acquired pneumonia)   Sepsis ([place])   Acute respiratory failure with hypoxia ([place])   Hyponatremia   Transaminitis   HIV positive ([place])   # Acute hypoxic respiratory failure due to severe ARDS from community-acquired pneumonia - treated with IV antibiotic.  Stopped antibiotics per ID -FiO2 increased to 75%.  Discussed with PCCM.  Cut back on fluids and added Lasix.  #Newly diagnosed HIV/AIDS Appreciate ID input.  COVID-19 and influenza PCR are negative. Patient is started on IV Bactrim plus prednisone per ID Start HAART in 2 weeks Further work-up for opportunistic infection in progress per ID/PCCM  # Etoh abuse Pt reports no alcohol for 1 month. No signs withdrawal - monitor  # Hyponatremia Likely siadh from pulmonary process. Mild currently - monitor   DVT prophylaxis: lovenox Code Status: full Family Communication: sister updated @ bedside 6/1  Level of care: Progressive Cardiac Status is: Inpatient  Remains inpatient appropriate because:Inpatient level of care appropriate due to severity of illness   Dispo: The patient is from: Home              Anticipated d/c is to: Home.              Patient currently is not medically stable to d/c.   Difficult to place patient No    Consultants:  ID PCCM   Subjective: Not complaining of shortness  of breath but he is requiring more oxygen today.  On high flow nasal cannula.  Low-grade fever, T-max of 100.6 last night persisting along with tachycardia.  Sister at bedside  Objective: Vitals:   11/12/20 0734 11/12/20 0923 11/12/20 1116 11/12/20 1555  BP: 124/85  115/78   Pulse: 100  (!) 105   Resp: 20  18   Temp: 99.9 F (37.7 C)  99.9 F (37.7 C)   TempSrc: Oral  Oral   SpO2: 94% 93% (!) 88% 91%  Weight:      Height:         Intake/Output Summary (Last 24 hours) at 11/12/2020 1612 Last data filed at 11/12/2020 1539 Gross per 24 hour  Intake 1940 ml  Output 2650 ml  Net -710 ml   Filed Weights   10/29/2020 2040 11/07/20 2000 11/11/20 0536  Weight: 77.1 kg 80.1 kg 74.9 kg    Examination:  General exam: Appears calm and comfortable  Respiratory system: scattered rales Cardiovascular system: S1 & S2 heard, RRR. No JVD, murmurs, rubs, gallops or clicks. No pedal edema. Gastrointestinal system: Abdomen is nondistended, soft and nontender. No organomegaly or masses felt. Normal bowel sounds heard. Central nervous system: Alert and oriented. No focal neurological deficits. Extremities: Symmetric 5 x 5 power. Skin: No rashes, lesions or ulcers Psychiatry: Judgement and insight appear normal. Mood & affect appropriate.     Data Reviewed: I have personally reviewed following labs and imaging studies  CBC: Recent Labs  Lab 10/20/2020 2047 11/07/20 0653 11/08/20 0313 11/09/20 0416 11/10/20 0353 11/11/20 0650  WBC 9.2 8.9 12.5* 6.0 8.6 6.9  NEUTROABS 7.2  --   --  5.1  --   --   HGB 13.0 12.0* 12.5* 10.9* 11.6* 12.7*  HCT 38.8* 35.1* 38.8* 33.8* 35.2* 37.6*  MCV 82.9 82.8 86.0 85.8 83.8 82.8  PLT 324 268 303 242 290 212   Basic Metabolic Panel: Recent Labs  Lab 11/08/20 0313 11/09/20 0416 11/10/20 0353 11/11/20 0650 11/12/20 0537  NA 130* 137 135 133* 129*  K 3.7 4.7 3.8 3.7 4.3  CL 97* 107 101 98 99  CO2 '24 24 24 26 22  ' GLUCOSE 109* 170* 102* 88 135*  BUN 15 24* 25* 22* 18  CREATININE 0.99 0.81 0.76 0.73 0.65  CALCIUM 8.0* 8.8* 8.5* 8.6* 8.4*  MG 2.1 2.7* 2.5* 2.2  --   PHOS 5.9* 4.9* 4.0 3.7  --    GFR: Estimated Creatinine Clearance: 113.6 mL/min (by C-G formula based on SCr of 0.65 mg/dL). Liver Function Tests: Recent Labs  Lab 11/01/2020 2047 11/09/20 0416 11/10/20 0353  AST 67* 33  --   ALT 54* 43  --   ALKPHOS 129* 98  --   BILITOT 0.7 0.7  --   PROT 8.4* 6.8  --    ALBUMIN 2.7* 2.0* 2.0*   No results for input(s): LIPASE, AMYLASE in the last 168 hours. No results for input(s): AMMONIA in the last 168 hours. Coagulation Profile: Recent Labs  Lab 11/02/2020 2047 11/07/20 0653 11/08/20 0313  INR 1.1 1.2 1.3*   Cardiac Enzymes: No results for input(s): CKTOTAL, CKMB, CKMBINDEX, TROPONINI in the last 168 hours. BNP (last 3 results) No results for input(s): PROBNP in the last 8760 hours. HbA1C: No results for input(s): HGBA1C in the last 72 hours. CBG: Recent Labs  Lab 11/11/20 2322 11/12/20 0221 11/12/20 0412 11/12/20 0750 11/12/20 1158  GLUCAP 228* 117* 106* 107* 133*   Lipid Profile: No results for  input(s): CHOL, HDL, LDLCALC, TRIG, CHOLHDL, LDLDIRECT in the last 72 hours. Thyroid Function Tests: No results for input(s): TSH, T4TOTAL, FREET4, T3FREE, THYROIDAB in the last 72 hours. Anemia Panel: No results for input(s): VITAMINB12, FOLATE, FERRITIN, TIBC, IRON, RETICCTPCT in the last 72 hours. Urine analysis:    Component Value Date/Time   COLORURINE YELLOW (A) 11/03/2020 2053   APPEARANCEUR HAZY (A) 10/18/2020 2053   LABSPEC 1.014 11/10/2020 2053   PHURINE 6.0 10/31/2020 2053   GLUCOSEU NEGATIVE 11/07/2020 2053   HGBUR SMALL (A) 10/25/2020 2053   BILIRUBINUR NEGATIVE 10/30/2020 2053   [place] NEGATIVE 10/22/2020 2053   PROTEINUR 30 (A) 11/10/2020 2053   NITRITE NEGATIVE 10/25/2020 2053   LEUKOCYTESUR NEGATIVE 11/07/2020 2053   Sepsis Labs: '@LABRCNTIP' (procalcitonin:4,lacticidven:4)  ) Recent Results (from the past 240 hour(s))  Resp Panel by RT-PCR (Flu A&B, Covid) Nasopharyngeal Swab     Status: None   Collection Time: 10/28/2020  8:47 PM   Specimen: Nasopharyngeal Swab; Nasopharyngeal(NP) swabs in vial transport medium  Result Value Ref Range Status   SARS Coronavirus 2 by RT PCR NEGATIVE NEGATIVE Final    Comment: (NOTE) SARS-CoV-2 target nucleic acids are NOT DETECTED.  The SARS-CoV-2 RNA is generally detectable  in upper respiratory specimens during the acute phase of infection. The lowest concentration of SARS-CoV-2 viral copies this assay can detect is 138 copies/mL. A negative result does not preclude SARS-Cov-2 infection and should not be used as the sole basis for treatment or other patient management decisions. A negative result may occur with  improper specimen collection/handling, submission of specimen other than nasopharyngeal swab, presence of viral mutation(s) within the areas targeted by this assay, and inadequate number of viral copies(<138 copies/mL). A negative result must be combined with clinical observations, patient history, and epidemiological information. The expected result is Negative.  Fact Sheet for Patients:  EntrepreneurPulse.com.au  Fact Sheet for Healthcare Providers:  IncredibleEmployment.be  This test is no t yet approved or cleared by the [place] FDA and  has been authorized for detection and/or diagnosis of SARS-CoV-2 by FDA under an Emergency Use Authorization (EUA). This EUA will remain  in effect (meaning this test can be used) for the duration of the COVID-19 declaration under Section 564(b)(1) of the Act, 21 U.S.C.section 360bbb-3(b)(1), unless the authorization is terminated  or revoked sooner.       Influenza A by PCR NEGATIVE NEGATIVE Final   Influenza B by PCR NEGATIVE NEGATIVE Final    Comment: (NOTE) The Xpert Xpress SARS-CoV-2/FLU/RSV plus assay is intended as an aid in the diagnosis of influenza from Nasopharyngeal swab specimens and should not be used as a sole basis for treatment. Nasal washings and aspirates are unacceptable for Xpert Xpress SARS-CoV-2/FLU/RSV testing.  Fact Sheet for Patients: EntrepreneurPulse.com.au  Fact Sheet for Healthcare Providers: IncredibleEmployment.be  This test is not yet approved or cleared by the [place] FDA and has  been authorized for detection and/or diagnosis of SARS-CoV-2 by FDA under an Emergency Use Authorization (EUA). This EUA will remain in effect (meaning this test can be used) for the duration of the COVID-19 declaration under Section 564(b)(1) of the Act, 21 U.S.C. section 360bbb-3(b)(1), unless the authorization is terminated or revoked.  Performed at [facility], [address] [place], [place] [address]   Culture, blood (Routine x 2)     Status: None   Collection Time: 10/13/2020  8:53 PM   Specimen: BLOOD  Result Value Ref Range Status   Specimen Description BLOOD RIGHT  ANTECUBITAL  Final   Special Requests   Final    BOTTLES DRAWN AEROBIC AND ANAEROBIC Blood Culture adequate volume   Culture   Final    NO GROWTH 5 DAYS Performed at [facility], [address] [place], [place] [address]    Report Status 11/11/2020 FINAL  Final  Urine culture     Status: None   Collection Time: 10/30/2020  8:53 PM   Specimen: In/Out Cath Urine  Result Value Ref Range Status   Specimen Description   Final    IN/OUT CATH URINE Performed at [facility], [address] [place], [place] [address]    Special Requests   Final    NONE Performed at [facility], [address], [place], [place] [address]    Culture   Final    NO GROWTH Performed at [facility], [place] [address] [place], [place] [address]    Report Status 11/08/2020 FINAL  Final  Culture, blood (Routine x 2)     Status: None   Collection Time: 10/23/2020  8:58 PM   Specimen: BLOOD  Result Value Ref Range Status   Specimen Description BLOOD LEFT ANTECUBITAL  Final   Special Requests   Final    BOTTLES DRAWN AEROBIC AND ANAEROBIC Blood Culture adequate volume   Culture   Final    NO GROWTH 5 DAYS Performed at [facility], [address] [place], [place] [address]    Report Status 11/11/2020 FINAL  Final  MRSA PCR Screening     Status: None   Collection Time: 11/07/20  11:15 AM   Specimen: Nasal Mucosa; Nasopharyngeal  Result Value Ref Range Status   MRSA by PCR NEGATIVE NEGATIVE Final    Comment:        The GeneXpert MRSA Assay (FDA approved for NASAL specimens only), is one component of a comprehensive MRSA colonization surveillance program. It is not intended to diagnose MRSA infection nor to guide or monitor treatment for MRSA infections. Performed at [facility], [place], [place] [address]   Expectorated Sputum Assessment w Gram Stain, Rflx to Resp Cult     Status: None   Collection Time: 11/07/20 11:55 AM   Specimen: Expectorated Sputum  Result Value Ref Range Status   Specimen Description EXPECTORATED SPUTUM  Final   Special Requests NONE  Final   Sputum evaluation   Final    Sputum specimen not acceptable for testing.  Please recollect.   C/JOHN [facility] AT 1227 11/07/20.PMF Performed at [facility], [address] [place], [place] [address]    Report Status 11/07/2020 FINAL  Final  Resp Panel by RT-PCR (Flu A&B, Covid) Nasopharyngeal Swab     Status: None   Collection Time: 11/08/20  3:08 AM   Specimen: Nasopharyngeal Swab; Nasopharyngeal(NP) swabs in vial transport medium  Result Value Ref Range Status   SARS Coronavirus 2 by RT PCR NEGATIVE NEGATIVE Final    Comment: (NOTE) SARS-CoV-2 target nucleic acids are NOT DETECTED.  The SARS-CoV-2 RNA is generally detectable in upper respiratory specimens during the acute phase of infection. The lowest concentration of SARS-CoV-2 viral copies this assay can detect is 138 copies/mL. A negative result does not preclude SARS-Cov-2 infection and should not be used as the sole basis for treatment or other patient management decisions. A negative result may occur with  improper specimen collection/handling, submission of specimen other than nasopharyngeal swab, presence of viral mutation(s) within the areas targeted  by this assay, and inadequate number  of viral copies(<138 copies/mL). A negative result must be combined with clinical observations, patient history, and epidemiological information. The expected result is Negative.  Fact Sheet for Patients:  EntrepreneurPulse.com.au  Fact Sheet for Healthcare Providers:  IncredibleEmployment.be  This test is no t yet approved or cleared by the [place] FDA and  has been authorized for detection and/or diagnosis of SARS-CoV-2 by FDA under an Emergency Use Authorization (EUA). This EUA will remain  in effect (meaning this test can be used) for the duration of the COVID-19 declaration under Section 564(b)(1) of the Act, 21 U.S.C.section 360bbb-3(b)(1), unless the authorization is terminated  or revoked sooner.       Influenza A by PCR NEGATIVE NEGATIVE Final   Influenza B by PCR NEGATIVE NEGATIVE Final    Comment: (NOTE) The Xpert Xpress SARS-CoV-2/FLU/RSV plus assay is intended as an aid in the diagnosis of influenza from Nasopharyngeal swab specimens and should not be used as a sole basis for treatment. Nasal washings and aspirates are unacceptable for Xpert Xpress SARS-CoV-2/FLU/RSV testing.  Fact Sheet for Patients: EntrepreneurPulse.com.au  Fact Sheet for Healthcare Providers: IncredibleEmployment.be  This test is not yet approved or cleared by the [place] FDA and has been authorized for detection and/or diagnosis of SARS-CoV-2 by FDA under an Emergency Use Authorization (EUA). This EUA will remain in effect (meaning this test can be used) for the duration of the COVID-19 declaration under Section 564(b)(1) of the Act, 21 U.S.C. section 360bbb-3(b)(1), unless the authorization is terminated or revoked.  Performed at [facility], [address] [place], [place] [address]   Culture, Respiratory w Gram Stain     Status: None   Collection Time: 11/08/20  4:37 AM   Specimen:  Tracheal Aspirate; Respiratory  Result Value Ref Range Status   Specimen Description   Final    TRACHEAL ASPIRATE Performed at [facility], [address], [place], [place] [address]    Special Requests   Final    NONE Performed at [facility], [address] [place], [place] [address]    Gram Stain   Final    MODERATE WBC PRESENT,BOTH PMN AND MONONUCLEAR FEW GRAM POSITIVE COCCI IN CLUSTERS RARE GRAM POSITIVE RODS    Culture   Final    Normal respiratory flora-no Staph aureus or Pseudomonas seen Performed at [facility], [place] [address], [place], [place] [address]    Report Status 11/10/2020 FINAL  Final  Respiratory (~20 pathogens) panel by PCR     Status: Abnormal   Collection Time: 11/08/20 12:50 PM   Specimen: Nasopharyngeal Swab; Respiratory  Result Value Ref Range Status   Adenovirus NOT DETECTED NOT DETECTED Final   Coronavirus 229E NOT DETECTED NOT DETECTED Final    Comment: (NOTE) The Coronavirus on the Respiratory Panel, DOES NOT test for the novel  Coronavirus (2019 nCoV)    Coronavirus HKU1 NOT DETECTED NOT DETECTED Final   Coronavirus NL63 NOT DETECTED NOT DETECTED Final   Coronavirus OC43 NOT DETECTED NOT DETECTED Final   Metapneumovirus NOT DETECTED NOT DETECTED Final   Rhinovirus / Enterovirus DETECTED (A) NOT DETECTED Final   Influenza A NOT DETECTED NOT DETECTED Final   Influenza B NOT DETECTED NOT DETECTED Final   Parainfluenza Virus 1 NOT DETECTED NOT DETECTED Final   Parainfluenza Virus 2 NOT DETECTED NOT DETECTED Final   Parainfluenza Virus 3 NOT DETECTED NOT DETECTED Final   Parainfluenza Virus 4 NOT DETECTED  NOT DETECTED Final   Respiratory Syncytial Virus NOT DETECTED NOT DETECTED Final   Bordetella pertussis NOT DETECTED NOT DETECTED Final   Bordetella Parapertussis NOT DETECTED NOT DETECTED Final   Chlamydophila pneumoniae NOT DETECTED NOT DETECTED Final   Mycoplasma pneumoniae NOT DETECTED NOT DETECTED Final     Comment: Performed at [facility], [place] [address], [place], [place] [address]  Culture, Respiratory w Gram Stain     Status: None   Collection Time: 11/08/20 12:55 PM   Specimen: Tracheal Aspirate; Respiratory  Result Value Ref Range Status   Specimen Description   Final    TRACHEAL ASPIRATE Performed at [facility], [address], [place], [place] [address]    Special Requests   Final    NONE Performed at [facility], [address] [place], [place] [address]    Gram Stain   Final    FEW WBC PRESENT,BOTH PMN AND MONONUCLEAR NO ORGANISMS SEEN    Culture   Final    Normal respiratory flora-no Staph aureus or Pseudomonas seen Performed at [facility] [address], [place], [place] [address]    Report Status 11/12/2020 FINAL  Final         Radiology Studies: DG Chest 2 View  Result Date: 11/12/2020 CLINICAL DATA:  Shortness of breath EXAM: CHEST - 2 VIEW COMPARISON:  11/09/2020 FINDINGS: Endotracheal tube and orogastric tube have been removed. Lung volumes are diminished following extubation. Widespread diffuse pulmonary density persists which could be due to a combination of pneumonia and edema. No visible effusion. IMPRESSION: Removal of endotracheal tube and orogastric tube. Lung volumes diminished following extubation. Widespread pulmonary density persists which could be due to a combination of pneumonia and edema. Electronically Signed   By: Nelson Chimes M.D.   On: 11/12/2020 14:27        Scheduled Meds: . chlorhexidine  15 mL Mouth Rinse BID  . Chlorhexidine Gluconate Cloth  6 each Topical Daily  . enoxaparin (LOVENOX) injection  40 mg Subcutaneous Q24H  . furosemide  20 mg Intravenous BID  . insulin aspart  0-9 Units Subcutaneous Q4H  . mouth rinse  15 mL Mouth Rinse q12n4p  . predniSONE  40 mg Oral BID WC   Continuous Infusions: . sodium chloride 10 mL/hr at 11/10/20 0500  . sulfamethoxazole-trimethoprim 375 mg of  trimethoprim (11/12/20 1407)     LOS: 6 days    Time spent: 35 min    Mikeisha Lemonds Manuella Ghazi, MD [facility]   If 7PM-7AM, please contact night-coverage [email or web address] Password [facility] 11/12/2020, 4:12 PM

## 2020-11-12 NOTE — Progress Notes (Signed)
NAME:  Jordan Gibson, MRN:  [id number], DOB:  11-Oct-1980, LOS: 6 ADMISSION DATE:  10/19/2020, CONSULTATION DATE:  11/07/2020 REFERRING MD:  Dr. Roderic Palau, CHIEF COMPLAINT:  Shortness of breath, cough   Brief Pt Description/Synopsis:  40 y.o. Male admitted with Acute Hypoxic Respiratory Failure and Sepsis in the setting of Community Acquired Pneumonia.   History of Present Illness:  Jordan Gibson is a 40 y.o. Male with no significant past medical history who presented to [facility] ED on 11/07/20 with a 1 week history of shortness of breath, nonproductive cough, and fever. He denied chest pain, abdominal pain, Nausea, vomiting, dysuria, lower extremity pain or swelling.  Denies recent travel or sick contacts.  ED Course:  Upon arrival to ED vitals included: fever with Temperature 102, pulse 132, RR 24, O2 sats 52% on room air.  He was placed on HFNC with improvement of O2 sats to the mid 90's.  Workup significant for WBC 9.2, procalcitonin 3.07, lactic acid 1.9, BNP 44, HS Troponin 65, sodium 126.  Urinalysis is unremarkable.  COVID-19 and Influenza PCR's are both negative.  Chest x-ray with symmetric bilateral pulmonary airspace disease concerning for atypical infection vs. Edema vs hemorrhage.  He met sepsis criteria and was started on IV Rocephin and  Azithromycin, along with fluid bolus.  He is to be admitted to [facility] unit by Hospitalist.  While in ED, he continues to require high amounts of supplemental O2 and continues to exhibit moderate respiratory distress.  He is at high risk for respiratory deterioration and need for intubation.  PCCM is consulted for further assistance and management of Acute Hypoxic Respiratory Failure and Sepsis in the setting of Community Acquired Pneumonia.    Significant Hospital Events: Including procedures, antibiotic start and stop dates in addition to other pertinent events   . 11/07/2020: Presented to ED, to be admitted to stepdown unit by  hospitalist . 11/07/2020: Increasing FiO2 requirements and worsening respiratory status, PCCM consulted due to high risk for deterioration and need for intubation. ABX broadened to Cefepime and Vancomycin, along with Azithromycin . 11/08/20- patient with respiratory failure s/p ETT overnight. I met with family at bedside today and reviewed care plan.  Vancomycin dcd today MRSA pcr negative.  . -11/09/20- working on weaning ventilator and liberating from MV post SBT.  Patient with severe ARDS. I met with family and reviewed care plan. +Rhino/ENtero virus  11/10/20- patient improved, has been extubated to HFNC.  Tranferring to step down unit, signed out to [facility].   11/12/20- FiO2 increased to 75%, dcd IV NS at 50/h, have aded lasix 20 bid, decreased prednisone to 40 once daily, stopped po vicodin.  Metane[employer] ordered BID with RT for recruitment.  Repeat CXR today   Cultures:  11/07/2020: SARS-CoV-2 and influenza PCR>> negative 10/30/2020: Blood culture x2>> 10/24/2020: Urine culture>> 11/07/2020: Sputum>> 11/07/2020: Strep pneumo urinary antigen>> 11/07/2020: Legionella urinary antigen>> 11/07/2020: Mycoplasma pneumonia>> 11/07/2020: Respiratory viral panel>> 11/07/2020: MRSA PCR>>  Antimicrobials:  Azithromycin 5/26>> Ceftriaxone 5/26>> 5/27 Cefepime 5/27>> Vancomycin 5/27>>     Objective   Blood pressure 124/85, pulse 100, temperature 99.9 F (37.7 C), temperature source Oral, resp. rate 20, height '5\' 7"'  (1.702 m), weight 74.9 kg, SpO2 93 %.    FiO2 (%):  [50 %-75 %] 75 %   Intake/Output Summary (Last 24 hours) at 11/12/2020 1049 Last data filed at 11/12/2020 0833 Gross per 24 hour  Intake 1940 ml  Output 2350 ml  Net -410 ml   [employer]  10/23/2020 2040 11/07/20 2000 11/11/20 0536  Weight: 77.1 kg 80.1 kg 74.9 kg    Examination:  General:  Age appropriate on HFNC in no distress HENT: Atraumatic, normocephalic, neck supple, no JVD Lungs: rhonch bilateraly Cardiovascular:  Tachycardia, regular rhythm, S1-S2, no murmurs, rubs, gallops, 2+ radial and DP pulses Abdomen: Soft, nontender, nondistended, no guarding rebound tenderness, bowel sounds positive x4 Extremities: Normal bulk and tone, no deformities, no edema Neuro: awake alert lucid no FND grossly GU: Deferred Skin: Warm and diaphoretic.  No obvious rashes, lesions, ulcerations    Resolved Hospital Problem list   N/A   Assessment & Plan:   Acute Hypoxic Respiratory Failure in the setting of SEVERE ARDS due to Community Acquired Pneumonia -on HFNC -Tracheal aspriate-+Viral infection -MRSA negative - dcd vancomycin -extubated 11/09/20 -tracheal aspirate -RVP + entero/rhino virus -additional microbiology in process -fungal and bacterial    Severe ARDS due to viral pneumonia- improved s/p extubation from ventilator  Respiratory Viral Panel -+entero/rvp -dc cefepime /vanoc -c/w zithromax  -dc solumedrol -pt/ot -incentive spirometry -continue diuresis - reduced lasix to 20 iv daily   Alcoholism    - patient admits to chronic alcoholism   -patient admits to symptoms of withdrawal   - CIWA protocol acivated   - patient on PRN benzodiazepines     Moderate protein calorie malnutrition  bitemporal wasting  - low albumin   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  High risk for deterioration, need for intubation, cardiac arrest and death.  Prognosis is guarded.  Best practice (right click and "Reselect all SmartList Selections" daily)  Diet:  Oral Pain/Anxiety/Delirium protocol (if indicated): No VAP protocol (if indicated): Not indicated DVT prophylaxis: LMWH GI prophylaxis: N/A Glucose control:  SSI No Central venous access:  N/A Arterial line:  N/A Foley:  N/A Mobility:  bed rest  PT consulted: N/A Last date of multidisciplinary goals of care discussion [N/A] Code  Status:  full code Disposition: Stepdown  Labs   CBC: Recent Labs  Lab 10/23/2020 2047 11/07/20 0653 11/08/20 0313 11/09/20 0416 11/10/20 0353 11/11/20 0650  WBC 9.2 8.9 12.5* 6.0 8.6 6.9  NEUTROABS 7.2  --   --  5.1  --   --   HGB 13.0 12.0* 12.5* 10.9* 11.6* 12.7*  HCT 38.8* 35.1* 38.8* 33.8* 35.2* 37.6*  MCV 82.9 82.8 86.0 85.8 83.8 82.8  PLT 324 268 303 242 290 063    Basic Metabolic Panel: Recent Labs  Lab 11/08/20 0313 11/09/20 0416 11/10/20 0353 11/11/20 0650 11/12/20 0537  NA 130* 137 135 133* 129*  K 3.7 4.7 3.8 3.7 4.3  CL 97* 107 101 98 99  CO2 '24 24 24 26 22  ' GLUCOSE 109* 170* 102* 88 135*  BUN 15 24* 25* 22* 18  CREATININE 0.99 0.81 0.76 0.73 0.65  CALCIUM 8.0* 8.8* 8.5* 8.6* 8.4*  MG 2.1 2.7* 2.5* 2.2  --   PHOS 5.9* 4.9* 4.0 3.7  --    GFR: Estimated Creatinine Clearance: 113.6 mL/min (by C-G formula based on SCr of 0.65 mg/dL). Recent Labs  Lab 10/29/2020 2047 10/26/2020 2357 11/07/20 0653 11/08/20 0313 11/09/20 0416 11/10/20 0353 11/11/20 0650  PROCALCITON  --  3.07 2.89 4.56  --   --   --   WBC 9.2  --  8.9 12.5* 6.0 8.6 6.9  LATICACIDVEN 1.9  --  1.4 1.7  --   --   --  Liver Function Tests: Recent Labs  Lab 10/18/2020 2047 11/09/20 0416 11/10/20 0353  AST 67* 33  --   ALT 54* 43  --   ALKPHOS 129* 98  --   BILITOT 0.7 0.7  --   PROT 8.4* 6.8  --   ALBUMIN 2.7* 2.0* 2.0*   No results for input(s): LIPASE, AMYLASE in the last 168 hours. No results for input(s): AMMONIA in the last 168 hours.  ABG    Component Value Date/Time   PHART 7.30 (L) 11/08/2020 0500   PCO2ART 48 11/08/2020 0500   PO2ART 239 (H) 11/08/2020 0500   HCO3 23.6 11/08/2020 0500   ACIDBASEDEF 3.2 (H) 11/08/2020 0500   O2SAT 99.8 11/08/2020 0500     Coagulation Profile: Recent Labs  Lab 10/21/2020 2047 11/07/20 0653 11/08/20 0313  INR 1.1 1.2 1.3*    Cardiac Enzymes: No results for input(s): CKTOTAL, CKMB, CKMBINDEX, TROPONINI in the last 168  hours.  HbA1C: No results found for: HGBA1C  CBG: Recent Labs  Lab 11/11/20 2040 11/11/20 2322 11/12/20 0221 11/12/20 0412 11/12/20 0750  GLUCAP 177* 228* 117* 106* 107*    Review of Systems:   Unable to obtain due to sedation on MV    Past Medical History:  He,  has no past medical history on file.   Surgical History:  History reviewed. No pertinent surgical history.   Social History:   reports that he has never smoked. He has never used smokeless tobacco. He reports current alcohol use of about 1.0 standard drink of alcohol per week.   Family History:  His family history is not on file.   Allergies No Known Allergies   Home Medications  Prior to Admission medications   Medication Sig Start Date End Date Taking? Authorizing Provider  acetaminophen (TYLENOL) 325 MG tablet Take 650 mg by mouth every 6 (six) hours as needed.   Yes [provider]  albuterol (VENTOLIN HFA) 108 (90 Base) MCG/ACT inhaler Inhale 2 puffs into the lungs every 4 (four) hours as needed. 10/31/20  Yes [provider]  fluticasone-salmeterol (ADVAIR) 100-50 MCG/ACT AEPB Inhale 1 puff into the lungs 2 (two) times daily. 11/03/20  Yes [provider]  Vitamin D, Ergocalciferol, (DRISDOL) 1.25 MG (50000 UNIT) CAPS capsule Take 1 capsule by mouth once a week. 10/31/20  Yes [provider]          Ottie Glazier, M.D.  [facility] [place]

## 2020-11-12 DEATH — deceased

## 2020-11-13 DIAGNOSIS — Z21 Asymptomatic human immunodeficiency virus [HIV] infection status: Secondary | ICD-10-CM

## 2020-11-13 LAB — GLUCOSE, CAPILLARY
Glucose-Capillary: 109 mg/dL — ABNORMAL HIGH (ref 70–99)
Glucose-Capillary: 111 mg/dL — ABNORMAL HIGH (ref 70–99)
Glucose-Capillary: 119 mg/dL — ABNORMAL HIGH (ref 70–99)
Glucose-Capillary: 174 mg/dL — ABNORMAL HIGH (ref 70–99)
Glucose-Capillary: 175 mg/dL — ABNORMAL HIGH (ref 70–99)
Glucose-Capillary: 256 mg/dL — ABNORMAL HIGH (ref 70–99)

## 2020-11-13 LAB — CBC
HCT: 38.2 % — ABNORMAL LOW (ref 39.0–52.0)
Hemoglobin: 12.7 g/dL — ABNORMAL LOW (ref 13.0–17.0)
MCH: 27.1 pg (ref 26.0–34.0)
MCHC: 33.2 g/dL (ref 30.0–36.0)
MCV: 81.4 fL (ref 80.0–100.0)
Platelets: 376 10*3/uL (ref 150–400)
RBC: 4.69 MIL/uL (ref 4.22–5.81)
RDW: 13.2 % (ref 11.5–15.5)
WBC: 11.6 10*3/uL — ABNORMAL HIGH (ref 4.0–10.5)
nRBC: 0 % (ref 0.0–0.2)

## 2020-11-13 LAB — QUANTIFERON-TB GOLD PLUS (RQFGPL)
QuantiFERON Mitogen Value: 0.29 IU/mL
QuantiFERON Nil Value: 0.1 IU/mL
QuantiFERON TB1 Ag Value: 0.1 IU/mL
QuantiFERON TB2 Ag Value: 0.11 IU/mL

## 2020-11-13 LAB — BASIC METABOLIC PANEL
Anion gap: 8 (ref 5–15)
BUN: 19 mg/dL (ref 6–20)
CO2: 23 mmol/L (ref 22–32)
Calcium: 8.6 mg/dL — ABNORMAL LOW (ref 8.9–10.3)
Chloride: 94 mmol/L — ABNORMAL LOW (ref 98–111)
Creatinine, Ser: 0.94 mg/dL (ref 0.61–1.24)
GFR, Estimated: >60 mL/min (ref >60)
Glucose, Bld: 181 mg/dL — ABNORMAL HIGH (ref 70–99)
Potassium: 4.3 mmol/L (ref 3.5–5.1)
Sodium: 125 mmol/L — ABNORMAL LOW (ref 135–145)

## 2020-11-13 LAB — CRYPTOCOCCUS ANTIGEN, SERUM: Cryptococcus Antigen, Serum: NEGATIVE

## 2020-11-13 LAB — HEMOGLOBIN A1C
Hgb A1c MFr Bld: 5.9 % — ABNORMAL HIGH (ref 4.8–5.6)
Mean Plasma Glucose: 123 mg/dL

## 2020-11-13 LAB — T.PALLIDUM AB, TOTAL: T Pallidum Abs: REACTIVE — AB

## 2020-11-13 LAB — QUANTIFERON-TB GOLD PLUS: QuantiFERON-TB Gold Plus: UNDETERMINED — AB

## 2020-11-13 LAB — TOXOPLASMA ANTIBODIES- IGG AND  IGM
Toxoplasma Antibody- IgM: 9.3 AU/mL — ABNORMAL HIGH (ref 0.0–7.9)
Toxoplasma IgG Ratio: 286 IU/mL — ABNORMAL HIGH (ref 0.0–7.1)

## 2020-11-13 LAB — HIV-1/HIV-2 QUAL RNA
HIV-1 RNA, Qualitative: REACTIVE — AB
HIV-2 RNA, Qualitative: NONREACTIVE

## 2020-11-13 LAB — HIV-1 RNA QUANT-NO REFLEX-BLD
HIV 1 RNA Quant: 1530000 copies/mL
LOG10 HIV-1 RNA: 6.185 log10copy/mL

## 2020-11-13 MED ORDER — FUROSEMIDE 10 MG/ML IJ SOLN
40.0000 mg | Freq: Two times a day (BID) | INTRAMUSCULAR | Status: DC
  Administered 2020-11-13 – 2020-11-15 (×4): 40 mg via INTRAVENOUS
  Filled 2020-11-13 (×4): qty 4

## 2020-11-13 NOTE — Progress Notes (Signed)
PROGRESS NOTE    Jordan Gibson [place]  ZOX:[id number] DOB: 1980/12/19 DOA: 11/07/2020 PCP: Merryl Hacker, No  Outpatient Specialists: none    Brief Narrative:   Devin Ganaway is a 40 y.o. Male with no significant past medical history who presented to [facility] ED on 11/07/20 with a 1 week history of shortness of breath, nonproductive cough, and fever. He denied chest pain, abdominal pain, Nausea, vomiting, dysuria, lower extremity pain or swelling.  Denies recent travel or sick contacts.  ED Course:  Upon arrival to ED vitals included: fever with Temperature 102, pulse 132, RR 24, O2 sats 52% on room air.  He was placed on HFNC with improvement of O2 sats to the mid 90's.  Workup significant for WBC 9.2, procalcitonin 3.07, lactic acid 1.9, BNP 44, HS Troponin 65, sodium 126.  Urinalysis is unremarkable.  COVID-19 and Influenza PCR's are both negative.  Chest x-ray with symmetric bilateral pulmonary airspace disease concerning for atypical infection vs. Edema vs hemorrhage.  He met sepsis criteria and was started on IV Rocephin and  Azithromycin, along with fluid bolus.  He is to be admitted to [facility] unit by Hospitalist.  While in ED, he continues to require high amounts of supplemental O2 and continues to exhibit moderate respiratory distress.  He is at high risk for respiratory deterioration and need for intubation.   11/07/2020: Presented to ED, to be admitted to stepdown unit by hospitalist  11/07/2020: Increasing FiO2 requirements and worsening respiratory status, PCCM consulted due to high risk for deterioration and need for intubation. ABX broadened to Cefepime and Vancomycin, along with Azithromycin  11/08/20- patient with respiratory failure s/p ETT overnight. I met with family at bedside today and reviewed care plan.  Vancomycin dcd today MRSA pcr negative.   -11/09/20- working on weaning ventilator and liberating from MV post SBT.  Patient with severe ARDS. I met with family and reviewed  care plan. +Rhino/ENtero virus  11/10/20- patient improved, has been extubated to HFNC.  Tranferring to step down unit, signed out to [facility].  11/11/2020 -HIV positive.  ID consulted and further work-up in progress.  Started on IV Bactrim and prednisone by ID  11/12/2020 -FiO2 increased to 75%.  PCCM added Lasix and cut back on fluids.  11/13/2020-oxygen saturations in low 90s on HFNC 85% 50 L and NRB requiring transfer back to stepdown.    Assessment & Plan:   Active Problems:   CAP (community acquired pneumonia)   Sepsis ([place])   Acute respiratory failure with hypoxia ([place])   Hyponatremia   Transaminitis   HIV positive ([place])   # Acute hypoxic respiratory failure due to severe ARDS from community-acquired pneumonia - treated with IV antibiotic.  Stopped antibiotics per ID - oxygen saturations in low 90s despite on HFNC 85% 50 L and NRB -we will transfer him back to ICU/stepdown. 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  COVID-19 and influenza PCR are negative. -Continue IV Bactrim and prednisone per ID.  Prednisone will need to taper over 21 days Start HAART in in about another week per ID after being on PCP treatment Further work-up for opportunistic infection in progress per ID/PCCM -If his Fungitell is normal and he may need bronchoscopy  # Etoh abuse Pt reports no alcohol for 1 month. No signs withdrawal - monitor  # Hyponatremia Likely siadh from pulmonary process.  - monitor, sodium  125 (129) today   DVT prophylaxis: lovenox Code Status: full Family Communication: sister updated @ bedside 6/1  Level of care: Stepdown Status is: Inpatient  Remains inpatient appropriate because:Inpatient level of care appropriate due to severity of illness   Dispo: The patient is from: Home              Anticipated d/c is to: Home.              Patient currently is not  medically stable to d/c.   Difficult to place patient No    Consultants:  ID PCCM   Subjective: Complaining of cough.  Spanish interpreter via computer at bedside  Objective: Vitals:   11/13/20 1433 11/13/20 1452 11/13/20 1500 11/13/20 1600  BP:  134/88 115/78 112/78  Pulse: (!) 117 (!) 116 (!) 112 (!) 102  Resp:  (!) 32 (!) 23 (!) 23  Temp:  99.4 F (37.4 C)    TempSrc:  Axillary    SpO2: 93% 97% 98% 98%  Weight:      Height:        Intake/Output Summary (Last 24 hours) at 11/13/2020 1705 Last data filed at 11/13/2020 1500 Gross per 24 hour  Intake 2495.95 ml  Output 2600 ml  Net -104.05 ml   Filed Weights   11/08/2020 2040 11/07/20 2000 11/11/20 0536  Weight: 77.1 kg 80.1 kg 74.9 kg    Examination:  General exam: Appears calm and comfortable  Respiratory system: scattered rales Cardiovascular system: S1 & S2 heard, RRR. No JVD, murmurs, rubs, gallops or clicks. No pedal edema. Gastrointestinal system: Abdomen is nondistended, soft and nontender. No organomegaly or masses felt. Normal bowel sounds heard. Central nervous system: Alert and oriented. No focal neurological deficits. Extremities: Symmetric 5 x 5 power. Skin: No rashes, lesions or ulcers Psychiatry: Judgement and insight appear normal. Mood & affect appropriate.     Data Reviewed: I have personally reviewed following labs and imaging studies  CBC: Recent Labs  Lab 11/07/2020 2047 11/07/20 0653 11/09/20 0416 11/10/20 0353 11/11/20 0650 11/13/20 0527  WBC 9.2   < > 6.0 8.6 7.0  6.9 11.6*  NEUTROABS 7.2  --  5.1  --  6.0  --   HGB 13.0   < > 10.9* 11.6* 12.7*  12.5* 12.7*  HCT 38.8*   < > 33.8* 35.2* 37.6*  39.8 38.2*  MCV 82.9   < > 85.8 83.8 82.8  86 81.4  PLT 324   < > 242 290 307  293 376   < > = values in this interval not displayed.   Basic Metabolic Panel: Recent Labs  Lab 11/08/20 0313 11/09/20 0416 11/10/20 0353 11/11/20 0650 11/12/20 0537 11/13/20 0527  NA 130* 137 135  133* 129* 125*  K 3.7 4.7 3.8 3.7 4.3 4.3  CL 97* 107 101 98 99 94*  CO2 _0 GLUCOSE 109* 170* 102* 88 135* 181*  BUN 15 24* 25* 22* 18 19  CREATININE 0.99 0.81 0.76 0.73 0.65 0.94  CALCIUM 8.0* 8.8* 8.5* 8.6* 8.4* 8.6*  MG 2.1 2.7* 2.5* 2.2  --   --   PHOS 5.9* 4.9* 4.0 3.7  --   --    GFR: Estimated Creatinine Clearance: 96.7 mL/min (by C-G formula based on SCr of 0.94 mg/dL). Liver Function Tests: Recent Labs  Lab 10/27/2020 2047 11/09/20 0416 11/10/20 0353  AST 67* 33  --   ALT 54* 43  --   ALKPHOS  129* 98  --   BILITOT 0.7 0.7  --   PROT 8.4* 6.8  --   ALBUMIN 2.7* 2.0* 2.0*   No results for input(s): LIPASE, AMYLASE in the last 168 hours. No results for input(s): AMMONIA in the last 168 hours. Coagulation Profile: Recent Labs  Lab 10/19/2020 2047 11/07/20 0653 11/08/20 0313  INR 1.1 1.2 1.3*   Cardiac Enzymes: No results for input(s): CKTOTAL, CKMB, CKMBINDEX, TROPONINI in the last 168 hours. BNP (last 3 results) No results for input(s): PROBNP in the last 8760 hours. HbA1C: Recent Labs    11/12/20 0537  HGBA1C 5.9*   CBG: Recent Labs  Lab 11/13/20 0049 11/13/20 0452 11/13/20 0931 11/13/20 1207 11/13/20 1452  GLUCAP 119* 175* 111* 109* 256*   Lipid Profile: No results for input(s): CHOL, HDL, LDLCALC, TRIG, CHOLHDL, LDLDIRECT in the last 72 hours. Thyroid Function Tests: No results for input(s): TSH, T4TOTAL, FREET4, T3FREE, THYROIDAB in the last 72 hours. Anemia Panel: No results for input(s): VITAMINB12, FOLATE, FERRITIN, TIBC, IRON, RETICCTPCT in the last 72 hours. Urine analysis:    Component Value Date/Time   COLORURINE YELLOW (A) 10/26/2020 2053   APPEARANCEUR HAZY (A) 10/18/2020 2053   LABSPEC 1.014 10/18/2020 2053   PHURINE 6.0 11/10/2020 2053   GLUCOSEU NEGATIVE 10/16/2020 2053   HGBUR SMALL (A) 10/26/2020 2053   BILIRUBINUR NEGATIVE 11/05/2020 2053   [place] NEGATIVE 10/13/2020 2053   PROTEINUR 30 (A) 10/21/2020  2053   NITRITE NEGATIVE 11/07/2020 2053   LEUKOCYTESUR NEGATIVE 10/13/2020 2053   Sepsis Labs: _0 (procalcitonin:4,lacticidven:4)  ) Recent Results (from the past 240 hour(s))  Resp Panel by RT-PCR (Flu A&B, Covid) Nasopharyngeal Swab     Status: None   Collection Time: 10/20/2020  8:47 PM   Specimen: Nasopharyngeal Swab; Nasopharyngeal(NP) swabs in vial transport medium  Result Value Ref Range Status   SARS Coronavirus 2 by RT PCR NEGATIVE NEGATIVE Final    Comment: (NOTE) SARS-CoV-2 target nucleic acids are NOT DETECTED.  The SARS-CoV-2 RNA is generally detectable in upper respiratory specimens during the acute phase of infection. The lowest concentration of SARS-CoV-2 viral copies this assay can detect is 138 copies/mL. A negative result does not preclude SARS-Cov-2 infection and should not be used as the sole basis for treatment or other patient management decisions. A negative result may occur with  improper specimen collection/handling, submission of specimen other than nasopharyngeal swab, presence of viral mutation(s) within the areas targeted by this assay, and inadequate number of viral copies(<138 copies/mL). A negative result must be combined with clinical observations, patient history, and epidemiological information. The expected result is Negative.  Fact Sheet for Patients:  EntrepreneurPulse.com.au  Fact Sheet for Healthcare Providers:  IncredibleEmployment.be  This test is no t yet approved or cleared by the [place] FDA and  has been authorized for detection and/or diagnosis of SARS-CoV-2 by FDA under an Emergency Use Authorization (EUA). This EUA will remain  in effect (meaning this test can be used) for the duration of the COVID-19 declaration under Section 564(b)(1) of the Act, 21 U.S.C.section 360bbb-3(b)(1), unless the authorization is terminated  or revoked sooner.       Influenza A by PCR NEGATIVE  NEGATIVE Final   Influenza B by PCR NEGATIVE NEGATIVE Final    Comment: (NOTE) The Xpert Xpress SARS-CoV-2/FLU/RSV plus assay is intended as an aid in the diagnosis of influenza from Nasopharyngeal swab specimens and should not be used as a sole basis for treatment. Nasal washings and aspirates are  unacceptable for Xpert Xpress SARS-CoV-2/FLU/RSV testing.  Fact Sheet for Patients: EntrepreneurPulse.com.au  Fact Sheet for Healthcare Providers: IncredibleEmployment.be  This test is not yet approved or cleared by the [place] FDA and has been authorized for detection and/or diagnosis of SARS-CoV-2 by FDA under an Emergency Use Authorization (EUA). This EUA will remain in effect (meaning this test can be used) for the duration of the COVID-19 declaration under Section 564(b)(1) of the Act, 21 U.S.C. section 360bbb-3(b)(1), unless the authorization is terminated or revoked.  Performed at [facility], [address] [place], [place] [address]   Culture, blood (Routine x 2)     Status: None   Collection Time: 10/24/2020  8:53 PM   Specimen: BLOOD  Result Value Ref Range Status   Specimen Description BLOOD RIGHT ANTECUBITAL  Final   Special Requests   Final    BOTTLES DRAWN AEROBIC AND ANAEROBIC Blood Culture adequate volume   Culture   Final    NO GROWTH 5 DAYS Performed at [facility], [address] [place], [place] [address]    Report Status 11/11/2020 FINAL  Final  Urine culture     Status: None   Collection Time: 10/18/2020  8:53 PM   Specimen: In/Out Cath Urine  Result Value Ref Range Status   Specimen Description   Final    IN/OUT CATH URINE Performed at [facility], [address] [place], [place] [address]    Special Requests   Final    NONE Performed at [facility], [address] [place], [place] [address]    Culture   Final    NO GROWTH Performed at [facility],  [place] [address] [place], [place] [address]    Report Status 11/08/2020 FINAL  Final  Culture, blood (Routine x 2)     Status: None   Collection Time: 10/30/2020  8:58 PM   Specimen: BLOOD  Result Value Ref Range Status   Specimen Description BLOOD LEFT ANTECUBITAL  Final   Special Requests   Final    BOTTLES DRAWN AEROBIC AND ANAEROBIC Blood Culture adequate volume   Culture   Final    NO GROWTH 5 DAYS Performed at [facility], [address] [place], [place] [address]    Report Status 11/11/2020 FINAL  Final  MRSA PCR Screening     Status: None   Collection Time: 11/07/20 11:15 AM   Specimen: Nasal Mucosa; Nasopharyngeal  Result Value Ref Range Status   MRSA by PCR NEGATIVE NEGATIVE Final    Comment:        The GeneXpert MRSA Assay (FDA approved for NASAL specimens only), is one component of a comprehensive MRSA colonization surveillance program. It is not intended to diagnose MRSA infection nor to guide or monitor treatment for MRSA infections. Performed at [facility], [place], [place] [address]   Expectorated Sputum Assessment w Gram Stain, Rflx to Resp Cult     Status: None   Collection Time: 11/07/20 11:55 AM   Specimen: Expectorated Sputum  Result Value Ref Range Status   Specimen Description EXPECTORATED SPUTUM  Final   Special Requests NONE  Final   Sputum evaluation   Final    Sputum specimen not acceptable for testing.  Please recollect.   C/JOHN [facility] AT 1227 11/07/20.PMF Performed at [facility], [address] [place], [place] [address]    Report Status 11/07/2020 FINAL  Final  Resp Panel by RT-PCR (Flu A&B,  Covid) Nasopharyngeal Swab     Status: None   Collection Time: 11/08/20  3:08 AM   Specimen: Nasopharyngeal Swab; Nasopharyngeal(NP) swabs in vial transport medium  Result Value Ref Range Status   SARS Coronavirus 2 by RT PCR NEGATIVE NEGATIVE Final    Comment: (NOTE) SARS-CoV-2 target nucleic acids are  NOT DETECTED.  The SARS-CoV-2 RNA is generally detectable in upper respiratory specimens during the acute phase of infection. The lowest concentration of SARS-CoV-2 viral copies this assay can detect is 138 copies/mL. A negative result does not preclude SARS-Cov-2 infection and should not be used as the sole basis for treatment or other patient management decisions. A negative result may occur with  improper specimen collection/handling, submission of specimen other than nasopharyngeal swab, presence of viral mutation(s) within the areas targeted by this assay, and inadequate number of viral copies(<138 copies/mL). A negative result must be combined with clinical observations, patient history, and epidemiological information. The expected result is Negative.  Fact Sheet for Patients:  EntrepreneurPulse.com.au  Fact Sheet for Healthcare Providers:  IncredibleEmployment.be  This test is no t yet approved or cleared by the [place] FDA and  has been authorized for detection and/or diagnosis of SARS-CoV-2 by FDA under an Emergency Use Authorization (EUA). This EUA will remain  in effect (meaning this test can be used) for the duration of the COVID-19 declaration under Section 564(b)(1) of the Act, 21 U.S.C.section 360bbb-3(b)(1), unless the authorization is terminated  or revoked sooner.       Influenza A by PCR NEGATIVE NEGATIVE Final   Influenza B by PCR NEGATIVE NEGATIVE Final    Comment: (NOTE) The Xpert Xpress SARS-CoV-2/FLU/RSV plus assay is intended as an aid in the diagnosis of influenza from Nasopharyngeal swab specimens and should not be used as a sole basis for treatment. Nasal washings and aspirates are unacceptable for Xpert Xpress SARS-CoV-2/FLU/RSV testing.  Fact Sheet for Patients: EntrepreneurPulse.com.au  Fact Sheet for Healthcare Providers: IncredibleEmployment.be  This test is not  yet approved or cleared by the [place] FDA and has been authorized for detection and/or diagnosis of SARS-CoV-2 by FDA under an Emergency Use Authorization (EUA). This EUA will remain in effect (meaning this test can be used) for the duration of the COVID-19 declaration under Section 564(b)(1) of the Act, 21 U.S.C. section 360bbb-3(b)(1), unless the authorization is terminated or revoked.  Performed at [facility], [address] [place], [place] [address]   Culture, Respiratory w Gram Stain     Status: None   Collection Time: 11/08/20  4:37 AM   Specimen: Tracheal Aspirate; Respiratory  Result Value Ref Range Status   Specimen Description   Final    TRACHEAL ASPIRATE Performed at [facility], [address], [place], [place] [address]    Special Requests   Final    NONE Performed at [facility], [address] [place], [place] [address]    Gram Stain   Final    MODERATE WBC PRESENT,BOTH PMN AND MONONUCLEAR FEW GRAM POSITIVE COCCI IN CLUSTERS RARE GRAM POSITIVE RODS    Culture   Final    Normal respiratory flora-no Staph aureus or Pseudomonas seen Performed at [facility], [place] [address] [place], [place] [address]    Report Status 11/10/2020 FINAL  Final  Respiratory (~20 pathogens) panel by PCR     Status: Abnormal   Collection Time: 11/08/20 12:50 PM   Specimen: Nasopharyngeal Swab; Respiratory  Result Value Ref Range Status  Adenovirus NOT DETECTED NOT DETECTED Final   Coronavirus 229E NOT DETECTED NOT DETECTED Final    Comment: (NOTE) The Coronavirus on the Respiratory Panel, DOES NOT test for the novel  Coronavirus (2019 nCoV)    Coronavirus HKU1 NOT DETECTED NOT DETECTED Final   Coronavirus NL63 NOT DETECTED NOT DETECTED Final   Coronavirus OC43 NOT DETECTED NOT DETECTED Final   Metapneumovirus NOT DETECTED NOT DETECTED Final   Rhinovirus / Enterovirus DETECTED (A) NOT DETECTED Final   Influenza A NOT DETECTED NOT  DETECTED Final   Influenza B NOT DETECTED NOT DETECTED Final   Parainfluenza Virus 1 NOT DETECTED NOT DETECTED Final   Parainfluenza Virus 2 NOT DETECTED NOT DETECTED Final   Parainfluenza Virus 3 NOT DETECTED NOT DETECTED Final   Parainfluenza Virus 4 NOT DETECTED NOT DETECTED Final   Respiratory Syncytial Virus NOT DETECTED NOT DETECTED Final   Bordetella pertussis NOT DETECTED NOT DETECTED Final   Bordetella Parapertussis NOT DETECTED NOT DETECTED Final   Chlamydophila pneumoniae NOT DETECTED NOT DETECTED Final   Mycoplasma pneumoniae NOT DETECTED NOT DETECTED Final    Comment: Performed at [facility], [place] [address] [place], [place] [address]  Culture, Respiratory w Gram Stain     Status: None   Collection Time: 11/08/20 12:55 PM   Specimen: Tracheal Aspirate; Respiratory  Result Value Ref Range Status   Specimen Description   Final    TRACHEAL ASPIRATE Performed at [facility], [address] [place], [place] [address]    Special Requests   Final    NONE Performed at [facility], [address] [place], [place] [address]    Gram Stain   Final    FEW WBC PRESENT,BOTH PMN AND MONONUCLEAR NO ORGANISMS SEEN    Culture   Final    Normal respiratory flora-no Staph aureus or Pseudomonas seen Performed at [place] [address], [place], [place] [address]    Report Status 11/12/2020 FINAL  Final         Radiology Studies: DG Chest 2 View  Result Date: 11/12/2020 CLINICAL DATA:  Shortness of breath EXAM: CHEST - 2 VIEW COMPARISON:  11/09/2020 FINDINGS: Endotracheal tube and orogastric tube have been removed. Lung volumes are diminished following extubation. Widespread diffuse pulmonary density persists which could be due to a combination of pneumonia and edema. No visible effusion. IMPRESSION: Removal of endotracheal tube and orogastric tube. Lung volumes diminished following extubation. Widespread pulmonary density persists which  could be due to a combination of pneumonia and edema. Electronically Signed   By: [place] Chimes M.D.   On: 11/12/2020 14:27        Scheduled Meds: . chlorhexidine  15 mL Mouth Rinse BID  . Chlorhexidine Gluconate Cloth  6 each Topical Daily  . enoxaparin (LOVENOX) injection  40 mg Subcutaneous Q24H  . furosemide  20 mg Intravenous BID  . insulin aspart  0-9 Units Subcutaneous Q4H  . mouth rinse  15 mL Mouth Rinse q12n4p  . predniSONE  40 mg Oral BID WC   Continuous Infusions: . sodium chloride 10 mL/hr at 11/10/20 0500  . sulfamethoxazole-trimethoprim 375 mg of trimethoprim (11/13/20 1252)     LOS: 7 days    Time spent: 35 min    Dyana Magner Manuella Ghazi, MD [facility]   If 7PM-7AM, please contact night-coverage [email or web address] Password [facility] 11/13/2020, 5:05 PM

## 2020-11-13 NOTE — Progress Notes (Signed)
Called lab per MD request regarding tracheal aspirate tube. Lab advised that there is no tube in the lab for this MRN.

## 2020-11-13 NOTE — Progress Notes (Signed)
   Date of Admission:  11-19-2020        Subjective: Back in ICU due to increasing oxygen needs He says he is the same appetitie fair On hiflo  Medications:  . chlorhexidine  15 mL Mouth Rinse BID  . Chlorhexidine Gluconate Cloth  6 each Topical Daily  . enoxaparin (LOVENOX) injection  40 mg Subcutaneous Q24H  . furosemide  40 mg Intravenous BID  . insulin aspart  0-9 Units Subcutaneous Q4H  . mouth rinse  15 mL Mouth Rinse q12n4p  . predniSONE  40 mg Oral BID WC    Objective: Vital signs in last 24 hours: Temp:  [98.1 F (36.7 C)-99.6 F (37.6 C)] 98.2 F (36.8 C) (06/02 1926) Pulse Rate:  [90-117] 107 (06/02 2000) Resp:  [17-32] 29 (06/02 2000) BP: (112-134)/(71-88) 117/82 (06/02 2000) SpO2:  [84 %-98 %] 94 % (06/02 2000) FiO2 (%):  [87 %-97 %] 95 % (06/02 1944)  PHYSICAL EXAM:  General: Alert, cooperative, resp distress, appears stated age.   Lungs: b/l crepts Heart: Tachycardia Abdomen: Soft, non-tender,not distended. Bowel sounds normal. No masses Extremities: atraumatic, no cyanosis. No edema. No clubbing Skin: No rashes or lesions. Or bruising Lymph: Cervical, supraclavicular normal. Neurologic: Grossly non-focal  Lab Results Recent Labs    11/11/20 0650 11/12/20 0537 11/13/20 0527  WBC 7.0  6.9  --  11.6*  HGB 12.7*  12.5*  --  12.7*  HCT 37.6*  39.8  --  38.2*  NA 133* 129* 125*  K 3.7 4.3 4.3  CL 98 99 94*  CO2 26 22 23   BUN 22* 18 19  CREATININE 0.73 0.65 0.94   Liver Panel No results for input(s): PROT, ALBUMIN, AST, ALT, ALKPHOS, BILITOT, BILIDIR, IBILI in the last 72 hours. Sedimentation Rate No results for input(s): ESRSEDRATE in the last 72 hours. C-Reactive Protein No results for input(s): CRP in the last 72 hours.  Microbiology:  Studies/Results: DG Chest 2 View  Result Date: 11/12/2020 CLINICAL DATA:  Shortness of breath EXAM: CHEST - 2 VIEW COMPARISON:  11/09/2020 FINDINGS: Endotracheal tube and orogastric tube have been  removed. Lung volumes are diminished following extubation. Widespread diffuse pulmonary density persists which could be due to a combination of pneumonia and edema. No visible effusion. IMPRESSION: Removal of endotracheal tube and orogastric tube. Lung volumes diminished following extubation. Widespread pulmonary density persists which could be due to a combination of pneumonia and edema. Electronically Signed   By: 11/11/2020 M.D.   On: 11/12/2020 14:27     Assessment/Plan:  Acute hypoxic respiratory failure due to b/l pulmonary infiltrate which is likely PJP On IV bactrim + PO prednisone. Watch cr  Beta D glucan has been sent and is pending- if normal then will need bronch for diagnosis  Aids newly diagnosed- will start HAART within 2 weeks once resp status is more settled  High toxo titres- clinically no indication of CNS infection bactrim IV will treat  Hyponatremia  RPR 1 :4- await TPA - if positive will treat with Benzathine penicillin Discussed the management with care team

## 2020-11-13 NOTE — Progress Notes (Signed)
Patient sister updated on room change by RN per patient request.

## 2020-11-14 ENCOUNTER — Inpatient Hospital Stay: Payer: Medicaid Other

## 2020-11-14 DIAGNOSIS — J9600 Acute respiratory failure, unspecified whether with hypoxia or hypercapnia: Secondary | ICD-10-CM

## 2020-11-14 DIAGNOSIS — R0902 Hypoxemia: Secondary | ICD-10-CM

## 2020-11-14 DIAGNOSIS — Z01818 Encounter for other preprocedural examination: Secondary | ICD-10-CM

## 2020-11-14 DIAGNOSIS — A419 Sepsis, unspecified organism: Secondary | ICD-10-CM

## 2020-11-14 DIAGNOSIS — E871 Hypo-osmolality and hyponatremia: Secondary | ICD-10-CM

## 2020-11-14 LAB — BLOOD GAS, ARTERIAL
Acid-base deficit: 4.9 mmol/L — ABNORMAL HIGH (ref 0.0–2.0)
Bicarbonate: 24.4 mmol/L (ref 20.0–28.0)
FIO2: 1
MECHVT: 450 mL
O2 Saturation: 99.3 %
PEEP: 10 cmH2O
Patient temperature: 37
RATE: 16 resp/min
pCO2 arterial: 61 mmHg — ABNORMAL HIGH (ref 32.0–48.0)
pH, Arterial: 7.21 — ABNORMAL LOW (ref 7.350–7.450)
pO2, Arterial: 171 mmHg — ABNORMAL HIGH (ref 83.0–108.0)

## 2020-11-14 LAB — GLUCOSE, CAPILLARY
Glucose-Capillary: 114 mg/dL — ABNORMAL HIGH (ref 70–99)
Glucose-Capillary: 117 mg/dL — ABNORMAL HIGH (ref 70–99)
Glucose-Capillary: 145 mg/dL — ABNORMAL HIGH (ref 70–99)
Glucose-Capillary: 160 mg/dL — ABNORMAL HIGH (ref 70–99)
Glucose-Capillary: 168 mg/dL — ABNORMAL HIGH (ref 70–99)
Glucose-Capillary: 174 mg/dL — ABNORMAL HIGH (ref 70–99)
Glucose-Capillary: 276 mg/dL — ABNORMAL HIGH (ref 70–99)

## 2020-11-14 LAB — MAGNESIUM: Magnesium: 2.7 mg/dL — ABNORMAL HIGH (ref 1.7–2.4)

## 2020-11-14 LAB — BASIC METABOLIC PANEL
Anion gap: 13 (ref 5–15)
BUN: 19 mg/dL (ref 6–20)
CO2: 17 mmol/L — ABNORMAL LOW (ref 22–32)
Calcium: 9 mg/dL (ref 8.9–10.3)
Chloride: 95 mmol/L — ABNORMAL LOW (ref 98–111)
Creatinine, Ser: 0.88 mg/dL (ref 0.61–1.24)
GFR, Estimated: >60 mL/min (ref >60)
Glucose, Bld: 142 mg/dL — ABNORMAL HIGH (ref 70–99)
Potassium: 4.3 mmol/L (ref 3.5–5.1)
Sodium: 125 mmol/L — ABNORMAL LOW (ref 135–145)

## 2020-11-14 LAB — PHOSPHORUS: Phosphorus: 7.6 mg/dL — ABNORMAL HIGH (ref 2.5–4.6)

## 2020-11-14 LAB — CBC
HCT: 41.4 % (ref 39.0–52.0)
Hemoglobin: 14.2 g/dL (ref 13.0–17.0)
MCH: 27.8 pg (ref 26.0–34.0)
MCHC: 34.3 g/dL (ref 30.0–36.0)
MCV: 81 fL (ref 80.0–100.0)
Platelets: 436 10*3/uL — ABNORMAL HIGH (ref 150–400)
RBC: 5.11 MIL/uL (ref 4.22–5.81)
RDW: 13.2 % (ref 11.5–15.5)
WBC: 11.2 10*3/uL — ABNORMAL HIGH (ref 4.0–10.5)
nRBC: 0 % (ref 0.0–0.2)

## 2020-11-14 MED ORDER — FREE WATER
30.0000 mL | Status: DC
  Administered 2020-11-14 – 2020-11-15 (×8): 30 mL

## 2020-11-14 MED ORDER — FENTANYL CITRATE (PF) 100 MCG/2ML IJ SOLN: 100.0000 ug | Freq: Once | INTRAMUSCULAR | Status: AC

## 2020-11-14 MED ORDER — INSULIN ASPART 100 UNIT/ML IJ SOLN
0.0000 [IU] | INTRAMUSCULAR | Status: DC
  Administered 2020-11-14 – 2020-11-15 (×2): 3 [IU] via SUBCUTANEOUS
  Administered 2020-11-15: 2 [IU] via SUBCUTANEOUS
  Administered 2020-11-15: 8 [IU] via SUBCUTANEOUS
  Administered 2020-11-15: 3 [IU] via SUBCUTANEOUS
  Administered 2020-11-15: 5 [IU] via SUBCUTANEOUS
  Administered 2020-11-15: 3 [IU] via SUBCUTANEOUS
  Filled 2020-11-14 (×6): qty 1

## 2020-11-14 MED ORDER — MIDAZOLAM HCL 2 MG/2ML IJ SOLN
2.0000 mg | INTRAMUSCULAR | Status: DC | PRN
  Administered 2020-11-14 – 2020-11-15 (×2): 2 mg via INTRAVENOUS
  Filled 2020-11-14: qty 2

## 2020-11-14 MED ORDER — ETOMIDATE 2 MG/ML IV SOLN
20.0000 mg | Freq: Once | INTRAVENOUS | Status: AC
  Administered 2020-11-14: 20 mg via INTRAVENOUS
  Filled 2020-11-14: qty 10

## 2020-11-14 MED ORDER — PROSOURCE TF PO LIQD
45.0000 mL | Freq: Every day | ORAL | Status: DC
  Administered 2020-11-14 – 2020-11-17 (×4): 45 mL
  Filled 2020-11-14 (×4): qty 45

## 2020-11-14 MED ORDER — VECURONIUM BROMIDE 10 MG IV SOLR
20.0000 mg | Freq: Once | INTRAVENOUS | Status: AC
  Administered 2020-11-14: 20 mg via INTRAVENOUS
  Filled 2020-11-14: qty 20

## 2020-11-14 MED ORDER — ACETAMINOPHEN 325 MG PO TABS
650.0000 mg | ORAL_TABLET | Freq: Four times a day (QID) | ORAL | Status: DC | PRN
  Administered 2020-11-15 – 2020-12-02 (×20): 650 mg
  Filled 2020-11-14 (×20): qty 2

## 2020-11-14 MED ORDER — POLYETHYLENE GLYCOL 3350 17 G PO PACK
17.0000 g | PACK | Freq: Every day | ORAL | Status: DC
  Administered 2020-11-14 – 2020-11-24 (×10): 17 g
  Filled 2020-11-14 (×11): qty 1

## 2020-11-14 MED ORDER — FENTANYL CITRATE (PF) 100 MCG/2ML IJ SOLN
INTRAMUSCULAR | Status: AC
  Administered 2020-11-14: 100 ug via INTRAVENOUS
  Filled 2020-11-14: qty 2

## 2020-11-14 MED ORDER — PANTOPRAZOLE SODIUM 40 MG IV SOLR
40.0000 mg | Freq: Every day | INTRAVENOUS | Status: DC
  Administered 2020-11-14 – 2020-12-02 (×19): 40 mg via INTRAVENOUS
  Filled 2020-11-14 (×19): qty 40

## 2020-11-14 MED ORDER — ACETAMINOPHEN 325 MG PO TABS: 650.0000 mg | ORAL_TABLET | Freq: Four times a day (QID) | ORAL | Status: DC | PRN

## 2020-11-14 MED ORDER — PENICILLIN G BENZATHINE 1200000 UNIT/2ML IM SUSY
2.4000 10*6.[IU] | PREFILLED_SYRINGE | INTRAMUSCULAR | Status: DC
  Administered 2020-11-14: 2.4 10*6.[IU] via INTRAMUSCULAR
  Filled 2020-11-14: qty 4

## 2020-11-14 MED ORDER — ROCURONIUM BROMIDE 50 MG/5ML IV SOLN
INTRAVENOUS | Status: AC
  Administered 2020-11-14: 50 mg via INTRAVENOUS
  Filled 2020-11-14: qty 1

## 2020-11-14 MED ORDER — FENTANYL 2500MCG IN NS 250ML (10MCG/ML) PREMIX INFUSION
0.0000 ug/h | INTRAVENOUS | Status: DC
Start: 2020-11-14 — End: 2020-11-19
  Administered 2020-11-14: 100 ug/h via INTRAVENOUS
  Administered 2020-11-15 (×2): 200 ug/h via INTRAVENOUS
  Administered 2020-11-16 (×3): 375 ug/h via INTRAVENOUS
  Administered 2020-11-17: 250 ug/h via INTRAVENOUS
  Administered 2020-11-17 – 2020-11-18 (×3): 300 ug/h via INTRAVENOUS
  Administered 2020-11-18: 350 ug/h via INTRAVENOUS
  Filled 2020-11-14 (×12): qty 250

## 2020-11-14 MED ORDER — PIVOT 1.5 CAL PO LIQD
1000.0000 mL | ORAL | Status: DC
  Administered 2020-11-14 – 2020-11-16 (×3): 1000 mL
  Filled 2020-11-14: qty 1000

## 2020-11-14 MED ORDER — DOCUSATE SODIUM 50 MG/5ML PO LIQD
100.0000 mg | Freq: Two times a day (BID) | ORAL | Status: DC
  Administered 2020-11-14 – 2020-11-24 (×22): 100 mg
  Filled 2020-11-14 (×22): qty 10

## 2020-11-14 MED ORDER — ROCURONIUM BROMIDE 50 MG/5ML IV SOLN: 50.0000 mg | Freq: Once | INTRAVENOUS | Status: AC

## 2020-11-14 MED ORDER — CHLORHEXIDINE GLUCONATE 0.12% ORAL RINSE (MEDLINE KIT)
15.0000 mL | Freq: Two times a day (BID) | OROMUCOSAL | Status: DC
  Administered 2020-11-14 – 2020-11-17 (×7): 15 mL via OROMUCOSAL

## 2020-11-14 MED ORDER — ORAL CARE MOUTH RINSE
15.0000 mL | OROMUCOSAL | Status: DC
  Administered 2020-11-14 – 2020-11-29 (×144): 15 mL via OROMUCOSAL

## 2020-11-14 MED ORDER — PROPOFOL 1000 MG/100ML IV EMUL
5.0000 ug/kg/min | INTRAVENOUS | Status: DC
  Administered 2020-11-14: 25 ug/kg/min via INTRAVENOUS
  Administered 2020-11-14: 30 ug/kg/min via INTRAVENOUS
  Administered 2020-11-15: 20 ug/kg/min via INTRAVENOUS
  Filled 2020-11-14 (×3): qty 100

## 2020-11-14 MED ORDER — MIDAZOLAM HCL 2 MG/2ML IJ SOLN
2.0000 mg | INTRAMUSCULAR | Status: AC | PRN
  Administered 2020-11-14 – 2020-11-15 (×3): 2 mg via INTRAVENOUS
  Filled 2020-11-14 (×4): qty 2

## 2020-11-14 MED ORDER — METHYLPREDNISOLONE SODIUM SUCC 40 MG IJ SOLR
40.0000 mg | Freq: Two times a day (BID) | INTRAMUSCULAR | Status: DC
  Administered 2020-11-14 – 2020-11-28 (×28): 40 mg via INTRAVENOUS
  Filled 2020-11-14 (×28): qty 1

## 2020-11-14 NOTE — Progress Notes (Signed)
Date of Admission:  11/11/2020      ID: Jordan Gibson is a 40 y.o. male  Active Problems:   CAP (community acquired pneumonia)   Sepsis ([place])   Acute respiratory failure with hypoxia ([place])   Hyponatremia   Transaminitis   HIV positive ([facility])    Subjective: intubated  Medications:  . chlorhexidine  15 mL Mouth Rinse BID  . Chlorhexidine Gluconate Cloth  6 each Topical Daily  . docusate  100 mg Per Tube BID  . enoxaparin (LOVENOX) injection  40 mg Subcutaneous Q24H  . furosemide  40 mg Intravenous BID  . insulin aspart  0-9 Units Subcutaneous Q4H  . mouth rinse  15 mL Mouth Rinse q12n4p  . pantoprazole (PROTONIX) IV  40 mg Intravenous Daily  . polyethylene glycol  17 g Per Tube Daily  . predniSONE  40 mg Oral BID WC    Objective: Vital signs in last 24 hours: Temp:  [97 F (36.1 C)-99.4 F (37.4 C)] 97.8 F (36.6 C) (06/03 0900) Pulse Rate:  [87-141] 141 (06/03 1144) Resp:  [21-43] 43 (06/03 1144) BP: (103-134)/(70-88) 114/70 (06/03 0900) SpO2:  [81 %-98 %] 83 % (06/03 1144) FiO2 (%):  [93 %-97 %] 93 % (06/03 0853)  PHYSICAL EXAM:  General: intubated and sedated Lungs:b/l crepts Heart: Tachycardia Abdomen: Soft,  Extremities: atraumatic, no cyanosis. No edema. No clubbing Skin: No rashes or lesions. Or bruising Lymph: Cervical, supraclavicular normal. Neurologic: cannot assessl  Lab Results Recent Labs    11/13/20 0527 11/14/20 0629  WBC 11.6* 11.2*  HGB 12.7* 14.2  HCT 38.2* 41.4  NA 125* 125*  K 4.3 4.3  CL 94* 95*  CO2 23 17*  BUN 19 19  CREATININE 0.94 0.88   Liver Panel No results for input(s): PROT, ALBUMIN, AST, ALT, ALKPHOS, BILITOT, BILIDIR, IBILI in the last 72 hours. Sedimentation Rate No results for input(s): ESRSEDRATE in the last 72 hours. C-Reactive Protein No results for input(s): CRP in the last 72 hours.  Microbiology: HIV RNA 1.5 million copies Cryptococcus neg Toxoplasma IgG  286 IgM 9.3 Fungitell  pending  Studies/Results: DG Abd 1 View  Result Date: 11/14/2020 CLINICAL DATA:  Orogastric tube placement EXAM: ABDOMEN - 1 VIEW COMPARISON:  Portable exam 1234 hours compared to by 01/31/2021 FINDINGS: Tip of orogastric tube projects over gastric antrum. Nonobstructive bowel gas pattern. No bowel dilatation or bowel wall thickening. IMPRESSION: Tip of orogastric tube projects over gastric antrum. Electronically Signed   By: Lavonia Dana M.D.   On: 11/14/2020 13:02   Portable Chest x-ray  Result Date: 11/14/2020 CLINICAL DATA:  Endotracheal tube placement EXAM: PORTABLE CHEST 1 VIEW COMPARISON:  Portable exam 1233 hours compared to 11/12/2020 FINDINGS: Tip of endotracheal tube projects 3.7 cm above carina. Nasogastric tube extends into stomach. Normal heart size, mediastinal contours, and pulmonary vascularity. Diffuse BILATERAL pulmonary infiltrates, probably unchanged when accounting for better degree of pulmonary inflation. No pleural effusion or pneumothorax. IMPRESSION: Persistent diffuse BILATERAL pulmonary infiltrates question multifocal pneumonia. Electronically Signed   By: Lavonia Dana M.D.   On: 11/14/2020 13:02     Assessment/Plan:  Acute hypoxic resp failure with b/l multifocal pneumonia in a patient with AIDS. 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

## 2020-11-14 NOTE — Procedures (Signed)
Intubation Procedure Note  Milan Perkins  [id number]  Aug 26, 1980  Date:11/14/20  Time:12:18 PM   Provider Performing:Viviana Trimble D Elvina Sidle    Procedure: Intubation (31500)  Indication(s) Respiratory Failure  Consent Risks of the procedure as well as the alternatives and risks of each were explained to the patient and/or caregiver.  Consent for the procedure was obtained and is signed in the bedside chart   Anesthesia Etomidate, Fentanyl and Rocuronium   Time Out Verified patient identification, verified procedure, site/side was marked, verified correct patient position, special equipment/implants available, medications/allergies/relevant history reviewed, required imaging and test results available.   Sterile Technique Usual hand hygeine, masks, and gloves were used   Procedure Description Patient positioned in bed supine.  Sedation given as noted above.  Patient was intubated with endotracheal tube using Glidescope.  View was Grade 1 full glottis .  Number of attempts was 1.  Colorimetric CO2 detector was consistent with tracheal placement.   Complications/Tolerance None; patient tolerated the procedure well. Chest X-ray is ordered to verify placement.   EBL Minimal   Specimen(s) None   Size 8 ETT  Tube secured at 23 cm at the lip.    [name], AGACNP-BC [facility] Prefer epic messenger for cross cover needs If after hours, please call E-link

## 2020-11-14 NOTE — Progress Notes (Signed)
PT Cancellation Note  Patient Details Name: Jordan Gibson MRN: [id number] DOB: 1980/06/17   Cancelled Treatment:    Reason Eval/Treat Not Completed: Medical issues which prohibited therapy;Patient not medically ready (Pt has since required a higher level of care. Per policy, pt will need a new or updated PT order for our services to continue. WIll sign off at this time and await new order.)    Genella Bas C 11/14/2020, 9:44 AM

## 2020-11-14 NOTE — Progress Notes (Signed)
GOALS OF CARE DISCUSSION  The Clinical status was relayed to family in detail. Patient and Sister at bedside  Updated and notified of patients medical condition.  Patient is having a weak cough and struggling to remove secretions.   Patient with increased WOB and using accessory muscles to breathe Explained to family course of therapy and the modalities    Patient with Progressive multiorgan failure with a very high probablity of a very minimal chance of meaningful recovery despite all aggressive and optimal medical therapy.  PATIENT REMAINS FULL CODE  Family understands the situation. Plan for biPAP and emergent intubation Prognosis is grim Has active HIV infection   Family are satisfied with Plan of action and management. All questions answered  Additional CC time 25 mins   Jordan Gibson Glad, M.D.  [facility]  Medical Director [facility] Medical Director [facility] Cardio-Pulmonary Department

## 2020-11-14 NOTE — Progress Notes (Signed)
NAME:  Jordan Gibson, MRN:  [id number], DOB:  Apr 03, 1981, LOS: 8 ADMISSION DATE:  10/30/2020  SYNOPSOS 40 y.o. Male admitted with Acute Hypoxic Respiratory Failure and Sepsis in the setting of Community Acquired Pneumonia. Newly diagnosed HIV +   History of Present Illness:  Jordan Gibson is a 40 y.o. Male with no significant past medical history who presented to [facility] ED on 11/07/20 with a 1 week history of shortness of breath, nonproductive cough, and fever. He denied chest pain, abdominal pain, Nausea, vomiting, dysuria, lower extremity pain or swelling.  Denies recent travel or sick contacts.  ED Course:  Upon arrival to ED vitals included: fever with Temperature 102, pulse 132, RR 24, O2 sats 52% on room air.  He was placed on HFNC with improvement of O2 sats to the mid 90's.  Workup significant for WBC 9.2, procalcitonin 3.07, lactic acid 1.9, BNP 44, HS Troponin 65, sodium 126.  Urinalysis is unremarkable.  COVID-19 and Influenza PCR's are both negative.  Chest x-ray with symmetric bilateral pulmonary airspace disease concerning for atypical infection vs. Edema vs hemorrhage. He met sepsis criteria and was started on IV Rocephin and  Azithromycin, along with fluid bolus.  He is to be admitted to [facility] unit by Hospitalist. While in ED, he continues to require high amounts of supplemental O2 and continues to exhibit moderate respiratory distress.  He is at high risk for respiratory deterioration and need for intubation. PCCM is consulted for further assistance and management of Acute Hypoxic Respiratory Failure and Sepsis in the setting of Community Acquired Pneumonia.    Significant Hospital Events: Including procedures, antibiotic start and stop dates in addition to other pertinent events   11/03/2020: Presented to ED, to be admitted to stepdown unit by hospitalist 11/07/2020: Increasing FiO2 requirements and worsening respiratory status, PCCM consulted due to high risk  for deterioration and need for intubation. ABX broadened to Cefepime and Vancomycin, along with Azithromycin 11/08/20- patient with respiratory failure s/p ETT overnight. I met with family at bedside today and reviewed care plan.  Vancomycin dcd today MRSA pcr negative.  11/09/20- working on weaning ventilator and liberating from MV post SBT.  Patient with severe ARDS. I met with family and reviewed care plan. +Rhino/ENtero virus  11/10/20- patient improved, has been extubated to HFNC.      Tranferring to step down unit, signed out to [facility] 6/2 severe hypoxia returned to ICU 6/3 severe hypoxia, HIV diagnosis explained to family,patient failed high flow [place] and biPAP 6/3 emergent intubation    Cultures:  10/18/2020: SARS-CoV-2 and influenza PCR>> negative 11/07/2020: Blood culture x2>> 11/10/2020: Urine culture>> 11/07/2020: Sputum>> 11/07/2020: Strep pneumo urinary antigen>> 11/07/2020: Legionella urinary antigen>> 11/07/2020: Mycoplasma pneumonia>> 11/07/2020: Respiratory viral panel>> 11/07/2020: MRSA PCR>>  Antibiotics Given (last 72 hours)    Date/Time Action Medication Dose Rate   11/11/20 2035 New Bag/Given   sulfamethoxazole-trimethoprim (BACTRIM) 375 mg of trimethoprim in dextrose 5 % 500 mL IVPB 375 mg of trimethoprim 349 mL/hr   11/11/20 2043 New Bag/Given   azithromycin (ZITHROMAX) 500 mg in sodium chloride 0.9 % 250 mL IVPB 500 mg 250 mL/hr   11/12/20 0511 New Bag/Given   sulfamethoxazole-trimethoprim (BACTRIM) 375 mg of trimethoprim in dextrose 5 % 500 mL IVPB 375 mg of trimethoprim 349 mL/hr   11/12/20 1407 New Bag/Given   sulfamethoxazole-trimethoprim (BACTRIM) 375 mg of trimethoprim in dextrose 5 % 500 mL IVPB 375 mg of trimethoprim 349 mL/hr   11/12/20 2037 New Bag/Given  sulfamethoxazole-trimethoprim (BACTRIM) 375 mg of trimethoprim in dextrose 5 % 500 mL IVPB 375 mg of trimethoprim 349 mL/hr   11/13/20 0429 New Bag/Given   sulfamethoxazole-trimethoprim (BACTRIM) 375 mg of  trimethoprim in dextrose 5 % 500 mL IVPB 375 mg of trimethoprim 349 mL/hr   11/13/20 1252 New Bag/Given   sulfamethoxazole-trimethoprim (BACTRIM) 375 mg of trimethoprim in dextrose 5 % 500 mL IVPB 375 mg of trimethoprim 349 mL/hr   11/13/20 2008 New Bag/Given   sulfamethoxazole-trimethoprim (BACTRIM) 375 mg of trimethoprim in dextrose 5 % 500 mL IVPB 375 mg of trimethoprim 349 mL/hr   11/14/20 0409 New Bag/Given   sulfamethoxazole-trimethoprim (BACTRIM) 375 mg of trimethoprim in dextrose 5 % 500 mL IVPB 375 mg of trimethoprim 349 mL/hr       Interim History / Subjective:  Severe resp failure Severe hypoxia Increased WOB       Objective   Blood pressure 114/70, pulse (!) 141, temperature 97.8 F (36.6 C), temperature source Oral, resp. rate (!) 43, height _0  (1.702 m), weight 74.9 kg, SpO2 (!) 83 %.    FiO2 (%):  [93 %-97 %] 93 %   Intake/Output Summary (Last 24 hours) at 11/14/2020 1156 Last data filed at 11/14/2020 1059 Gross per 24 hour  Intake 2931.11 ml  Output 3900 ml  Net -968.89 ml   Filed Weights   10/15/2020 2040 11/07/20 2000 11/11/20 0536  Weight: 77.1 kg 80.1 kg 74.9 kg      REVIEW OF SYSTEMS  PATIENT IS UNABLE TO PROVIDE COMPLETE REVIEW OF SYSTEMS DUE TO SEVERE CRITICAL ILLNESS AND RESP DISTRESS   PHYSICAL EXAMINATION:  GENERAL:critically ill appearing, +resp distress HEAD: Normocephalic, atraumatic.  EYES: Pupils equal, round, reactive to light.  No scleral icterus.  MOUTH: Moist mucosal membrane. NECK: Supple. PULMONARY: +rhonchi, +wheezing CARDIOVASCULAR: S1 and S2. Regular rate and rhythm. No murmurs, rubs, or gallops.  GASTROINTESTINAL: Soft, nontender, -distended. Positive bowel sounds.  MUSCULOSKELETAL: No swelling, clubbing, or edema.  NEUROLOGIC: agitated SKIN:intact,warm,dry   Labs/imaging that I havepersonally reviewed  (right click and "Reselect all SmartList Selections" daily)     ASSESSMENT AND PLAN SYNOPSIS  Acute Hypoxic  Respiratory Failure in the setting of SEVERE ARDS due to Community Acquired Pneumonia with HIV and VIRAL PNEUMONIA WITH PROGRESSIVE ALI/ARDS   Severe ACUTE Hypoxic and Hypercapnic Respiratory Failure Plan for emergent intubation   CARDIAC ICU monitoring  INFECTIOUS DISEASE -continue antibiotics as prescribed -follow up cultures -follow up ID consultation  ENDO - ICU hypoglycemic\Hyperglycemia protocol -check FSBS per protocol   GI GI PROPHYLAXIS as indicated  NUTRITIONAL STATUS DIET--NPO for now Constipation protocol as indicated   ELECTROLYTES -follow labs as needed -replace as needed -pharmacy consultation and following   Best practice (right click and "Reselect all SmartList Selections" daily)  Diet:  NPO Pain/Anxiety/Delirium protocol (if indicated):YES VAP protocol (if indicated): YES DVT prophylaxis: LMWH GI prophylaxis: N/A Glucose control:  SSI No Central venous access:  N/A Arterial line:  N/A Foley:  N/A Mobility:  bed rest  PT consulted: N/A Last date of multidisciplinary goals of care discussion [N/A] Code Status:  full code Disposition: ICU    Labs   CBC: Recent Labs  Lab 11/09/20 0416 11/10/20 0353 11/11/20 0650 11/13/20 0527 11/14/20 0629  WBC 6.0 8.6 7.0  6.9 11.6* 11.2*  NEUTROABS 5.1  --  6.0  --   --   HGB 10.9* 11.6* 12.7*  12.5* 12.7* 14.2  HCT 33.8* 35.2* 37.6*  39.8 38.2* 41.4  MCV 85.8 83.8  82.8  86 81.4 81.0  PLT 242 290 307  293 376 436*    Basic Metabolic Panel: Recent Labs  Lab 11/08/20 0313 11/09/20 0416 11/10/20 0353 11/11/20 0650 11/12/20 0537 11/13/20 0527 11/14/20 0629  NA 130* 137 135 133* 129* 125* 125*  K 3.7 4.7 3.8 3.7 4.3 4.3 4.3  CL 97* 107 101 98 99 94* 95*  CO2 _0 17*  GLUCOSE 109* 170* 102* 88 135* 181* 142*  BUN 15 24* 25* 22* _1 CREATININE 0.99 0.81 0.76 0.73 0.65 0.94 0.88  CALCIUM 8.0* 8.8* 8.5* 8.6* 8.4* 8.6* 9.0  MG 2.1 2.7* 2.5* 2.2  --   --   --   PHOS  5.9* 4.9* 4.0 3.7  --   --   --    GFR: Estimated Creatinine Clearance: 103.3 mL/min (by C-G formula based on SCr of 0.88 mg/dL). Recent Labs  Lab 11/08/20 0313 11/09/20 0416 11/11/20 0650 11/13/20 0527 11/14/20 0629  PROCALCITON 4.56  --   --   --   --   WBC 12.5*   < > 7.0  6.9 11.6* 11.2*  LATICACIDVEN 1.7  --   --   --   --    < > = values in this interval not displayed.    Liver Function Tests: Recent Labs  Lab 11/09/20 0416 11/10/20 0353  AST 33  --   ALT 43  --   ALKPHOS 98  --   BILITOT 0.7  --   PROT 6.8  --   ALBUMIN 2.0* 2.0*   No results for input(s): LIPASE, AMYLASE in the last 168 hours. No results for input(s): AMMONIA in the last 168 hours.  ABG    Component Value Date/Time   PHART 7.30 (L) 11/08/2020 0500   PCO2ART 48 11/08/2020 0500   PO2ART 239 (H) 11/08/2020 0500   HCO3 23.6 11/08/2020 0500   ACIDBASEDEF 3.2 (H) 11/08/2020 0500   O2SAT 99.8 11/08/2020 0500     Coagulation Profile: Recent Labs  Lab 11/08/20 0313  INR 1.3*    Cardiac Enzymes: No results for input(s): CKTOTAL, CKMB, CKMBINDEX, TROPONINI in the last 168 hours.  HbA1C: Hgb A1c MFr Bld  Date/Time Value Ref Range Status  11/12/2020 05:37 AM 5.9 (H) 4.8 - 5.6 % Final    Comment:    (NOTE)         Prediabetes: 5.7 - 6.4         Diabetes: >6.4         Glycemic control for adults with diabetes: <7.0     CBG: Recent Labs  Lab 11/13/20 1452 11/13/20 1940 11/14/20 0041 11/14/20 0356 11/14/20 1138  GLUCAP 256* 174* 160* 145* 114*    Allergies No Known Allergies     DVT/GI PRX  assessed I Assessed the need for Labs I Assessed the need for Foley I Assessed the need for Central Venous Line Family Discussion when available I Assessed the need for Mobilization I made an Assessment of medications to be adjusted accordingly Safety Risk assessment completed  CASE DISCUSSED IN MULTIDISCIPLINARY ROUNDS WITH ICU TEAM     Critical Care Time devoted to patient  care services described in this note is 65  minutes.  Critical care was necessary to treat or prevent imminent or life-threatening deterioration. Overall, patient is critically ill, prognosis is guarded.  Patient with Multiorgan failure and at high risk for cardiac arrest and death.    Corrin Parker,  M.D.  [facility]  Medical Director [place] Director [facility] Cardio-Pulmonary Department

## 2020-11-14 NOTE — Procedures (Signed)
  PROCEDURE: BRONCHOSCOPY Therapeutic Aspiration of Tracheobronchial Tree  PROCEDURE DATE: 11/14/2020  TIME:  NAMEJamerius Gibson  DOB:[birth year]  MRN: [id number] LOC:  IC02A/IC02A-AA    HOSP DAY:  CODE STATUS:      Code Status Orders  (From admission, onward)         Start     Ordered   11/07/20 0152  Full code  Continuous        11/07/20 0153        Code Status History    This patient has a current code status but no historical code status.   Advance Care Planning Activity          Indications/Preliminary Diagnosis: HIIV PNEUMONIA  Consent: (Place X beside choice/s below)  The benefits, risks and possible complications of the procedure were        explained to:  ___ patient  __x_ patient's family  ___ other:___________  who verbalized understanding and gave:  ___ verbal  ___ written  _x__ verbal and written  ___ telephone  ___ other:________ consent.      Unable to obtain consent; procedure performed on emergent basis.     Other:       PRESEDATION ASSESSMENT: History and Physical has been performed. Patient meds and allergies have been reviewed. Presedation airway examination has been performed and documented. Baseline vital signs, sedation score, oxygenation status, and cardiac rhythm were reviewed. Patient was deemed to be in satisfactory condition to undergo the procedure.    PREMEDICATIONS:   Sedative/Narcotic Amt Dose   Versed infusion mg   Fentanyl infusion mcg  Diprivan  mg  VEC 20 mg        PROCEDURE DETAILS: Timeout performed and correct patient, name, & ID confirmed. Following prep per Pulmonary policy, appropriate sedation was administered. The Bronchoscope was inserted in to oral cavity with bite block in place. Therapeutic aspiration of Tracheobronchial tree was performed.  Airway exam proceeded with findings, technical procedures, and specimen collection as noted below. At the end of exam the scope was withdrawn without incident.  Impression and Plan as noted below.         Insertion Route (Place X beside choice below)   Nasal   Oral  x Endotracheal Tube   Tracheostomy   TECHNICAL PROCEDURES: (Place X beside choice below)   Procedures  Description    None     Electrocautery     Cryotherapy     Balloon Dilatation     Bronchography     Stent Placement     Therapeutic Aspiration     Laser/Argon Plasma    Brachytherapy Catheter Placement    Foreign Body Removal         SPECIMENS (Sites): (Place X beside choice below)  Specimens Description   No Specimens Obtained     [place]   x Lavage 20 cc's collected from LUL, RLL   Biopsies    Fine Needle Aspirates    Brushings    Sputum    FINDINGS: thin secretions, no mucus plugs ESTIMATED BLOOD LOSS: none COMPLICATIONS/RESOLUTION: none      IMPRESSION:POST-PROCEDURE DX:  HIV WITH PNEUMONIA    RECOMMENDATION/PLAN:   FOLLOW UP CULTURES ID FOLLOWING   Lucie Leather, M.D.  [facility]  Medical Director [facility] Medical Director [facility] Cardio-Pulmonary Department

## 2020-11-14 NOTE — Progress Notes (Signed)
Time out performed for bedside Bronchoscopy. Right pt, right procedure, No known allergies, consent signed by sister and witnessed by RN.

## 2020-11-14 NOTE — Therapy (Signed)
Time out performed by Corey Skains, RN.  Pt ventilator set at 100% FiO2 for procedure.  Assisted Dr. Belia Heman with 2 BALs.  Specimen collect and sent to the lab.  No adverse reactions noted.  Pt left on 100% FiO2.

## 2020-11-14 NOTE — Consult Note (Addendum)
Pharmacy Antibiotic Note  Jordan Gibson is a 40 y.o. male admitted on 11/05/2020 with acute respiratory failure. 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 Not on HAART. CD4 count 9, viral load 1.5 million. 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 Also tested positive for Rhinovirus/enterovirus, Syphilis, and Toxoplasma. Further infectious disease work-up is pending. ID is following. Pharmacy has been consulted for Bactrim dosing for PJP. Patient is also on IV Solu-medrol. Patient is receiving Penicillin G Benzathine for Syphilis infection.  Patient was intubated 5/28 - 5/30 and then re-intubated 6/3.  Plan:  Continue Bactrim 64m/kg (dosed in trimethoprim) Q8H for total daily dose of 15 mg/kg/day  Monitor renal function and electrolytes (K+) closely   Height: _0  (170.2 cm) Weight: 74.9 kg (165 lb 2 oz) IBW/kg (Calculated) : 66.1  Temp (24hrs), Avg:98 F (36.7 C), Min:97 F (36.1 C), Max:99.1 F (37.3 C)  Recent Labs  Lab 11/08/20 0313 11/09/20 0416 11/10/20 0353 11/11/20 0650 11/12/20 0537 11/13/20 0527 11/14/20 0629  WBC 12.5*   < > 8.6 7.0  6.9  --  11.6* 11.2*  CREATININE 0.99   < > 0.76 0.73 0.65 0.94 0.88  LATICACIDVEN 1.7  --   --   --   --   --   --    < > = values in this interval not displayed.    Estimated Creatinine Clearance: 103.3 mL/min (by C-G formula based on SCr of 0.88 mg/dL).    No Known Allergies  Antimicrobials this admission: 5/26 Azithromycin >> 5/31 5/26 Cefepime >> 5/30 5/27 Vancomycin > 5/28 5/31 Bactrim >>  6/3 Penicillin G Benzathine >>  Dose adjustments this admission: n/a  Microbiology results: 6/3 PJP PCR: pending 6/3 Toxoplasma PCR: pending 6/3 Histoplasma urinary Ag: pending 6/3 BAL: pending 6/1 RPR: Reactive, titer 1:4, Ab (+) 6/1 Cryptococcus Ag: (-) 5/31 Hep B/C screen: (-) 5/31 Quantiferon-TB: (-) 5/31 Histoplasma [place]: pending 5/31 Fungitell: pending 5/28 Tracheal aspirate: Normal respiratory flora 5/28 Respiratory  panel: (+) Rhinovirus / enterovirus 5/28 SARS-CoV-2 / Influenza A / B: (-) 5/28: Mycoplasma Ab: (-) 5/27 Strep pneumo urinary Ag: (-) 5/27 Legionella urinary Ag: (-) 5/27 MRSA PCR: (-) 5/26 SARS-CoV-2 / Influenza A / B: (-) 5/26 Ucx: (-) 5/26 Bcx: NG  Thank you for allowing pharmacy to be a part of this patient's care.  ABenita Gutter6/08/2020 3:48 PM

## 2020-11-14 NOTE — Progress Notes (Signed)
Pt having increased oxygen requirements. HR sustaining 140s. MD made aware and came to bedside to talk to pt and sister with interpretor.

## 2020-11-14 NOTE — Progress Notes (Addendum)
Nutrition Follow-up  DOCUMENTATION CODES:  Not applicable  INTERVENTION:  Initiate tube feeding via OGT:  Pivot 1.5 at 45 ml/h (1080 ml per day)  Prosource TF 45 ml 1x/d  Free water: 39mL of water q4h  Provides 1976 kcal, 112 gm protein, 990 ml free water daily (free water+TF) TF+propofol = 2016 kcal/d  NUTRITION DIAGNOSIS: Inadequate oral intake related to inability to eat as evidenced by NPO status.  GOAL:  Patient will meet greater than or equal to 90% of their needs  MONITOR:  Vent status,Labs,Weight trends,I & O's  REASON FOR ASSESSMENT:  Malnutrition Screening Tool,Ventilator    ASSESSMENT:  40 year old male who presented to the ED on 5/26 with SOB and fever. No significant PMH. Pt admitted with CAP, sepsis.   Pt able to be extubated and was moved to floor 5/30. Respiratory status declined and was moved back to ICU 6/2. Limited intake recorded this admission but wight loss is noted in the last week. Could be related to fluid losses. ID consulting, pt dx with HIV this admission, antiretroviral therapy to begin after discharge.  Pt re-intubated this afternoon. Minimal intake this admission, would recommend early initiation of TF as intake has likely been inadequate.  Pt resting in bed at the time of visit, intubated and sedated. No family at bedside. Overall appears well nourished, slight muscle deficits seen around the clavicle and temples. OK to start feeds per MD. OGT in place.   5/26 - admitted to ICU with sepsis and poor respiratory status 5/28 - intubated 5/30 - extubated 6/3 - intubation (in process)  +OGT in place (gastric) confirmed with XR 6/3  Patient is currently intubated on ventilator support MV: 8.9 L/min Temp (24hrs), Avg:98.1 F (36.7 C), Min:97 F (36.1 C), Max:99.4 F (37.4 C)  Propofol: 13.5 ml/hr (356 kcal/d)   Intake/Output Summary (Last 24 hours) at 11/14/2020 1311 Last data filed at 11/14/2020 1213 Gross per 24 hour  Intake 2931.11 ml   Output 4075 ml  Net -1143.89 ml  Net IO Since Admission: -6,745.04 mL [11/14/20 1311]  Average Meal Intake: . 5/30-6/2: 100% intake x 1 recorded meal (limited meal intake records)  Nutritionally Relevant Medications: Scheduled Meds: . furosemide  40 mg Intravenous BID  . insulin aspart  0-9 Units Subcutaneous Q4H  . predniSONE  40 mg Oral BID WC   Continuous Infusions: . sulfamethoxazole-trimethoprim 375 mg of trimethoprim (11/14/20 0409)   PRN Meds: ondansetron, phenol  Labs reviewed:   Na 125 / Chloride 95  SBG ranges from 109-256 mg/dL over the last 24 hours  HgbA1c 5.9% (6/1)  NUTRITION - FOCUSED PHYSICAL EXAM: Flowsheet Row Most Recent Value  Orbital Region No depletion  Upper Arm Region No depletion  Thoracic and Lumbar Region No depletion  Buccal Region No depletion  Temple Region Mild depletion  Clavicle Bone Region Mild depletion  Clavicle and Acromion Bone Region No depletion  Scapular Bone Region No depletion  Dorsal Hand No depletion  Patellar Region No depletion  Anterior Thigh Region No depletion  Posterior Calf Region No depletion  Edema (RD Assessment) None  Hair Reviewed  Eyes Unable to assess  Mouth Unable to assess  Skin Reviewed  Nails Reviewed     Diet Order:   Diet Order            Diet NPO time specified  Diet effective now                EDUCATION NEEDS:  No education needs have been  identified at this time  Skin:  Skin Assessment: Reviewed RN Assessment  Last BM:  6/1 per RN documentation  Height:  Ht Readings from Last 1 Encounters:  11/07/20 5\' 7"  (1.702 m)   Weight:  Wt Readings from Last 1 Encounters:  11/11/20 74.9 kg    BMI:  Body mass index is 25.86 kg/m.  Estimated Nutritional Needs:  Kcal:  1860  Protein:  110-120   Fluid:  1.8-2 L/d   11/13/20, RD, LDN Clinical Dietitian Pager on Amion

## 2020-11-14 NOTE — Progress Notes (Signed)
Case discussed with Dr. Belia Heman.  He will take over the care.  Patient is at high risk of decompensation and may require intubation.  TRH will sign off at this time.

## 2020-11-15 ENCOUNTER — Inpatient Hospital Stay: Payer: Medicaid Other

## 2020-11-15 LAB — CREATININE, SERUM
Creatinine, Ser: 2.33 mg/dL — ABNORMAL HIGH (ref 0.61–1.24)
GFR, Estimated: 35 mL/min — ABNORMAL LOW (ref >60)

## 2020-11-15 LAB — CBC WITH DIFFERENTIAL/PLATELET
Abs Immature Granulocytes: 0.2 10*3/uL — ABNORMAL HIGH (ref 0.00–0.07)
Basophils Absolute: 0 10*3/uL (ref 0.0–0.1)
Basophils Relative: 0 %
Eosinophils Absolute: 0 10*3/uL (ref 0.0–0.5)
Eosinophils Relative: 0 %
HCT: 42.8 % (ref 39.0–52.0)
Hemoglobin: 14.3 g/dL (ref 13.0–17.0)
Immature Granulocytes: 2 %
Lymphocytes Relative: 5 %
Lymphs Abs: 0.6 10*3/uL — ABNORMAL LOW (ref 0.7–4.0)
MCH: 27.2 pg (ref 26.0–34.0)
MCHC: 33.4 g/dL (ref 30.0–36.0)
MCV: 81.5 fL (ref 80.0–100.0)
Monocytes Absolute: 0.4 10*3/uL (ref 0.1–1.0)
Monocytes Relative: 3 %
Neutro Abs: 11.2 10*3/uL — ABNORMAL HIGH (ref 1.7–7.7)
Neutrophils Relative %: 90 %
Platelets: 407 10*3/uL — ABNORMAL HIGH (ref 150–400)
RBC: 5.25 MIL/uL (ref 4.22–5.81)
RDW: 13.3 % (ref 11.5–15.5)
WBC: 12.4 10*3/uL — ABNORMAL HIGH (ref 4.0–10.5)
nRBC: 0 % (ref 0.0–0.2)

## 2020-11-15 LAB — BLOOD GAS, ARTERIAL
Acid-base deficit: 7 mmol/L — ABNORMAL HIGH (ref 0.0–2.0)
Bicarbonate: 22.3 mmol/L (ref 20.0–28.0)
FIO2: 1
Mechanical Rate: 20
O2 Saturation: 95.8 %
Patient temperature: 37
pCO2 arterial: 61 mmHg — ABNORMAL HIGH (ref 32.0–48.0)
pH, Arterial: 7.17 — CL (ref 7.350–7.450)
pO2, Arterial: 100 mmHg (ref 83.0–108.0)

## 2020-11-15 LAB — POTASSIUM: Potassium: 5.5 mmol/L — ABNORMAL HIGH (ref 3.5–5.1)

## 2020-11-15 LAB — PHOSPHORUS
Phosphorus: 6.5 mg/dL — ABNORMAL HIGH (ref 2.5–4.6)
Phosphorus: 4.1 mg/dL (ref 2.5–4.6)

## 2020-11-15 LAB — GLUCOSE, CAPILLARY
Glucose-Capillary: 144 mg/dL — ABNORMAL HIGH (ref 70–99)
Glucose-Capillary: 155 mg/dL — ABNORMAL HIGH (ref 70–99)
Glucose-Capillary: 159 mg/dL — ABNORMAL HIGH (ref 70–99)
Glucose-Capillary: 164 mg/dL — ABNORMAL HIGH (ref 70–99)
Glucose-Capillary: 220 mg/dL — ABNORMAL HIGH (ref 70–99)
Glucose-Capillary: 275 mg/dL — ABNORMAL HIGH (ref 70–99)

## 2020-11-15 LAB — COMPREHENSIVE METABOLIC PANEL
ALT: 79 U/L — ABNORMAL HIGH (ref 0–44)
AST: 28 U/L (ref 15–41)
Albumin: 2.7 g/dL — ABNORMAL LOW (ref 3.5–5.0)
Alkaline Phosphatase: 150 U/L — ABNORMAL HIGH (ref 38–126)
Anion gap: 10 (ref 5–15)
BUN: 43 mg/dL — ABNORMAL HIGH (ref 6–20)
CO2: 21 mmol/L — ABNORMAL LOW (ref 22–32)
Calcium: 9.1 mg/dL (ref 8.9–10.3)
Chloride: 93 mmol/L — ABNORMAL LOW (ref 98–111)
Creatinine, Ser: 1.54 mg/dL — ABNORMAL HIGH (ref 0.61–1.24)
GFR, Estimated: 58 mL/min — ABNORMAL LOW (ref >60)
Glucose, Bld: 235 mg/dL — ABNORMAL HIGH (ref 70–99)
Potassium: 5.4 mmol/L — ABNORMAL HIGH (ref 3.5–5.1)
Sodium: 124 mmol/L — ABNORMAL LOW (ref 135–145)
Total Bilirubin: 0.5 mg/dL (ref 0.3–1.2)
Total Protein: 7.4 g/dL (ref 6.5–8.1)

## 2020-11-15 LAB — MISC LABCORP TEST (SEND OUT): Labcorp test code: 164284

## 2020-11-15 LAB — FUNGITELL, SERUM: Fungitell Result: >500 pg/mL — ABNORMAL HIGH (ref <80)

## 2020-11-15 LAB — BRAIN NATRIURETIC PEPTIDE: B Natriuretic Peptide: 35.5 pg/mL (ref 0.0–100.0)

## 2020-11-15 LAB — MAGNESIUM
Magnesium: 2.8 mg/dL — ABNORMAL HIGH (ref 1.7–2.4)
Magnesium: 2.4 mg/dL (ref 1.7–2.4)

## 2020-11-15 LAB — TRIGLYCERIDES: Triglycerides: 286 mg/dL — ABNORMAL HIGH (ref <150)

## 2020-11-15 LAB — LACTIC ACID, PLASMA: Lactic Acid, Venous: 3.3 mmol/L (ref 0.5–1.9)

## 2020-11-15 MED ORDER — VECURONIUM BROMIDE 10 MG IV SOLR
10.0000 mg | INTRAVENOUS | Status: DC | PRN
  Administered 2020-11-15 – 2020-12-02 (×51): 10 mg via INTRAVENOUS
  Filled 2020-11-15 (×54): qty 10

## 2020-11-15 MED ORDER — SODIUM CHLORIDE 0.9 % IV SOLN
250.0000 mL | INTRAVENOUS | Status: DC
  Administered 2020-11-15 – 2020-11-30 (×8): 250 mL via INTRAVENOUS

## 2020-11-15 MED ORDER — SODIUM BICARBONATE 8.4 % IV SOLN: 50.0000 meq | Freq: Once | INTRAVENOUS | Status: AC

## 2020-11-15 MED ORDER — MIDAZOLAM 50MG/50ML (1MG/ML) PREMIX INFUSION
0.5000 mg/h | INTRAVENOUS | Status: DC
  Administered 2020-11-15: 10 mg/h via INTRAVENOUS
  Administered 2020-11-15: 0.5 mg/h via INTRAVENOUS
  Administered 2020-11-16: 10 mg/h via INTRAVENOUS
  Administered 2020-11-16: 8 mg/h via INTRAVENOUS
  Administered 2020-11-16 (×2): 10 mg/h via INTRAVENOUS
  Administered 2020-11-17: 2 mg/h via INTRAVENOUS
  Administered 2020-11-18: 10 mg/h via INTRAVENOUS
  Administered 2020-11-18: 3 mg/h via INTRAVENOUS
  Administered 2020-11-18 – 2020-11-22 (×18): 10 mg/h via INTRAVENOUS
  Filled 2020-11-15 (×27): qty 50

## 2020-11-15 MED ORDER — LACTATED RINGERS IV BOLUS
1000.0000 mL | Freq: Once | INTRAVENOUS | Status: AC
  Administered 2020-11-15: 1000 mL via INTRAVENOUS

## 2020-11-15 MED ORDER — SODIUM ZIRCONIUM CYCLOSILICATE 5 G PO PACK
10.0000 g | PACK | Freq: Once | ORAL | Status: AC
  Administered 2020-11-15: 10 g
  Filled 2020-11-15: qty 2

## 2020-11-15 MED ORDER — SODIUM BICARBONATE 8.4 % IV SOLN
INTRAVENOUS | Status: AC
  Administered 2020-11-15: 50 meq via INTRAVENOUS
  Filled 2020-11-15: qty 50

## 2020-11-15 MED ORDER — SODIUM CHLORIDE 0.9 % IV SOLN
100.0000 mg | INTRAVENOUS | Status: DC
  Filled 2020-11-15: qty 100

## 2020-11-15 MED ORDER — DEXMEDETOMIDINE HCL IN NACL 400 MCG/100ML IV SOLN
0.4000 ug/kg/h | INTRAVENOUS | Status: DC
  Administered 2020-11-15: 1.2 ug/kg/h via INTRAVENOUS
  Administered 2020-11-15: 0.4 ug/kg/h via INTRAVENOUS
  Administered 2020-11-16 (×4): 1.2 ug/kg/h via INTRAVENOUS
  Administered 2020-11-17: 0.9 ug/kg/h via INTRAVENOUS
  Administered 2020-11-17 (×2): 0.8 ug/kg/h via INTRAVENOUS
  Administered 2020-11-17 – 2020-11-20 (×15): 1.2 ug/kg/h via INTRAVENOUS
  Administered 2020-11-21 – 2020-11-22 (×13): 1.5 ug/kg/h via INTRAVENOUS
  Administered 2020-11-23: 0.7 ug/kg/h via INTRAVENOUS
  Administered 2020-11-23: 1 ug/kg/h via INTRAVENOUS
  Administered 2020-11-23: 1.2 ug/kg/h via INTRAVENOUS
  Administered 2020-11-23 (×2): 1 ug/kg/h via INTRAVENOUS
  Administered 2020-11-24 (×4): 1.2 ug/kg/h via INTRAVENOUS
  Administered 2020-11-25: 0.6 ug/kg/h via INTRAVENOUS
  Administered 2020-11-25 (×2): 1.5 ug/kg/h via INTRAVENOUS
  Administered 2020-11-25: 1 ug/kg/h via INTRAVENOUS
  Administered 2020-11-25: 1.4 ug/kg/h via INTRAVENOUS
  Administered 2020-11-25 – 2020-11-26 (×3): 1.2 ug/kg/h via INTRAVENOUS
  Filled 2020-11-15 (×59): qty 100

## 2020-11-15 MED ORDER — SODIUM CHLORIDE 0.9 % IV BOLUS
1000.0000 mL | Freq: Once | INTRAVENOUS | Status: AC
  Administered 2020-11-15: 1000 mL via INTRAVENOUS

## 2020-11-15 MED ORDER — NOREPINEPHRINE 4 MG/250ML-% IV SOLN
0.0000 ug/min | INTRAVENOUS | Status: DC
  Administered 2020-11-15 – 2020-11-19 (×2): 2 ug/min via INTRAVENOUS
  Filled 2020-11-15 (×4): qty 250

## 2020-11-15 MED ORDER — INSULIN ASPART 100 UNIT/ML IJ SOLN
0.0000 [IU] | INTRAMUSCULAR | Status: DC
  Administered 2020-11-15: 3 [IU] via SUBCUTANEOUS
  Administered 2020-11-17: 7 [IU] via SUBCUTANEOUS
  Administered 2020-11-17: 3 [IU] via SUBCUTANEOUS
  Administered 2020-11-17 (×2): 7 [IU] via SUBCUTANEOUS
  Administered 2020-11-17 (×2): 4 [IU] via SUBCUTANEOUS
  Administered 2020-11-18: 3 [IU] via SUBCUTANEOUS
  Administered 2020-11-18 (×3): 4 [IU] via SUBCUTANEOUS
  Administered 2020-11-18: 3 [IU] via SUBCUTANEOUS
  Administered 2020-11-18 – 2020-11-20 (×8): 4 [IU] via SUBCUTANEOUS
  Administered 2020-11-20: 7 [IU] via SUBCUTANEOUS
  Administered 2020-11-20 – 2020-11-21 (×2): 4 [IU] via SUBCUTANEOUS
  Administered 2020-11-21 (×2): 3 [IU] via SUBCUTANEOUS
  Administered 2020-11-21: 7 [IU] via SUBCUTANEOUS
  Administered 2020-11-21 (×2): 4 [IU] via SUBCUTANEOUS
  Administered 2020-11-22: 3 [IU] via SUBCUTANEOUS
  Administered 2020-11-22 (×3): 4 [IU] via SUBCUTANEOUS
  Administered 2020-11-22: 7 [IU] via SUBCUTANEOUS
  Administered 2020-11-23: 3 [IU] via SUBCUTANEOUS
  Administered 2020-11-23: 11 [IU] via SUBCUTANEOUS
  Administered 2020-11-23: 7 [IU] via SUBCUTANEOUS
  Administered 2020-11-23 – 2020-11-24 (×4): 3 [IU] via SUBCUTANEOUS
  Administered 2020-11-24: 11 [IU] via SUBCUTANEOUS
  Administered 2020-11-24 (×2): 3 [IU] via SUBCUTANEOUS
  Administered 2020-11-24: 4 [IU] via SUBCUTANEOUS
  Administered 2020-11-24: 7 [IU] via SUBCUTANEOUS
  Administered 2020-11-25: 3 [IU] via SUBCUTANEOUS
  Administered 2020-11-25 (×3): 4 [IU] via SUBCUTANEOUS
  Administered 2020-11-25: 7 [IU] via SUBCUTANEOUS
  Administered 2020-11-26: 3 [IU] via SUBCUTANEOUS
  Administered 2020-11-26: 4 [IU] via SUBCUTANEOUS
  Administered 2020-11-27: 7 [IU] via SUBCUTANEOUS
  Administered 2020-11-27: 3 [IU] via SUBCUTANEOUS
  Administered 2020-11-28: 4 [IU] via SUBCUTANEOUS
  Administered 2020-11-28 (×3): 3 [IU] via SUBCUTANEOUS
  Administered 2020-11-29: 4 [IU] via SUBCUTANEOUS
  Administered 2020-11-29: 3 [IU] via SUBCUTANEOUS
  Administered 2020-11-29 – 2020-11-30 (×2): 4 [IU] via SUBCUTANEOUS
  Administered 2020-11-30: 3 [IU] via SUBCUTANEOUS
  Administered 2020-11-30 (×2): 4 [IU] via SUBCUTANEOUS
  Administered 2020-12-01 (×2): 3 [IU] via SUBCUTANEOUS
  Administered 2020-12-01 – 2020-12-02 (×3): 4 [IU] via SUBCUTANEOUS
  Filled 2020-11-15 (×69): qty 1

## 2020-11-15 MED ORDER — MIDAZOLAM BOLUS VIA INFUSION
2.0000 mg | INTRAVENOUS | Status: DC | PRN
  Administered 2020-11-17 – 2020-11-19 (×4): 2 mg via INTRAVENOUS
  Administered 2020-11-19: 4 mg via INTRAVENOUS
  Administered 2020-11-20: 2 mg via INTRAVENOUS
  Administered 2020-11-20 – 2020-11-22 (×6): 4 mg via INTRAVENOUS
  Filled 2020-11-15: qty 4

## 2020-11-15 MED ORDER — VECURONIUM BROMIDE 10 MG IV SOLR: 10.0000 mg | Freq: Once | INTRAVENOUS | Status: AC

## 2020-11-15 MED ORDER — LACTATED RINGERS IV BOLUS: 1000.0000 mL | Freq: Once | INTRAVENOUS | Status: DC

## 2020-11-15 MED ORDER — SODIUM CHLORIDE 0.9 % IV SOLN
200.0000 mg | Freq: Once | INTRAVENOUS | Status: AC
  Administered 2020-11-15: 200 mg via INTRAVENOUS
  Filled 2020-11-15: qty 200

## 2020-11-15 MED ORDER — FREE WATER
20.0000 mL | Status: DC
  Administered 2020-11-16 – 2020-12-02 (×93): 20 mL

## 2020-11-15 MED ORDER — VECURONIUM BROMIDE 10 MG IV SOLR
INTRAVENOUS | Status: AC
  Administered 2020-11-15: 10 mg via INTRAVENOUS
  Filled 2020-11-15: qty 10

## 2020-11-15 MED ORDER — STERILE WATER FOR INJECTION IJ SOLN
INTRAMUSCULAR | Status: AC
  Administered 2020-11-15: 10 mL
  Filled 2020-11-15: qty 10

## 2020-11-15 MED ORDER — LACTATED RINGERS IV BOLUS: 500.0000 mL | Freq: Once | INTRAVENOUS | Status: DC

## 2020-11-15 NOTE — Progress Notes (Signed)
Critical Lactic 3.3 received. Britton-Lee, NP made aware.

## 2020-11-15 NOTE — Procedures (Signed)
Central Venous Catheter Insertion Procedure Note  Jordan Gibson  [id number]  [birth year]  Date:11/15/20  Time:8:32 PM   Provider Performing:Donta Fuster L Rust-Chester   Procedure: Insertion of Non-tunneled Central Venous [phone]) with US guidance ([id number])   Indication(s) Medication administration  Consent Risks of the procedure as well as the alternatives and risks of each were explained to the patient and/or caregiver.  Consent for the procedure was obtained and is signed in the bedside chart  Anesthesia Topical only with 1% lidocaine   Timeout Verified patient identification, verified procedure, site/side was marked, verified correct patient position, special equipment/implants available, medications/allergies/relevant history reviewed, required imaging and test results available.  Sterile Technique Maximal sterile technique including full sterile barrier drape, hand hygiene, sterile gown, sterile gloves, mask, hair covering, sterile ultrasound probe cover (if used).  Procedure Description Area of catheter insertion was cleaned with chlorhexidine and draped in sterile fashion.  With real-time ultrasound guidance a central venous catheter was placed into the left internal jugular vein. Nonpulsatile blood flow and easy flushing noted in all ports.  The catheter was sutured in place and sterile dressing applied.  Complications/Tolerance None; patient tolerated the procedure well. Chest X-ray is ordered to verify placement for internal jugular or subclavian cannulation.   Chest x-ray is not ordered for femoral cannulation.  EBL Minimal  Specimen(s) None    Betsey Holiday, AGACNP-BC Acute Care Nurse Practitioner [facility]   [phone] / [phone] Please see Amion for pager details.

## 2020-11-15 NOTE — Progress Notes (Signed)
NAME:  Jordan Gibson, MRN:  [id number], DOB:  [birth year], LOS: 9 ADMISSION DATE:  11/10/2020 40 y.o. Male admitted with Acute Hypoxic Respiratory Failure and Sepsis in the setting of Community Acquired Pneumonia. Newly diagnosed HIV +   History of Present Illness:  Jordan Gibson is a 40 y.o. Male with no significant past medical history who presented to [facility] ED on 11/07/20 with a 1 week history of shortness of breath, nonproductive cough, and fever. He denied chest pain, abdominal pain, Nausea, vomiting, dysuria, lower extremity pain or swelling. Denies recent travel or sick contacts.  ED Course:  Upon arrival to ED vitals included: fever with Temperature 102, pulse 132, RR 24, O2 sats 52% on room air. He was placed on HFNC with improvement of O2 sats to the mid 90's. Workup significant for WBC 9.2, procalcitonin 3.07, lactic acid 1.9, BNP 44, HS Troponin 65, sodium 126. Urinalysis is unremarkable. COVID-19 and Influenza PCR's are both negative. Chest x-ray with symmetric bilateral pulmonary airspace disease concerning for atypical infection vs. Edema vs hemorrhage. He met sepsis criteria and was started on IV Rocephin and Azithromycin, along with fluid bolus. He is to be admitted to [facility] unit by Hospitalist. While in ED, he continues to require high amounts of supplemental O2 and continues to exhibit moderate respiratory distress. He is at high risk for respiratory deterioration and need for intubation. PCCM is consulted for further assistance and management of Acute Hypoxic Respiratory Failure and Sepsis in the setting of Community Acquired Pneumonia.   Significant Hospital Events: Including procedures, antibiotic start and stop dates in addition to other pertinent events   10/18/2020:Presented to ED, to be admitted to stepdown unit by hospitalist 11/07/2020: Increasing FiO2 requirements and worsening respiratory status, PCCM consulted due to high risk for  deterioration and need for intubation. ABX broadened to Cefepime and Vancomycin, along with Azithromycin 11/08/20- patient with respiratory failure s/p ETT overnight. I met with family at bedside today and reviewed care plan. Vancomycin dcd today MRSA pcr negative.  11/09/20- working on weaning ventilator and liberating from MV post SBT. Patient with severe ARDS. I met with family and reviewed care plan. +Rhino/ENtero virus  11/10/20- patient improved, has been extubated to HFNC.     Tranferring to step down unit, signed out to [facility] 6/2 severe hypoxia returned to ICU 6/3 severe hypoxia, HIV diagnosis explained to family,patient failed high flow [place] and biPAP 6/3 emergent intubation, BRONCH BAL obtained 6/4 severe hypoxia   Cultures:  10/30/2020: SARS-CoV-2 and influenza PCR>> negative 11/10/2020: Blood culture x2>> 11/03/2020: Urine culture>> 11/07/2020: Sputum>> 11/07/2020: Strep pneumo urinary antigen>> 11/07/2020: Legionella urinary antigen>> 11/07/2020: Mycoplasma pneumonia>> 11/07/2020: Respiratory viral panel>> 11/07/2020: MRSA PCR>>    Interim History / Subjective:  Remains critically ill Severe hypoxia +AIDS +ALI/ARDS       Objective   Blood pressure 105/68, pulse 95, temperature 97.7 F (36.5 C), resp. rate 13, height _0  (1.702 m), weight 74.9 kg, SpO2 94 %.    Vent Mode: PRVC FiO2 (%):  [50 %-100 %] 50 % Set Rate:  [16 bmp-20 bmp] 20 bmp Vt Set:  [450 mL-500 mL] 500 mL PEEP:  [10 cmH20] 10 cmH20 Plateau Pressure:  [22 cmH20] 22 cmH20   Intake/Output Summary (Last 24 hours) at 11/15/2020 0739 Last data filed at 11/15/2020 0134 Gross per 24 hour  Intake 4450.49 ml  Output 1600 ml  Net 2850.49 ml   Filed Weights   10/24/2020 2040 11/07/20 2000 11/11/20 0536  Weight: 77.1  kg 80.1 kg 74.9 kg      REVIEW OF SYSTEMS  PATIENT IS UNABLE TO PROVIDE COMPLETE REVIEW OF SYSTEMS DUE TO SEVERE CRITICAL ILLNESS AND TOXIC METABOLIC ENCEPHALOPATHY   PHYSICAL  EXAMINATION:  GENERAL:critically ill appearing, +resp distress HEAD: Normocephalic, atraumatic.  EYES: Pupils equal, round, reactive to light.  No scleral icterus.  MOUTH: Moist mucosal membrane. NECK: Supple. PULMONARY: +rhonchi, +wheezing CARDIOVASCULAR: S1 and S2. Regular rate and rhythm. No murmurs, rubs, or gallops.  GASTROINTESTINAL: Soft, nontender, -distended. Positive bowel sounds.  MUSCULOSKELETAL: No swelling, clubbing, or edema.  NEUROLOGIC: obtunded SKIN:intact,warm,dry   Labs/imaging that I havepersonally reviewed  (right click and "Reselect all SmartList Selections" daily)      ASSESSMENT AND PLAN SYNOPSIS   Admitted to ICU for severe and Acute Hypoxic Respiratory Failure in the setting of SEVERE ARDS due to Community Acquired Pneumonia with HIV/AIDS and VIRAL PNEUMONIA WITH PROGRESSIVE ALI/ARDS    Severe ACUTE Hypoxic and Hypercapnic Respiratory Failure -continue Mechanical Ventilator support -continue Bronchodilator Therapy -Wean Fio2 and PEEP as tolerated -VAP/VENT bundle implementation -will NOT perform SAT/SBT    CARDIAC ICU monitoring   NEUROLOGY Acute toxic metabolic encephalopathy, need for sedation Goal RASS -2 to -3   INFECTIOUS DISEASE -continue antibiotics as prescribed -follow up cultures -follow up ID consultation  ENDO - ICU hypoglycemic\Hyperglycemia protocol -check FSBS per protocol   GI GI PROPHYLAXIS as indicated  NUTRITIONAL STATUS DIET-->TF's as tolerated Constipation protocol as indicated   ELECTROLYTES -follow labs as needed -replace as needed -pharmacy consultation and following   Best practice (right click and "Reselect all SmartList Selections" daily)  Diet: NPO Pain/Anxiety/Delirium protocol (if indicated):YES VAP protocol (if indicated): YES DVT prophylaxis: LMWH GI prophylaxis: PPI Glucose control: SSI No Central venous access: N/A Arterial line: N/A Foley: N/A Mobility: bed rest PT  consulted: N/A Last date of multidisciplinary goals of care discussion[N/A] Code Status: full code Disposition:ICU     Labs   CBC: Recent Labs  Lab 11/09/20 0416 11/10/20 0353 11/11/20 0650 11/13/20 0527 11/14/20 0629 11/15/20 0509  WBC 6.0   < > 7.0  6.9 11.6* 11.2* 12.4*  NEUTROABS 5.1  --  6.0  --   --  11.2*  HGB 10.9*   < > 12.7*  12.5* 12.7* 14.2 14.3  HCT 33.8*   < > 37.6*  39.8 38.2* 41.4 42.8  MCV 85.8   < > 82.8  86 81.4 81.0 81.5  PLT 242   < > 307  293 376 436* 407*   < > = values in this interval not displayed.    Basic Metabolic Panel: Recent Labs  Lab 11/09/20 0416 11/10/20 0353 11/11/20 0650 11/12/20 0537 11/13/20 0527 11/14/20 0629 11/14/20 1732 11/15/20 0509  NA 137 135 133* 129* 125* 125*  --  124*  K 4.7 3.8 3.7 4.3 4.3 4.3  --  5.4*  CL 107 101 98 99 94* 95*  --  93*  CO2 _0 17*  --  21*  GLUCOSE 170* 102* 88 135* 181* 142*  --  235*  BUN 24* 25* 22* _1 --  43*  CREATININE 0.81 0.76 0.73 0.65 0.94 0.88  --  1.54*  CALCIUM 8.8* 8.5* 8.6* 8.4* 8.6* 9.0  --  9.1  MG 2.7* 2.5* 2.2  --   --   --  2.7* 2.8*  PHOS 4.9* 4.0 3.7  --   --   --  7.6* 6.5*   GFR: Estimated Creatinine Clearance:  59 mL/min (A) (by C-G formula based on SCr of 1.54 mg/dL (H)). Recent Labs  Lab 11/11/20 0650 11/13/20 0527 11/14/20 0629 11/15/20 0509  WBC 7.0  6.9 11.6* 11.2* 12.4*    Liver Function Tests: Recent Labs  Lab 11/09/20 0416 11/10/20 0353 11/15/20 0509  AST 33  --  28  ALT 43  --  79*  ALKPHOS 98  --  150*  BILITOT 0.7  --  0.5  PROT 6.8  --  7.4  ALBUMIN 2.0* 2.0* 2.7*   No results for input(s): LIPASE, AMYLASE in the last 168 hours. No results for input(s): AMMONIA in the last 168 hours.  ABG    Component Value Date/Time   PHART 7.33 (L) 11/15/2020 0500   PCO2ART 38 11/15/2020 0500   PO2ART 98 11/15/2020 0500   HCO3 20.0 11/15/2020 0500   ACIDBASEDEF 5.4 (H) 11/15/2020 0500   O2SAT 97.1 11/15/2020 0500      Coagulation Profile: No results for input(s): INR, PROTIME in the last 168 hours.  Cardiac Enzymes: No results for input(s): CKTOTAL, CKMB, CKMBINDEX, TROPONINI in the last 168 hours.  HbA1C: Hgb A1c MFr Bld  Date/Time Value Ref Range Status  11/12/2020 05:37 AM 5.9 (H) 4.8 - 5.6 % Final    Comment:    (NOTE)         Prediabetes: 5.7 - 6.4         Diabetes: >6.4         Glycemic control for adults with diabetes: <7.0     CBG: Recent Labs  Lab 11/14/20 1138 11/14/20 1614 11/14/20 1931 11/14/20 2327 11/15/20 0324  GLUCAP 114* 276* 168* 174* 159*    Allergies No Known Allergies     DVT/GI PRX  assessed I Assessed the need for Labs I Assessed the need for Foley I Assessed the need for Central Venous Line Family Discussion when available I Assessed the need for Mobilization I made an Assessment of medications to be adjusted accordingly Safety Risk assessment completed    Critical Care Time devoted to patient care services described in this note is  55 minutes.  Critical care was necessary to treat or prevent imminent or life-threatening deterioration. Overall, patient is critically ill, prognosis is guarded.     Corrin Parker, M.D.  [facility]  Medical Director [place] Director [facility] Cardio-Pulmonary Department

## 2020-11-15 NOTE — Progress Notes (Signed)
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 ABG post these changes revealed a PF ratio of 100 & respiratory acidosis: 7.17/ 61/ 100/ 22.3. RR increased to 28 & PEEP increased to 15 on 100% FiO2. SpO2 & ETCO2 improved while patient lying flat. - due to continued hypoxia after changes above decision made to prone - turned prone at 22:30, will maintain for 16 hours as tolerated > plan to turn supine at 14:30 11/16/20 - f/u ABG at midnight - continue versed & fentanyl drips with RASS goal increased to -5 as patient is requiring intermittent vecuronium 10 mg IVP  Lengthy discussion with the patient's sister, Liliam, and brother via interpreter services. Explained that the patient is receiving maximum support on the ventilator and the benefits of pronation therapy. Discussed critical condition and that the patient is at a high risk for deterioration, cardiac arrest and death. All questions and concerns answered at this time.   Cheryll Cockayne Rust-Chester, AGACNP-BC Acute Care Nurse Practitioner [facility]   [phone] / [phone] Please see Amion for pager details.

## 2020-11-15 NOTE — Progress Notes (Signed)
FIO2, RR & PEEP increaed

## 2020-11-15 NOTE — Consult Note (Signed)
Pharmacy Antibiotic Note  Jordan Gibson is a 40 y.o. male admitted on 10/14/2020 with acute respiratory failure. 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 Not on HAART. CD4 count 9, viral load 1.5 million. 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 Also tested positive for Rhinovirus/enterovirus, Syphilis, and Toxoplasma. Further infectious disease work-up is pending. ID is following. Pharmacy has been consulted for Bactrim dosing for PJP. Patient is also on IV Solu-medrol. Patient is receiving Penicillin G Benzathine for Syphilis infection.  Patient was intubated 5/28 - 5/30 and then re-intubated 6/3.  6/4 pharmacy consulted for anidulafungin   Plan:  Initiate anidulafungin 200 mg IV x 1 followed by 100 mg Q24H  Continue Bactrim 34m/kg (dosed in trimethoprim) Q8H for total daily dose of 15 mg/kg/day  Lokelma 10 gram x 1 given 6/4 (K 5.5+) after discussion with provider  Monitor renal function and electrolytes (K+) closely   Height: _0  (170.2 cm) Weight: 74.9 kg (165 lb 2 oz) IBW/kg (Calculated) : 66.1  Temp (24hrs), Avg:98.7 F (37.1 C), Min:96.8 F (36 C), Max:102.56 F (39.2 C)  Recent Labs  Lab 11/11/20 0650 11/12/20 0537 11/13/20 0527 11/14/20 0629 11/15/20 0509 11/15/20 1719  WBC 7.0  6.9  --  11.6* 11.2* 12.4*  --   CREATININE 0.73 0.65 0.94 0.88 1.54* 2.33*    Estimated Creatinine Clearance: 39 mL/min (A) (by C-G formula based on SCr of 2.33 mg/dL (H)).    No Known Allergies  Antimicrobials this admission: 5/26 Azithromycin >> 5/31 5/26 Cefepime >> 5/30 5/27 Vancomycin > 5/28 5/31 Bactrim >>  6/3 Penicillin G Benzathine >> 6/4 Anidulafungin >>   Dose adjustments this admission: n/a  Microbiology results: 6/3 PJP PCR: pending 6/3 Toxoplasma PCR: pending 6/3 Histoplasma urinary Ag: pending 6/3 BAL: pending 6/1 RPR: Reactive, titer 1:4, Ab (+) 6/1 Cryptococcus Ag: (-) 5/31 Hep B/C screen: (-) 5/31 Quantiferon-TB: (-) 5/31 Histoplasma [place]:  pending 5/31 Fungitell: pending 5/28 Tracheal aspirate: Normal respiratory flora 5/28 Respiratory panel: (+) Rhinovirus / enterovirus 5/28 SARS-CoV-2 / Influenza A / B: (-) 5/28: Mycoplasma Ab: (-) 5/27 Strep pneumo urinary Ag: (-) 5/27 Legionella urinary Ag: (-) 5/27 MRSA PCR: (-) 5/26 SARS-CoV-2 / Influenza A / B: (-) 5/26 Ucx: (-) 5/26 Bcx: NG  Thank you for allowing pharmacy to be a part of this patient's care.  CDorothe Pea PharmD, BCPS Clinical Pharmacist  11/15/2020 7:35 PM

## 2020-11-16 ENCOUNTER — Inpatient Hospital Stay: Payer: Medicaid Other

## 2020-11-16 LAB — CBC WITH DIFFERENTIAL/PLATELET
Abs Immature Granulocytes: 0.11 10*3/uL — ABNORMAL HIGH (ref 0.00–0.07)
Basophils Absolute: 0 10*3/uL (ref 0.0–0.1)
Basophils Relative: 0 %
Eosinophils Absolute: 0 10*3/uL (ref 0.0–0.5)
Eosinophils Relative: 0 %
HCT: 38.8 % — ABNORMAL LOW (ref 39.0–52.0)
Hemoglobin: 13.2 g/dL (ref 13.0–17.0)
Immature Granulocytes: 1 %
Lymphocytes Relative: 4 %
Lymphs Abs: 0.7 10*3/uL (ref 0.7–4.0)
MCH: 27.8 pg (ref 26.0–34.0)
MCHC: 34 g/dL (ref 30.0–36.0)
MCV: 81.9 fL (ref 80.0–100.0)
Monocytes Absolute: 0.4 10*3/uL (ref 0.1–1.0)
Monocytes Relative: 2 %
Neutro Abs: 15.5 10*3/uL — ABNORMAL HIGH (ref 1.7–7.7)
Neutrophils Relative %: 93 %
Platelets: 362 10*3/uL (ref 150–400)
RBC: 4.74 MIL/uL (ref 4.22–5.81)
RDW: 13.6 % (ref 11.5–15.5)
Smear Review: NORMAL
WBC: 16.6 10*3/uL — ABNORMAL HIGH (ref 4.0–10.5)
nRBC: 0 % (ref 0.0–0.2)

## 2020-11-16 LAB — BLOOD GAS, ARTERIAL
Acid-base deficit: 2.3 mmol/L — ABNORMAL HIGH (ref 0.0–2.0)
Acid-base deficit: 2.9 mmol/L — ABNORMAL HIGH (ref 0.0–2.0)
Bicarbonate: 24.6 mmol/L (ref 20.0–28.0)
Bicarbonate: 25.1 mmol/L (ref 20.0–28.0)
FIO2: 1
FIO2: 1
MECHVT: 500 mL
Mechanical Rate: 28
Mechanical Rate: 28
O2 Saturation: 96.2 %
O2 Saturation: 99.6 %
Patient temperature: 37
Patient temperature: 37
pCO2 arterial: 50 mmHg — ABNORMAL HIGH (ref 32.0–48.0)
pCO2 arterial: 56 mmHg — ABNORMAL HIGH (ref 32.0–48.0)
pH, Arterial: 7.26 — ABNORMAL LOW (ref 7.350–7.450)
pH, Arterial: 7.3 — ABNORMAL LOW (ref 7.350–7.450)
pO2, Arterial: 187 mmHg — ABNORMAL HIGH (ref 83.0–108.0)
pO2, Arterial: 95 mmHg (ref 83.0–108.0)

## 2020-11-16 LAB — COMPREHENSIVE METABOLIC PANEL
ALT: 94 U/L — ABNORMAL HIGH (ref 0–44)
AST: 56 U/L — ABNORMAL HIGH (ref 15–41)
Albumin: 2.3 g/dL — ABNORMAL LOW (ref 3.5–5.0)
Alkaline Phosphatase: 150 U/L — ABNORMAL HIGH (ref 38–126)
Anion gap: 4 — ABNORMAL LOW (ref 5–15)
BUN: 36 mg/dL — ABNORMAL HIGH (ref 6–20)
CO2: 26 mmol/L (ref 22–32)
Calcium: 8.4 mg/dL — ABNORMAL LOW (ref 8.9–10.3)
Chloride: 100 mmol/L (ref 98–111)
Creatinine, Ser: 1.16 mg/dL (ref 0.61–1.24)
GFR, Estimated: >60 mL/min (ref >60)
Glucose, Bld: 133 mg/dL — ABNORMAL HIGH (ref 70–99)
Potassium: 6.6 mmol/L (ref 3.5–5.1)
Sodium: 130 mmol/L — ABNORMAL LOW (ref 135–145)
Total Bilirubin: 0.7 mg/dL (ref 0.3–1.2)
Total Protein: 6.7 g/dL (ref 6.5–8.1)

## 2020-11-16 LAB — LACTIC ACID, PLASMA
Lactic Acid, Venous: 2.5 mmol/L (ref 0.5–1.9)
Lactic Acid, Venous: 1.9 mmol/L (ref 0.5–1.9)

## 2020-11-16 LAB — MAGNESIUM: Magnesium: 2.5 mg/dL — ABNORMAL HIGH (ref 1.7–2.4)

## 2020-11-16 LAB — POTASSIUM: Potassium: 5.4 mmol/L — ABNORMAL HIGH (ref 3.5–5.1)

## 2020-11-16 LAB — GLUCOSE, CAPILLARY
Glucose-Capillary: 112 mg/dL — ABNORMAL HIGH (ref 70–99)
Glucose-Capillary: 150 mg/dL — ABNORMAL HIGH (ref 70–99)
Glucose-Capillary: 99 mg/dL (ref 70–99)
Glucose-Capillary: 80 mg/dL (ref 70–99)
Glucose-Capillary: 112 mg/dL — ABNORMAL HIGH (ref 70–99)
Glucose-Capillary: 122 mg/dL — ABNORMAL HIGH (ref 70–99)
Glucose-Capillary: 88 mg/dL (ref 70–99)
Glucose-Capillary: 114 mg/dL — ABNORMAL HIGH (ref 70–99)

## 2020-11-16 LAB — PHOSPHORUS: Phosphorus: 3.3 mg/dL (ref 2.5–4.6)

## 2020-11-16 MED ORDER — DEXTROSE 50 % IV SOLN
1.0000 | Freq: Once | INTRAVENOUS | Status: AC
  Administered 2020-11-16: 50 mL via INTRAVENOUS
  Filled 2020-11-16: qty 50

## 2020-11-16 MED ORDER — ACETAMINOPHEN 650 MG RE SUPP
650.0000 mg | Freq: Four times a day (QID) | RECTAL | Status: DC | PRN
  Administered 2020-11-16 – 2020-11-25 (×2): 650 mg via RECTAL
  Filled 2020-11-16 (×3): qty 1

## 2020-11-16 MED ORDER — SODIUM ZIRCONIUM CYCLOSILICATE 5 G PO PACK
10.0000 g | PACK | Freq: Three times a day (TID) | ORAL | Status: DC
  Administered 2020-11-16 – 2020-11-17 (×4): 10 g
  Filled 2020-11-16 (×4): qty 2

## 2020-11-16 MED ORDER — SODIUM CHLORIDE 0.9 % IV SOLN: INTRAVENOUS | Status: AC

## 2020-11-16 MED ORDER — SULFAMETHOXAZOLE-TRIMETHOPRIM 400-80 MG/5ML IV SOLN
375.0000 mg | Freq: Three times a day (TID) | INTRAVENOUS | Status: DC
  Administered 2020-11-16 – 2020-11-21 (×15): 375 mg via INTRAVENOUS
  Filled 2020-11-16 (×16): qty 23.44

## 2020-11-16 MED ORDER — SODIUM BICARBONATE 8.4 % IV SOLN
50.0000 meq | Freq: Once | INTRAVENOUS | Status: AC
  Administered 2020-11-16: 50 meq via INTRAVENOUS
  Filled 2020-11-16: qty 50

## 2020-11-16 MED ORDER — CALCIUM GLUCONATE-NACL 1-0.675 GM/50ML-% IV SOLN
1.0000 g | Freq: Once | INTRAVENOUS | Status: AC
  Administered 2020-11-16: 1000 mg via INTRAVENOUS
  Filled 2020-11-16: qty 50

## 2020-11-16 MED ORDER — INSULIN ASPART 100 UNIT/ML IV SOLN
10.0000 [IU] | Freq: Once | INTRAVENOUS | Status: AC
  Administered 2020-11-16: 10 [IU] via INTRAVENOUS
  Filled 2020-11-16: qty 0.1

## 2020-11-16 NOTE — Progress Notes (Signed)
Received report and assumed pt care. Patient alert and attempting to talk over ventilator. Pulse ox reading 80-83%. RT and Britton-Lee, NP notified prior to shift change and requested to bedside. Patient found to be voiding around foley catheter also. Foley catheter exchanged. Sedation increased and boluses given per NP at bedside. Vecuronium given to promote vent synchrony. Sister at the bedside updated on patient condition. CVC placed for pressor support. Patient O2 sats 86%. Patient placed in prone position successfully with improvement of O2 sats. Will continue to monitor.

## 2020-11-16 NOTE — Progress Notes (Signed)
Per NP, goal is to titrate peep as tolerated. Will monitor sats.  Peep decreased to 12. Patient tolerated interventions well. Updated NP B Rust-Chester.

## 2020-11-16 NOTE — Consult Note (Signed)
[facility] Consult Note: 11/16/2020   Date of Admission:  10/17/2020           Reason for Consult: AKI, hyperkalemia    Referring Provider: Flora Lipps, MD Primary Care Provider: Pcp, No   History of Presenting Illness:  Leverett Camplin is a 40 y.o. male  Patient presented to the emergency room on May 26 for shortness of breath and fever.  He required significant oxygen support of 15 L with nonrebreather mask due to low oxygen saturations. Patient was diagnosed with acute respiratory failure, hypoxia, sepsis and community-acquired pneumonia.  Admitted for further evaluation and management. 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 + syphilis test;  Negative for SARS-CoV-2 Underwent CT angiography with IV contrast exposure on May 28  At present he is prone in the bed Sedated with dex, fentanyl and versed fio2 80%; PEEP 16  Review of Systems: ROS  Not available as patient is intubated and sedated  History reviewed. No pertinent past medical history.  Social History   Tobacco Use  . Smoking status: Never Smoker  . Smokeless tobacco: Never Used  Substance Use Topics  . Alcohol use: Yes    Alcohol/week: 1.0 standard drink    Types: 1 Cans of beer per week    History reviewed. No pertinent family history.   OBJECTIVE: Blood pressure 116/61, pulse 94, temperature 100.04 F (37.8 C), resp. rate (!) 28, height 5' 7" (1.702 m), weight 71.2 kg, SpO2 98 %.  Physical Exam   Physical Exam: General:  No acute distress, laying in the bed, prone, critically ill  HEENT  ETT in place  Pulm/lungs  vent assisted  CVS/Heart  regular rhythm, no rub or gallop  Abdomen:   Soft, nontender  Extremities:  No peripheral edema  Neurologic:  Alert, oriented, able to follow commands  Skin:  No acute rashes     Lab Results Lab Results  Component Value Date   WBC 16.6 (H)  11/16/2020   HGB 13.2 11/16/2020   HCT 38.8 (L) 11/16/2020   MCV 81.9 11/16/2020   PLT 362 11/16/2020    Lab Results  Component Value Date   CREATININE 1.16 11/16/2020   BUN 36 (H) 11/16/2020   NA 130 (L) 11/16/2020   K 6.6 (HH) 11/16/2020   CL 100 11/16/2020   CO2 26 11/16/2020    Lab Results  Component Value Date   ALT 94 (H) 11/16/2020   AST 56 (H) 11/16/2020   ALKPHOS 150 (H) 11/16/2020   BILITOT 0.7 11/16/2020     Microbiology: Recent Results (from the past 240 hour(s))  Resp Panel by RT-PCR (Flu A&B, Covid) Nasopharyngeal Swab     Status: None   Collection Time: 10/23/2020  8:47 PM   Specimen: Nasopharyngeal Swab; Nasopharyngeal(NP) swabs in vial transport medium  Result Value Ref Range Status   SARS Coronavirus 2 by RT PCR NEGATIVE NEGATIVE Final    Comment: (NOTE) SARS-CoV-2 target nucleic acids are NOT DETECTED.  The SARS-CoV-2 RNA is generally detectable in upper respiratory specimens during the acute phase of infection. The lowest concentration of SARS-CoV-2 viral copies this assay can detect is 138 copies/mL. A negative result does not preclude SARS-Cov-2 infection and should not be used as the sole basis for treatment or other patient management decisions. A negative result may occur with  improper specimen collection/handling, submission of specimen other than nasopharyngeal swab, presence of viral mutation(s)  within the areas targeted by this assay, and inadequate number of viral copies(<138 copies/mL). A negative result must be combined with clinical observations, patient history, and epidemiological information. The expected result is Negative.  Fact Sheet for Patients:  EntrepreneurPulse.com.au  Fact Sheet for Healthcare Providers:  IncredibleEmployment.be  This test is no t yet approved or cleared by the [place] FDA and  has been authorized for detection and/or diagnosis of SARS-CoV-2 by FDA under an  Emergency Use Authorization (EUA). This EUA will remain  in effect (meaning this test can be used) for the duration of the COVID-19 declaration under Section 564(b)(1) of the Act, 21 U.S.C.section 360bbb-3(b)(1), unless the authorization is terminated  or revoked sooner.       Influenza A by PCR NEGATIVE NEGATIVE Final   Influenza B by PCR NEGATIVE NEGATIVE Final    Comment: (NOTE) The Xpert Xpress SARS-CoV-2/FLU/RSV plus assay is intended as an aid in the diagnosis of influenza from Nasopharyngeal swab specimens and should not be used as a sole basis for treatment. Nasal washings and aspirates are unacceptable for Xpert Xpress SARS-CoV-2/FLU/RSV testing.  Fact Sheet for Patients: EntrepreneurPulse.com.au  Fact Sheet for Healthcare Providers: IncredibleEmployment.be  This test is not yet approved or cleared by the [place] FDA and has been authorized for detection and/or diagnosis of SARS-CoV-2 by FDA under an Emergency Use Authorization (EUA). This EUA will remain in effect (meaning this test can be used) for the duration of the COVID-19 declaration under Section 564(b)(1) of the Act, 21 U.S.C. section 360bbb-3(b)(1), unless the authorization is terminated or revoked.  Performed at [facility], [address] [place], [place] [address]   Culture, blood (Routine x 2)     Status: None   Collection Time: 10/20/2020  8:53 PM   Specimen: BLOOD  Result Value Ref Range Status   Specimen Description BLOOD RIGHT ANTECUBITAL  Final   Special Requests   Final    BOTTLES DRAWN AEROBIC AND ANAEROBIC Blood Culture adequate volume   Culture   Final    NO GROWTH 5 DAYS Performed at [facility], [address], [place], [place] [address]    Report Status 11/11/2020 FINAL  Final  Urine culture     Status: None   Collection Time: 11/07/2020  8:53 PM   Specimen: In/Out Cath Urine  Result Value Ref Range Status   Specimen  Description   Final    IN/OUT CATH URINE Performed at [facility], [address], [place], [place] [address]    Special Requests   Final    NONE Performed at [facility], [address] [place], [place] [address]    Culture   Final    NO GROWTH Performed at [facility], [place] [address] [place], [place] [address]    Report Status 11/08/2020 FINAL  Final  Culture, blood (Routine x 2)     Status: None   Collection Time: 11/11/2020  8:58 PM   Specimen: BLOOD  Result Value Ref Range Status   Specimen Description BLOOD LEFT ANTECUBITAL  Final   Special Requests   Final    BOTTLES DRAWN AEROBIC AND ANAEROBIC Blood Culture adequate volume   Culture   Final    NO GROWTH 5 DAYS Performed at [facility], [address] [place],  [address]    Report Status 11/11/2020 FINAL  Final  MRSA PCR Screening     Status: None   Collection Time: 11/07/20 11:15 AM  Specimen: Nasal Mucosa; Nasopharyngeal  Result Value Ref Range Status   MRSA by PCR NEGATIVE NEGATIVE Final    Comment:        The GeneXpert MRSA Assay (FDA approved for NASAL specimens only), is one component of a comprehensive MRSA colonization surveillance program. It is not intended to diagnose MRSA infection nor to guide or monitor treatment for MRSA infections. Performed at [facility], [place], [place] [address]   Expectorated Sputum Assessment w Gram Stain, Rflx to Resp Cult     Status: None   Collection Time: 11/07/20 11:55 AM   Specimen: Expectorated Sputum  Result Value Ref Range Status   Specimen Description EXPECTORATED SPUTUM  Final   Special Requests NONE  Final   Sputum evaluation   Final    Sputum specimen not acceptable for testing.  Please recollect.   C/JOHN [facility] AT 1227 11/07/20.PMF Performed at [facility], [address] [place], [place] [address]    Report Status 11/07/2020 FINAL  Final  Resp Panel by RT-PCR (Flu  A&B, Covid) Nasopharyngeal Swab     Status: None   Collection Time: 11/08/20  3:08 AM   Specimen: Nasopharyngeal Swab; Nasopharyngeal(NP) swabs in vial transport medium  Result Value Ref Range Status   SARS Coronavirus 2 by RT PCR NEGATIVE NEGATIVE Final    Comment: (NOTE) SARS-CoV-2 target nucleic acids are NOT DETECTED.  The SARS-CoV-2 RNA is generally detectable in upper respiratory specimens during the acute phase of infection. The lowest concentration of SARS-CoV-2 viral copies this assay can detect is 138 copies/mL. A negative result does not preclude SARS-Cov-2 infection and should not be used as the sole basis for treatment or other patient management decisions. A negative result may occur with  improper specimen collection/handling, submission of specimen other than nasopharyngeal swab, presence of viral mutation(s) within the areas targeted by this assay, and inadequate number of viral copies(<138 copies/mL). A negative result must be combined with clinical observations, patient history, and epidemiological information. The expected result is Negative.  Fact Sheet for Patients:  EntrepreneurPulse.com.au  Fact Sheet for Healthcare Providers:  IncredibleEmployment.be  This test is no t yet approved or cleared by the [place] FDA and  has been authorized for detection and/or diagnosis of SARS-CoV-2 by FDA under an Emergency Use Authorization (EUA). This EUA will remain  in effect (meaning this test can be used) for the duration of the COVID-19 declaration under Section 564(b)(1) of the Act, 21 U.S.C.section 360bbb-3(b)(1), unless the authorization is terminated  or revoked sooner.       Influenza A by PCR NEGATIVE NEGATIVE Final   Influenza B by PCR NEGATIVE NEGATIVE Final    Comment: (NOTE) The Xpert Xpress SARS-CoV-2/FLU/RSV plus assay is intended as an aid in the diagnosis of influenza from Nasopharyngeal swab specimens  and should not be used as a sole basis for treatment. Nasal washings and aspirates are unacceptable for Xpert Xpress SARS-CoV-2/FLU/RSV testing.  Fact Sheet for Patients: EntrepreneurPulse.com.au  Fact Sheet for Healthcare Providers: IncredibleEmployment.be  This test is not yet approved or cleared by the [place] FDA and has been authorized for detection and/or diagnosis of SARS-CoV-2 by FDA under an Emergency Use Authorization (EUA). This EUA will remain in effect (meaning this test can be used) for the duration of the COVID-19 declaration under Section 564(b)(1) of the Act, 21 U.S.C. section 360bbb-3(b)(1), unless the authorization is terminated or revoked.  Performed at [facility], [place], [place]  [address]   Culture, Respiratory w Gram Stain     Status: None   Collection Time: 11/08/20  4:37 AM   Specimen: Tracheal Aspirate; Respiratory  Result Value Ref Range Status   Specimen Description   Final    TRACHEAL ASPIRATE Performed at [facility], [address], [place], [place] [address]    Special Requests   Final    NONE Performed at [facility], [address] [place], [place] [address]    Gram Stain   Final    MODERATE WBC PRESENT,BOTH PMN AND MONONUCLEAR FEW GRAM POSITIVE COCCI IN CLUSTERS RARE GRAM POSITIVE RODS    Culture   Final    Normal respiratory flora-no Staph aureus or Pseudomonas seen Performed at [facility], [place] [address] [place], [place] [address]    Report Status 11/10/2020 FINAL  Final  Respiratory (~20 pathogens) panel by PCR     Status: Abnormal   Collection Time: 11/08/20 12:50 PM   Specimen: Nasopharyngeal Swab; Respiratory  Result Value Ref Range Status   Adenovirus NOT DETECTED NOT DETECTED Final   Coronavirus 229E NOT DETECTED NOT DETECTED Final    Comment: (NOTE) The Coronavirus on the Respiratory Panel, DOES NOT test for the novel   Coronavirus (2019 nCoV)    Coronavirus HKU1 NOT DETECTED NOT DETECTED Final   Coronavirus NL63 NOT DETECTED NOT DETECTED Final   Coronavirus OC43 NOT DETECTED NOT DETECTED Final   Metapneumovirus NOT DETECTED NOT DETECTED Final   Rhinovirus / Enterovirus DETECTED (A) NOT DETECTED Final   Influenza A NOT DETECTED NOT DETECTED Final   Influenza B NOT DETECTED NOT DETECTED Final   Parainfluenza Virus 1 NOT DETECTED NOT DETECTED Final   Parainfluenza Virus 2 NOT DETECTED NOT DETECTED Final   Parainfluenza Virus 3 NOT DETECTED NOT DETECTED Final   Parainfluenza Virus 4 NOT DETECTED NOT DETECTED Final   Respiratory Syncytial Virus NOT DETECTED NOT DETECTED Final   Bordetella pertussis NOT DETECTED NOT DETECTED Final   Bordetella Parapertussis NOT DETECTED NOT DETECTED Final   Chlamydophila pneumoniae NOT DETECTED NOT DETECTED Final   Mycoplasma pneumoniae NOT DETECTED NOT DETECTED Final    Comment: Performed at [facility], [place], [place], [place] [address]  Culture, Respiratory w Gram Stain     Status: None   Collection Time: 11/08/20 12:55 PM   Specimen: Tracheal Aspirate; Respiratory  Result Value Ref Range Status   Specimen Description   Final    TRACHEAL ASPIRATE Performed at [facility], [address] [place], [place] [address]    Special Requests   Final    NONE Performed at [facility], [address] [place], [place] [address]    Gram Stain   Final    FEW WBC PRESENT,BOTH PMN AND MONONUCLEAR NO ORGANISMS SEEN    Culture   Final    Normal respiratory flora-no Staph aureus or Pseudomonas seen Performed at [place] [address], [place], [place] [address]    Report Status 11/12/2020 FINAL  Final  Culture, BAL-quantitative w Gram Stain     Status: None (Preliminary result)   Collection Time: 11/14/20  2:02 PM   Specimen: Bronchoalveolar Lavage; Respiratory  Result Value Ref Range Status   Specimen Description   Final     BRONCHIAL ALVEOLAR LAVAGE Performed at [facility], [address] [place], [place] [address]    Special Requests   Final    NONE Performed at  [place] [place], [place] [address]    Gram Stain   Final    ABUNDANT WBC PRESENT, PREDOMINANTLY MONONUCLEAR ABUNDANT GRAM POSITIVE COCCI IN CLUSTERS FEW GRAM POSITIVE COCCI IN PAIRS AND CHAINS RARE GRAM NEGATIVE RODS Performed at [facility], [place] [address], [place], [place] [address]    Culture PENDING  Incomplete   Report Status PENDING  Incomplete    Medications: Scheduled Meds: . chlorhexidine gluconate (MEDLINE KIT)  15 mL Mouth Rinse BID  . Chlorhexidine Gluconate Cloth  6 each Topical Daily  . docusate  100 mg Per Tube BID  . enoxaparin (LOVENOX) injection  40 mg Subcutaneous Q24H  . feeding supplement (PIVOT 1.5 CAL)  1,000 mL Per Tube Q24H  . feeding supplement (PROSource TF)  45 mL Per Tube Daily  . free water  20 mL Per Tube Q4H  . insulin aspart  0-20 Units Subcutaneous Q4H  . mouth rinse  15 mL Mouth Rinse 10 times per day  . methylPREDNISolone (SOLU-MEDROL) injection  40 mg Intravenous Q12H  . pantoprazole (PROTONIX) IV  40 mg Intravenous Daily  . penicillin g benzathine (BICILLIN-LA) IM  2.4 Million Units Intramuscular Weekly  . polyethylene glycol  17 g Per Tube Daily  . sodium zirconium cyclosilicate  10 g Per Tube TID   Continuous Infusions: . sodium chloride 10 mL/hr at 11/15/20 1941  . sodium chloride Stopped (11/16/20 0449)  . sodium chloride 100 mL/hr at 11/16/20 0600  . anidulafungin    . dexmedetomidine (PRECEDEX) IV infusion 1.2 mcg/kg/hr (11/16/20 5956)  . fentaNYL infusion INTRAVENOUS 375 mcg/hr (11/16/20 0600)  . midazolam 10 mg/hr (11/16/20 0600)  . norepinephrine (LEVOPHED) Adult infusion 6 mcg/min (11/16/20 0600)  . sulfamethoxazole-trimethoprim 348.7 mL/hr at 11/16/20 0600   PRN Meds:.acetaminophen, acetaminophen, midazolam, ondansetron **OR**  ondansetron (ZOFRAN) IV, vecuronium  No Known Allergies  Urinalysis: No results for input(s): COLORURINE, LABSPEC, PHURINE, GLUCOSEU, HGBUR, BILIRUBINUR, KETONESUR, PROTEINUR, UROBILINOGEN, NITRITE, LEUKOCYTESUR in the last 72 hours.  Invalid input(s): APPERANCEUR    Imaging: DG Chest 1 View  Result Date: 11/15/2020 CLINICAL DATA:  Status post central line placement. EXAM: CHEST  1 VIEW COMPARISON:  Chest radiograph dated 11/14/2020. FINDINGS: There has been interval placement of a left internal jugular central venous catheter with tip overlying the confluence of the brachiocephalic vein and superior vena cava. An endotracheal tube terminates in the midthoracic trachea. An enteric tube terminates in the stomach. The heart size is normal. Severe diffuse bilateral airspace opacities appear increased. There is no pleural effusion or pneumothorax. IMPRESSION: 1. Left internal jugular central venous catheter tip overlying the confluence of the brachiocephalic vein and superior vena cava. No pneumothorax. 2. Increased severe diffuse bilateral airspace opacities, likely representing pneumonia. Electronically Signed   By: Zerita Boers M.D.   On: 11/15/2020 20:48   DG Abd 1 View  Result Date: 11/14/2020 CLINICAL DATA:  Orogastric tube placement EXAM: ABDOMEN - 1 VIEW COMPARISON:  Portable exam 1234 hours compared to by 01/31/2021 FINDINGS: Tip of orogastric tube projects over gastric antrum. Nonobstructive bowel gas pattern. No bowel dilatation or bowel wall thickening. IMPRESSION: Tip of orogastric tube projects over gastric antrum. Electronically Signed   By: Lavonia Dana M.D.   On: 11/14/2020 13:02   Portable Chest x-ray  Result Date: 11/14/2020 CLINICAL DATA:  Endotracheal tube placement EXAM: PORTABLE CHEST 1 VIEW COMPARISON:  Portable exam 1233 hours compared to 11/12/2020 FINDINGS: Tip of endotracheal tube projects 3.7 cm above carina. Nasogastric  tube extends into stomach. Normal heart size,  mediastinal contours, and pulmonary vascularity. Diffuse BILATERAL pulmonary infiltrates, probably unchanged when accounting for better degree of pulmonary inflation. No pleural effusion or pneumothorax. IMPRESSION: Persistent diffuse BILATERAL pulmonary infiltrates question multifocal pneumonia. Electronically Signed   By: Lavonia Dana M.D.   On: 11/14/2020 13:02      Assessment/Plan:  Osborne Serio is a 40 y.o. male with no known medical problems   was admitted on 11/08/2020 for :  Edema [R60.9] CAP (community acquired pneumonia) [J18.9] Acute respiratory failure with hypoxia ([place]) [J96.01] Sepsis ([place]) [A41.9]   Diagnosed with HIV + this admission + rhinovirus/enterovirus + syphilis + toxoplasmosis Ab   #Acute kidney injury Baseline creatinine of 0.88 on June 3.  Creatinine peaked at 2.33 on June 4 and has improved to 1.16 today.   Good UOP noted Lab Results  Component Value Date   CREATININE 1.16 11/16/2020   CREATININE 2.33 (H) 11/15/2020   CREATININE 1.54 (H) 11/15/2020   06/04 0701 - 06/05 0700 In: 7087.1 [I.V.:2307.3; IV Piggyback:4779.8] Out: 2325 [Urine:2325]   Electrolytes and Volume status are acceptable No acute indication for Dialysis at present   #Hyperkalemia Likely secondary to IV Bactrim Agree with shifting measures and maintaining patient on Lokelma  # Acute resp failure  Vent supported Getting treatment for PJP pneumonia   Mamie Hundertmark Candiss Norse 11/16/20

## 2020-11-16 NOTE — Progress Notes (Signed)
Critical Potassium of 6.6 received. Britton-Lee, NP notified. Orders received.

## 2020-11-16 NOTE — Progress Notes (Signed)
Chart reviewed Discussed with care team Acute hypoxic resp failure B/l pulmonary infiltrate Beta D glucan > 500 Fungal antibodies neg Cryptococcus neg toxo igG positive RPR /TPA positive Pt is on IV bactrim and solumedrol for Presumed PCP Has hyperkalemia and got lokelma and K is now 5.4 Afternoon dose of bactrim held Discussed with nephrologist - can continue bactrim IV with lokelma If hyperkalemia persist then may have to change to clindamycin + primaquine Can DC anidulafungin

## 2020-11-16 NOTE — Progress Notes (Signed)
  FIO2 to .90

## 2020-11-16 NOTE — Progress Notes (Addendum)
NAME:  Jordan Gibson, MRN:  [id number], DOB:  05/03/1981, LOS: 10 ADMISSION DATE:  10/24/2020, INITIAL CONSULTATION DATE: 11/07/2020  REFERRING MD: Dr. Roderic Palau, CHIEF COMPLAINT: Shortness of breath, cough, fever  Brief Patient Description  Jordan Gibson is a 40 y.o. Male with no significant past medical history who presented to [facility] ED on 11/07/20 with a 1 week history of shortness of breath, nonproductive cough, and fever. He was admitted to Acute Hypoxic Respiratory Failure and Sepsis in the setting of suspected Community Acquired Pneumonia.   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 Not on HAART. CD4 count 9, viral load 1.5 million. 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 Also tested positive for Rhinovirus/enterovirus, Syphilis, and Toxoplasma now complicated by ARDS requiring mechanical ventilation support.  Pertinent  Medical History  No significant past medical history  Significant Hospital Events: Including procedures, antibiotic start and stop dates in addition to other pertinent events   10/14/2020: Presented to ED, to be admitted to stepdown unit by hospitalist 11/07/2020: Increasing FiO2 requirements and worsening respiratory status, PCCM consulted due to high risk for deterioration and need for intubation. ABX broadened to Cefepime and Vancomycin, along with Azithromycin 11/08/20- patient with respiratory failure s/p ETT overnight. Vancomycin dcd today MRSA pcr negative.  11/09/20- Pt extubated to HFNC _0  AND 45%.  Patient with severe ARDS. I met with family and reviewed care plan. +Rhino/ENtero virus 11/10/20- patient improved, has been extubated to HFNC.  Tranferring to step down unit, signed out to [facility]. 11/12/20- FiO2 increased to 75%, dcd IV NS at 50/h, have aded lasix 20 bid, decreased prednisone to 40 once daily, stopped po vicodin.  Metaneb ordered BID with RT for recruitment.  Repeat CXR today 11/13/20: Transfferd back to ICU  Due to severe acute hypoxic respiratory  failure 11/14/20: Failed HHFN and BiPAP requiring emergent re- intubation. s/p bedside Bronchoscopy 11/15/20: Patient continues to be hypoxic after increasing FiO2 from 50% to 100% & PEEP from 10 to 12. ABG post these changes revealed a PF ratio of 100 & respiratory acidosis. Proned  Cultures:  6/3: PJP PCR: pending 6/3: Toxoplasma PCR: pending 6/3: Histoplasma urinary Ag: pending 6/3: BAL: pending 6/1: RPR: Reactive, titer 1:4, Ab (+) 6/1: Cryptococcus Ag: (-) 5/31: Hep B/C screen: (-) 5/31: Quantiferon-TB: (-) 5/31: Histoplasma Gal[place]an: pending 5/31: Fungitell: pending 5/28:Mycoplasma pneumonia ab >>negative 5/28 Tracheal aspirate: Normal respiratory flora 5/28 Respiratory panel: (+) Rhinovirus / enterovirus 5/26: SARS-CoV-2 PCR>> negative 5/26: Influenza PCR>> negative 5/26: Blood culture x2>>No growth thus far 5/26: Urine Cx>>no growth 5/27: MRSA PCR>> negative 5/27:Strep pneumo urinary antigen>>negative 5/27:Legionella urinary antigen>>negative   Antimicrobials:  5/26 Azithromycin >> 5/31 5/26 Cefepime >> 5/30 5/27 Vancomycin > 5/28 5/31 Bactrim >>  6/3 Penicillin G Benzathine >> 6/4 Anidulafungin >>   Interim History / Subjective:  - Worsening hypoxia overnight - turned prone at 22:30, will maintain for 16 hours as tolerated > plan to turn supine at 14:30 11/16/20 - f/u ABG at midnight - continue versed & fentanyl drips with intermittent vecuronium 10 mg IVP - Remains on Levo to keep MAP>65 mm Hg - Creatine improved this morning, hyperkalemia due to bactrim? Nephro following  - Hyponatremia improved  OBJECTIVE   Blood pressure 116/61, pulse 94, temperature 100.04 F (37.8 C), resp. rate (!) 28, height 5' 7" (1.702 m), weight 71.2 kg, SpO2 98 %. CVP:  [4 mmHg-28 mmHg] 13 mmHg  Vent Mode: PRVC FiO2 (%):  [50 %-100 %] 90 % Set Rate:  [20 bmp-28 bmp] 28 bmp Vt  Set:  [500 mL] 500 mL PEEP:  [10 cmH20-16 cmH20] 16 cmH20 Plateau Pressure:  [22 cmH20-27 cmH20] 27 cmH20    Intake/Output Summary (Last 24 hours) at 11/16/2020 0737 Last data filed at 11/16/2020 0600 Gross per 24 hour  Intake 7087.08 ml  Output 2325 ml  Net 4762.08 ml   Filed Weights   11/07/20 2000 11/11/20 0536 11/16/20 0500  Weight: 80.1 kg 74.9 kg 71.2 kg   Physical Examination: GENERAL:40 year-old critically ill patient lying in the bed intubated, mechanically ventillated and sedated in prone position.  EYES: Pupils equal, round, reactive to light and accommodation. No scleral icterus. Extraocular muscles intact.  HEENT: Head atraumatic, normocephalic. Oropharynx and nasopharynx clear.  NECK:  Supple, no jugular venous distention. No thyroid enlargement, no tenderness.  LUNGS: Normal breath sounds bilaterally, no wheezing, rales,rhonchi or crepitation. No use of accessory muscles of respiration.  CARDIOVASCULAR: S1, S2 normal. No murmurs, rubs, or gallops.  ABDOMEN: Not assessed due to being in prone position EXTREMITIES: No pedal edema, cyanosis, or clubbing.  NEUROLOGIC: Cranial nerves II through XII are intact. Muscle strength not tested in all extremities received paralytic last night. Sensation and Gait deffered. PSYCHIATRIC: Unable to assess SKIN: No obvious rash, lesion, or ulcer.   Labs/imaging that I havepersonally reviewed  (right click and "Reselect all SmartList Selections" daily)    Labs   CBC: Recent Labs  Lab 11/11/20 0650 11/13/20 0527 11/14/20 0629 11/15/20 0509 11/16/20 0437  WBC 7.0  6.9 11.6* 11.2* 12.4* 16.6*  NEUTROABS 6.0  --   --  11.2* 15.5*  HGB 12.7*  12.5* 12.7* 14.2 14.3 13.2  HCT 37.6*  39.8 38.2* 41.4 42.8 38.8*  MCV 82.8  86 81.4 81.0 81.5 81.9  PLT 307  293 376 436* 407* 159    Basic Metabolic Panel: Recent Labs  Lab 11/11/20 0650 11/12/20 0537 11/13/20 0527 11/14/20 0629 11/14/20 1732 11/15/20 0509 11/15/20 1719 11/16/20 0437  NA 133* 129* 125* 125*  --  124*  --  130*  K 3.7 4.3 4.3 4.3  --  5.4* 5.5* 6.6*  CL 98 99 94*  95*  --  93*  --  100  CO2 _0 17*  --  21*  --  26  GLUCOSE 88 135* 181* 142*  --  235*  --  133*  BUN 22* _1 --  43*  --  36*  CREATININE 0.73 0.65 0.94 0.88  --  1.54* 2.33* 1.16  CALCIUM 8.6* 8.4* 8.6* 9.0  --  9.1  --  8.4*  MG 2.2  --   --   --  2.7* 2.8* 2.4 2.5*  PHOS 3.7  --   --   --  7.6* 6.5* 4.1 3.3   GFR: Estimated Creatinine Clearance: 78.4 mL/min (by C-G formula based on SCr of 1.16 mg/dL). Recent Labs  Lab 11/13/20 0527 11/14/20 0629 11/15/20 0509 11/15/20 1935 11/15/20 2248 11/16/20 0437 11/16/20 0441  WBC 11.6* 11.2* 12.4*  --   --  16.6*  --   LATICACIDVEN  --   --   --  3.3* 2.5*  --  1.9    Liver Function Tests: Recent Labs  Lab 11/10/20 0353 11/15/20 0509 11/16/20 0437  AST  --  28 56*  ALT  --  79* 94*  ALKPHOS  --  150* 150*  BILITOT  --  0.5 0.7  PROT  --  7.4 6.7  ALBUMIN 2.0* 2.7* 2.3*   No results  for input(s): LIPASE, AMYLASE in the last 168 hours. No results for input(s): AMMONIA in the last 168 hours.  ABG    Component Value Date/Time   PHART 7.30 (L) 11/16/2020 0441   PCO2ART 50 (H) 11/16/2020 0441   PO2ART 187 (H) 11/16/2020 0441   HCO3 24.6 11/16/2020 0441   ACIDBASEDEF 2.3 (H) 11/16/2020 0441   O2SAT 99.6 11/16/2020 0441     Coagulation Profile: No results for input(s): INR, PROTIME in the last 168 hours.  Cardiac Enzymes: No results for input(s): CKTOTAL, CKMB, CKMBINDEX, TROPONINI in the last 168 hours.  HbA1C: Hgb A1c MFr Bld  Date/Time Value Ref Range Status  11/12/2020 05:37 AM 5.9 (H) 4.8 - 5.6 % Final    Comment:    (NOTE)         Prediabetes: 5.7 - 6.4         Diabetes: >6.4         Glycemic control for adults with diabetes: <7.0     CBG: Recent Labs  Lab 11/15/20 1615 11/15/20 1931 11/15/20 2338 11/16/20 0357 11/16/20 0609  GLUCAP 275* 220* 144* 112* 150*    Allergies No Known Allergies   Home Medications  Prior to Admission medications   Medication Sig Start Date End Date  Taking? Authorizing Provider  acetaminophen (TYLENOL) 325 MG tablet Take 650 mg by mouth every 6 (six) hours as needed.   Yes [provider]  albuterol (VENTOLIN HFA) 108 (90 Base) MCG/ACT inhaler Inhale 2 puffs into the lungs every 4 (four) hours as needed. 10/31/20  Yes [provider]  fluticasone-salmeterol (ADVAIR) 100-50 MCG/ACT AEPB Inhale 1 puff into the lungs 2 (two) times daily. 11/03/20  Yes [provider]  Vitamin D, Ergocalciferol, (DRISDOL) 1.25 MG (50000 UNIT) CAPS capsule Take 1 capsule by mouth once a week. 10/31/20  Yes [provider]   Scheduled Meds: . chlorhexidine gluconate (MEDLINE KIT)  15 mL Mouth Rinse BID  . Chlorhexidine Gluconate Cloth  6 each Topical Daily  . docusate  100 mg Per Tube BID  . enoxaparin (LOVENOX) injection  40 mg Subcutaneous Q24H  . feeding supplement (PIVOT 1.5 CAL)  1,000 mL Per Tube Q24H  . feeding supplement (PROSource TF)  45 mL Per Tube Daily  . free water  20 mL Per Tube Q4H  . insulin aspart  0-20 Units Subcutaneous Q4H  . mouth rinse  15 mL Mouth Rinse 10 times per day  . methylPREDNISolone (SOLU-MEDROL) injection  40 mg Intravenous Q12H  . pantoprazole (PROTONIX) IV  40 mg Intravenous Daily  . penicillin g benzathine (BICILLIN-LA) IM  2.4 Million Units Intramuscular Weekly  . polyethylene glycol  17 g Per Tube Daily  . sodium zirconium cyclosilicate  10 g Per Tube TID   Continuous Infusions: . sodium chloride 10 mL/hr at 11/15/20 1941  . sodium chloride Stopped (11/16/20 0449)  . sodium chloride 100 mL/hr at 11/16/20 0600  . anidulafungin    . dexmedetomidine (PRECEDEX) IV infusion 1.2 mcg/kg/hr (11/16/20 1610)  . fentaNYL infusion INTRAVENOUS 375 mcg/hr (11/16/20 0600)  . midazolam 10 mg/hr (11/16/20 0600)  . norepinephrine (LEVOPHED) Adult infusion 6 mcg/min (11/16/20 0600)  . 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 -Initiate Lung  protective strategies: PRVC  6-8 mL/kg, titrate down if peak pressure>30 mm Hg, -Will maintain pH 7.30-7.45 allowing for permissive hypercapnia if appropriate -Wean PEEP & FiO2 as tolerated, maintain SpO2 >  Between 99-95%, Pa02 between 55-80% -Head of bed elevated 30 degrees, VAP protocol in place -Plateau pressures less than 30 cm H20  -Prone ventilation: turned prone at 22:30, will maintain for 16 hours as tolerated > plan to turn supine at 14:30 11/16/20 -Intermittent chest x-ray & ABG PRN -Ensure adequate pulmonary hygiene  -PAD protocol in place: continue Fentanyl, Precedex and Versed drip, Vecuronium PRN for vent synchrony.  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  Creatinine peaked at 2.33 on June 4 and has improved to 1.16 today.   Hyponatremia Improved Hyperkalemia likely secondary to Bactrim -Monitor I&O's / urinary output -Follow BMP -Ensure adequate renal perfusion -Avoid nephrotoxic agents as able -Continue Lokelma for hyperkalemia -Replace electrolytes as indicated -Nephrology input, appreciated   Newly diagnosed HIV/AIDS -CD4 is 7  and VL 1.5 million copies -Will start HAART after 7-10 days of PCP treatment per ID -ID following, input appreciated   Best practice (right click and "Reselect all SmartList Selections" daily)  Diet:  Tube Feed  Pain/Anxiety/Delirium protocol (if indicated): Yes (RASS goal -1) VAP protocol (if indicated): Yes DVT prophylaxis: Contraindicated GI prophylaxis: PPI Glucose control:  SSI Yes Central venous access:  Yes, and it is still needed Arterial line:  N/A Foley:  Yes, and it is still needed Mobility:  bed rest  PT consulted: N/A Last date of multidisciplinary goals of care discussion [6/5] Code Status:  full code Disposition: ICU  Critical care time:45 minutes       Rufina Falco, DNP, FNP-C, AGACNP-BC Acute Care Nurse Practitioner  [facility] Pager: [phone] [place] [facility]

## 2020-11-17 LAB — COMPREHENSIVE METABOLIC PANEL
ALT: 66 U/L — ABNORMAL HIGH (ref 0–44)
AST: 36 U/L (ref 15–41)
Albumin: 1.9 g/dL — ABNORMAL LOW (ref 3.5–5.0)
Alkaline Phosphatase: 114 U/L (ref 38–126)
Anion gap: 5 (ref 5–15)
BUN: 22 mg/dL — ABNORMAL HIGH (ref 6–20)
CO2: 25 mmol/L (ref 22–32)
Calcium: 8.1 mg/dL — ABNORMAL LOW (ref 8.9–10.3)
Chloride: 97 mmol/L — ABNORMAL LOW (ref 98–111)
Creatinine, Ser: 0.73 mg/dL (ref 0.61–1.24)
GFR, Estimated: >60 mL/min (ref >60)
Glucose, Bld: 237 mg/dL — ABNORMAL HIGH (ref 70–99)
Potassium: 5 mmol/L (ref 3.5–5.1)
Sodium: 127 mmol/L — ABNORMAL LOW (ref 135–145)
Total Bilirubin: 0.5 mg/dL (ref 0.3–1.2)
Total Protein: 5.6 g/dL — ABNORMAL LOW (ref 6.5–8.1)

## 2020-11-17 LAB — CULTURE, BAL-QUANTITATIVE W GRAM STAIN: Culture: >=100000 — AB

## 2020-11-17 LAB — GLUCOSE, CAPILLARY
Glucose-Capillary: 191 mg/dL — ABNORMAL HIGH (ref 70–99)
Glucose-Capillary: 192 mg/dL — ABNORMAL HIGH (ref 70–99)
Glucose-Capillary: 202 mg/dL — ABNORMAL HIGH (ref 70–99)
Glucose-Capillary: 204 mg/dL — ABNORMAL HIGH (ref 70–99)
Glucose-Capillary: 239 mg/dL — ABNORMAL HIGH (ref 70–99)

## 2020-11-17 LAB — BLOOD GAS, ARTERIAL
Acid-base deficit: 5.4 mmol/L — ABNORMAL HIGH (ref 0.0–2.0)
Bicarbonate: 20 mmol/L (ref 20.0–28.0)
FIO2: 0.5
O2 Saturation: 97.1 %
Patient temperature: 37
pCO2 arterial: 38 mmHg (ref 32.0–48.0)
pH, Arterial: 7.33 — ABNORMAL LOW (ref 7.350–7.450)
pO2, Arterial: 98 mmHg (ref 83.0–108.0)

## 2020-11-17 LAB — CBC
HCT: 32.9 % — ABNORMAL LOW (ref 39.0–52.0)
Hemoglobin: 10.6 g/dL — ABNORMAL LOW (ref 13.0–17.0)
MCH: 27.2 pg (ref 26.0–34.0)
MCHC: 32.2 g/dL (ref 30.0–36.0)
MCV: 84.6 fL (ref 80.0–100.0)
Platelets: 267 10*3/uL (ref 150–400)
RBC: 3.89 MIL/uL — ABNORMAL LOW (ref 4.22–5.81)
RDW: 13.8 % (ref 11.5–15.5)
WBC: 6.8 10*3/uL (ref 4.0–10.5)
nRBC: 0 % (ref 0.0–0.2)

## 2020-11-17 LAB — TSH: TSH: 2.027 u[IU]/mL (ref 0.350–4.500)

## 2020-11-17 LAB — PHOSPHORUS: Phosphorus: 2.8 mg/dL (ref 2.5–4.6)

## 2020-11-17 LAB — MAGNESIUM: Magnesium: 2.1 mg/dL (ref 1.7–2.4)

## 2020-11-17 MED ORDER — VITAL 1.5 CAL PO LIQD
1000.0000 mL | ORAL | Status: DC
  Administered 2020-11-17 – 2020-11-24 (×5): 1000 mL

## 2020-11-17 MED ORDER — CHLORHEXIDINE GLUCONATE CLOTH 2 % EX PADS
6.0000 | MEDICATED_PAD | Freq: Every morning | CUTANEOUS | Status: DC
  Administered 2020-11-18 – 2020-12-02 (×14): 6 via TOPICAL

## 2020-11-17 MED ORDER — INSULIN ASPART 100 UNIT/ML IJ SOLN
3.0000 [IU] | INTRAMUSCULAR | Status: DC
  Administered 2020-11-18 – 2020-11-30 (×56): 3 [IU] via SUBCUTANEOUS
  Filled 2020-11-17 (×53): qty 1

## 2020-11-17 MED ORDER — CHLORHEXIDINE GLUCONATE 0.12% ORAL RINSE (MEDLINE KIT)
15.0000 mL | Freq: Two times a day (BID) | OROMUCOSAL | Status: DC
  Administered 2020-11-18 – 2020-11-28 (×23): 15 mL via OROMUCOSAL

## 2020-11-17 MED ORDER — PROSOURCE TF PO LIQD
45.0000 mL | Freq: Two times a day (BID) | ORAL | Status: DC
  Administered 2020-11-17 – 2020-11-24 (×14): 45 mL
  Filled 2020-11-17 (×15): qty 45

## 2020-11-17 MED ORDER — SODIUM ZIRCONIUM CYCLOSILICATE 5 G PO PACK
10.0000 g | PACK | Freq: Three times a day (TID) | ORAL | Status: DC
  Administered 2020-11-17 – 2020-11-18 (×3): 10 g
  Filled 2020-11-17 (×3): qty 2

## 2020-11-17 MED ORDER — SODIUM CHLORIDE 0.9 % IV SOLN: INTRAVENOUS | Status: DC

## 2020-11-17 NOTE — Progress Notes (Signed)
Peep reduced to 10 per Britton-Lee, NP.

## 2020-11-17 NOTE — Progress Notes (Signed)
[facility], [place] 11/17/20  Subjective:   Hospital day # 11   cvs: no pressors, regular rhythm Pulm: vent assited; fio2 50% Gi: TF @ 45 cc/hr via OGT Renal: 06/05 0701 - 06/06 0700 In: 5863 [I.V.:2159.8; TG/PQ:9826.4; IV Piggyback:724.4] Out: 3055 [Urine:3055] Lab Results  Component Value Date   CREATININE 0.73 11/17/2020   CREATININE 1.16 11/16/2020   CREATININE 2.33 (H) 11/15/2020     Objective:  Vital signs in last 24 hours:  Temp:  [97.34 F (36.3 C)-102.02 F (38.9 C)] 98.2 F (36.8 C) (06/06 0802) Pulse Rate:  [68-97] 68 (06/06 0740) Resp:  [22-28] 28 (06/06 0740) BP: (59-139)/(40-92) 109/62 (06/06 0630) SpO2:  [94 %-100 %] 97 % (06/06 0740) FiO2 (%):  [50 %-70 %] 50 % (06/06 0740) Weight:  [71.4 kg] 71.4 kg (06/06 0500)  Weight change: 0.2 kg Filed Weights   11/11/20 0536 11/16/20 0500 11/17/20 0500  Weight: 74.9 kg 71.2 kg 71.4 kg    Intake/Output:    Intake/Output Summary (Last 24 hours) at 11/17/2020 0923 Last data filed at 11/17/2020 0801 Gross per 24 hour  Intake 5862.99 ml  Output 3230 ml  Net 2632.99 ml    Physical Exam: General:  laying in the bed, critically ill  HEENT  ETT in place, OGT in place  Pulm/lungs  vent assisted  CVS/Heart  regular rhythm, no rub or gallop  Abdomen:   Soft, nontender  Extremities:  No peripheral edema  Neurologic: sedated  Skin:  No acute rashes  Foley in place    Basic Metabolic Panel:  Recent Labs  Lab 11/13/20 0527 11/14/20 0629 11/14/20 1732 11/15/20 0509 11/15/20 1719 11/16/20 0437 11/16/20 1019 11/17/20 0332  NA 125* 125*  --  124*  --  130*  --  127*  K 4.3 4.3  --  5.4* 5.5* 6.6* 5.4* 5.0  CL 94* 95*  --  93*  --  100  --  97*  CO2 23 17*  --  21*  --  26  --  25  GLUCOSE 181* 142*  --  235*  --  133*  --  237*  BUN 19 19  --  43*  --  36*  --  22*  CREATININE 0.94 0.88  --  1.54* 2.33* 1.16  --  0.73  CALCIUM 8.6* 9.0  --  9.1  --  8.4*  --  8.1*  MG  --    --  2.7* 2.8* 2.4 2.5*  --  2.1  PHOS  --   --  7.6* 6.5* 4.1 3.3  --  2.8     CBC: Recent Labs  Lab 11/11/20 0650 11/13/20 0527 11/14/20 0629 11/15/20 0509 11/16/20 0437 11/17/20 0332  WBC 7.0  6.9 11.6* 11.2* 12.4* 16.6* 6.8  NEUTROABS 6.0  --   --  11.2* 15.5*  --   HGB 12.7*  12.5* 12.7* 14.2 14.3 13.2 10.6*  HCT 37.6*  39.8 38.2* 41.4 42.8 38.8* 32.9*  MCV 82.8  86 81.4 81.0 81.5 81.9 84.6  PLT 307  293 376 436* 407* 362 267      Lab Results  Component Value Date   HEPBSAG NON REACTIVE 11/11/2020      Microbiology:  Recent Results (from the past 240 hour(s))  MRSA PCR Screening     Status: None   Collection Time: 11/07/20 11:15 AM   Specimen: Nasal Mucosa; Nasopharyngeal  Result Value Ref Range Status   MRSA by PCR NEGATIVE NEGATIVE Final  Comment:        The GeneXpert MRSA Assay (FDA approved for NASAL specimens only), is one component of a comprehensive MRSA colonization surveillance program. It is not intended to diagnose MRSA infection nor to guide or monitor treatment for MRSA infections. Performed at [facility], [place], [place] [address]   Expectorated Sputum Assessment w Gram Stain, Rflx to Resp Cult     Status: None   Collection Time: 11/07/20 11:55 AM   Specimen: Expectorated Sputum  Result Value Ref Range Status   Specimen Description EXPECTORATED SPUTUM  Final   Special Requests NONE  Final   Sputum evaluation   Final    Sputum specimen not acceptable for testing.  Please recollect.   C/JOHN [facility] AT 1227 11/07/20.PMF Performed at [facility], [address] [place], [place] [address]    Report Status 11/07/2020 FINAL  Final  Resp Panel by RT-PCR (Flu A&B, Covid) Nasopharyngeal Swab     Status: None   Collection Time: 11/08/20  3:08 AM   Specimen: Nasopharyngeal Swab; Nasopharyngeal(NP) swabs in vial transport medium  Result Value Ref Range Status   SARS Coronavirus 2 by RT PCR  NEGATIVE NEGATIVE Final    Comment: (NOTE) SARS-CoV-2 target nucleic acids are NOT DETECTED.  The SARS-CoV-2 RNA is generally detectable in upper respiratory specimens during the acute phase of infection. The lowest concentration of SARS-CoV-2 viral copies this assay can detect is 138 copies/mL. A negative result does not preclude SARS-Cov-2 infection and should not be used as the sole basis for treatment or other patient management decisions. A negative result may occur with  improper specimen collection/handling, submission of specimen other than nasopharyngeal swab, presence of viral mutation(s) within the areas targeted by this assay, and inadequate number of viral copies(<138 copies/mL). A negative result must be combined with clinical observations, patient history, and epidemiological information. The expected result is Negative.  Fact Sheet for Patients:  EntrepreneurPulse.com.au  Fact Sheet for Healthcare Providers:  IncredibleEmployment.be  This test is no t yet approved or cleared by the [place] FDA and  has been authorized for detection and/or diagnosis of SARS-CoV-2 by FDA under an Emergency Use Authorization (EUA). This EUA will remain  in effect (meaning this test can be used) for the duration of the COVID-19 declaration under Section 564(b)(1) of the Act, 21 U.S.C.section 360bbb-3(b)(1), unless the authorization is terminated  or revoked sooner.       Influenza A by PCR NEGATIVE NEGATIVE Final   Influenza B by PCR NEGATIVE NEGATIVE Final    Comment: (NOTE) The Xpert Xpress SARS-CoV-2/FLU/RSV plus assay is intended as an aid in the diagnosis of influenza from Nasopharyngeal swab specimens and should not be used as a sole basis for treatment. Nasal washings and aspirates are unacceptable for Xpert Xpress SARS-CoV-2/FLU/RSV testing.  Fact Sheet for Patients: EntrepreneurPulse.com.au  Fact Sheet for  Healthcare Providers: IncredibleEmployment.be  This test is not yet approved or cleared by the [place] FDA and has been authorized for detection and/or diagnosis of SARS-CoV-2 by FDA under an Emergency Use Authorization (EUA). This EUA will remain in effect (meaning this test can be used) for the duration of the COVID-19 declaration under Section 564(b)(1) of the Act, 21 U.S.C. section 360bbb-3(b)(1), unless the authorization is terminated or revoked.  Performed at [facility], [address], [place], [place] [address]   Culture, Respiratory w Gram Stain     Status: None   Collection Time: 11/08/20  4:37  AM   Specimen: Tracheal Aspirate; Respiratory  Result Value Ref Range Status   Specimen Description   Final    TRACHEAL ASPIRATE Performed at [facility], [address] [place], [place] [address]    Special Requests   Final    NONE Performed at [facility], [address] [place], [place] [address]    Gram Stain   Final    MODERATE WBC PRESENT,BOTH PMN AND MONONUCLEAR FEW GRAM POSITIVE COCCI IN CLUSTERS RARE GRAM POSITIVE RODS    Culture   Final    Normal respiratory flora-no Staph aureus or Pseudomonas seen Performed at [facility], [place] [address], [place], [place] [address]    Report Status 11/10/2020 FINAL  Final  Respiratory (~20 pathogens) panel by PCR     Status: Abnormal   Collection Time: 11/08/20 12:50 PM   Specimen: Nasopharyngeal Swab; Respiratory  Result Value Ref Range Status   Adenovirus NOT DETECTED NOT DETECTED Final   Coronavirus 229E NOT DETECTED NOT DETECTED Final    Comment: (NOTE) The Coronavirus on the Respiratory Panel, DOES NOT test for the novel  Coronavirus (2019 nCoV)    Coronavirus HKU1 NOT DETECTED NOT DETECTED Final   Coronavirus NL63 NOT DETECTED NOT DETECTED Final   Coronavirus OC43 NOT DETECTED NOT DETECTED Final   Metapneumovirus NOT DETECTED NOT DETECTED Final    Rhinovirus / Enterovirus DETECTED (A) NOT DETECTED Final   Influenza A NOT DETECTED NOT DETECTED Final   Influenza B NOT DETECTED NOT DETECTED Final   Parainfluenza Virus 1 NOT DETECTED NOT DETECTED Final   Parainfluenza Virus 2 NOT DETECTED NOT DETECTED Final   Parainfluenza Virus 3 NOT DETECTED NOT DETECTED Final   Parainfluenza Virus 4 NOT DETECTED NOT DETECTED Final   Respiratory Syncytial Virus NOT DETECTED NOT DETECTED Final   Bordetella pertussis NOT DETECTED NOT DETECTED Final   Bordetella Parapertussis NOT DETECTED NOT DETECTED Final   Chlamydophila pneumoniae NOT DETECTED NOT DETECTED Final   Mycoplasma pneumoniae NOT DETECTED NOT DETECTED Final    Comment: Performed at [facility], [place], [place], [place] [address]  Culture, Respiratory w Gram Stain     Status: None   Collection Time: 11/08/20 12:55 PM   Specimen: Tracheal Aspirate; Respiratory  Result Value Ref Range Status   Specimen Description   Final    TRACHEAL ASPIRATE Performed at [facility], [address] [place], [place] [address]    Special Requests   Final    NONE Performed at [facility], [address] [place], [place] [address]    Gram Stain   Final    FEW WBC PRESENT,BOTH PMN AND MONONUCLEAR NO ORGANISMS SEEN    Culture   Final    Normal respiratory flora-no Staph aureus or Pseudomonas seen Performed at [place] [address], [place], [place] [address]    Report Status 11/12/2020 FINAL  Final  Culture, fungus without smear     Status: None (Preliminary result)   Collection Time: 11/14/20  1:18 PM   Specimen: Bronchoalveolar Lavage; Other  Result Value Ref Range Status   Specimen Description   Final    BRONCHIAL ALVEOLAR LAVAGE Performed at [facility], [address], [place], [place] [address]    Special Requests   Final    NONE Performed at [facility], [address] [place], [place] [address]    Culture   Final     NO FUNGUS  ISOLATED AFTER 1 DAY Performed at [facility], [place] [address], [place], [place] [address]    Report Status PENDING  Incomplete  Culture, BAL-quantitative w Gram Stain     Status: None (Preliminary result)   Collection Time: 11/14/20  2:02 PM   Specimen: Bronchoalveolar Lavage; Respiratory  Result Value Ref Range Status   Specimen Description   Final    BRONCHIAL ALVEOLAR LAVAGE Performed at [facility], [address] [place], [place] [address]    Special Requests   Final    NONE Performed at [facility], [address] [place], [place] [address]    Gram Stain   Final    ABUNDANT WBC PRESENT, PREDOMINANTLY MONONUCLEAR ABUNDANT GRAM POSITIVE COCCI IN CLUSTERS FEW GRAM POSITIVE COCCI IN PAIRS AND CHAINS RARE GRAM NEGATIVE RODS    Culture   Final    CULTURE REINCUBATED FOR BETTER GROWTH Performed at [facility], [place] [address], [place], [place] [address]    Report Status PENDING  Incomplete    Coagulation Studies: No results for input(s): LABPROT, INR in the last 72 hours.  Urinalysis: No results for input(s): COLORURINE, LABSPEC, PHURINE, GLUCOSEU, HGBUR, BILIRUBINUR, KETONESUR, PROTEINUR, UROBILINOGEN, NITRITE, LEUKOCYTESUR in the last 72 hours.  Invalid input(s): APPERANCEUR    Imaging: DG Chest 1 View  Result Date: 11/15/2020 CLINICAL DATA:  Status post central line placement. EXAM: CHEST  1 VIEW COMPARISON:  Chest radiograph dated 11/14/2020. FINDINGS: There has been interval placement of a left internal jugular central venous catheter with tip overlying the confluence of the brachiocephalic vein and superior vena cava. An endotracheal tube terminates in the midthoracic trachea. An enteric tube terminates in the stomach. The heart size is normal. Severe diffuse bilateral airspace opacities appear increased. There is no pleural effusion or pneumothorax. IMPRESSION: 1. Left internal jugular central venous catheter tip overlying  the confluence of the brachiocephalic vein and superior vena cava. No pneumothorax. 2. Increased severe diffuse bilateral airspace opacities, likely representing pneumonia. Electronically Signed   By: Zerita Boers M.D.   On: 11/15/2020 20:48   DG Chest Port 1 View  Result Date: 11/16/2020 CLINICAL DATA:  Acute respiratory failure EXAM: PORTABLE CHEST 1 VIEW COMPARISON:  Chest x-rays dated 11/15/2020 and 11/14/2020. FINDINGS: Heart size and mediastinal contours are stable. Endotracheal tube with tip at the level of the clavicles. LEFT IJ central line is stable in position with tip at the level of the upper SVC. Enteric tube passes below the diaphragm. Diffuse bilateral airspace opacities, not significantly changed compared to the recent chest x-rays. No pleural effusion is seen. Apparent subcutaneous emphysema overlying the LEFT chest and lower neck. IMPRESSION: 1. Subcutaneous emphysema overlying the LEFT chest wall and within the lower neck. This likely indicates underlying pneumothorax or pneumomediastinum. Consider chest CT for further characterization. 2. Diffuse bilateral airspace opacities, not significantly changed compared to the recent chest x-rays, compatible with multifocal pneumonia. 3. Support apparatus appears adequately positioned. Critical Value/emergent results were called by telephone at the time of interpretation on 11/16/2020 at 10:15 pm to provider BRITTON RUST-CHESTER , who verbally acknowledged these results. Electronically Signed   By: Franki Cabot M.D.   On: 11/16/2020 22:20     Medications:   . sodium chloride Stopped (11/17/20 0500)  . sodium chloride 10 mL/hr at 11/17/20 0500  . sodium chloride 50 mL/hr at 11/17/20 0500  . dexmedetomidine (PRECEDEX) IV infusion 0.8 mcg/kg/hr (11/17/20 0500)  . fentaNYL infusion INTRAVENOUS 200 mcg/hr (11/17/20 0500)  .  midazolam 2 mg/hr (11/17/20 0500)  . norepinephrine (LEVOPHED) Adult infusion Stopped (11/17/20 0416)  .  sulfamethoxazole-trimethoprim 375 mg of trimethoprim (11/17/20 0325)   . chlorhexidine gluconate (MEDLINE KIT)  15 mL Mouth Rinse BID  . Chlorhexidine Gluconate Cloth  6 each Topical Daily  . docusate  100 mg Per Tube BID  . enoxaparin (LOVENOX) injection  40 mg Subcutaneous Q24H  . feeding supplement (PIVOT 1.5 CAL)  1,000 mL Per Tube Q24H  . feeding supplement (PROSource TF)  45 mL Per Tube Daily  . free water  20 mL Per Tube Q4H  . insulin aspart  0-20 Units Subcutaneous Q4H  . mouth rinse  15 mL Mouth Rinse 10 times per day  . methylPREDNISolone (SOLU-MEDROL) injection  40 mg Intravenous Q12H  . pantoprazole (PROTONIX) IV  40 mg Intravenous Daily  . penicillin g benzathine (BICILLIN-LA) IM  2.4 Million Units Intramuscular Weekly  . polyethylene glycol  17 g Per Tube Daily  . sodium zirconium cyclosilicate  10 g Per Tube TID   acetaminophen, acetaminophen, midazolam, ondansetron **OR** ondansetron (ZOFRAN) IV, vecuronium  Assessment/ Plan:  40 y.o. 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  Creatinine peaked at 2.33 on June 4 and has improved to 1.16 today.   Good UOP noted Lab Results  Component Value Date   CREATININE 0.73 11/17/2020   CREATININE 1.16 11/16/2020   CREATININE 2.33 (H) 11/15/2020   06/05 0701 - 06/06 0700 In: 9311 [I.V.:2159.8; ET/KK:4469.5; IV Piggyback:724.4] Out: 3055 [Urine:3055]   Electrolytes and Volume status are acceptable No acute indication for Dialysis at present   #Hyperkalemia Likely secondary to IV Bactrim Agree with shifting measures and maintaining patient on Lokelma  # Acute resp failure  Vent supported Getting treatment  for PJP pneumonia  Will sign off Please reconsult as necessary    LOS: [place] 6/6/20229:23 AM  [facility] [place], [place]  Note: This note was prepared with Dragon dictation. Any transcription errors are unintentional

## 2020-11-17 NOTE — Progress Notes (Signed)
NAME:  Jordan Gibson, MRN:  [id number], DOB:  01-Oct-1980, LOS: 11 ADMISSION DATE:  11/10/2020, INITIAL CONSULTATION DATE: 11/07/2020  REFERRING MD: Dr. Roderic Palau, CHIEF COMPLAINT: Shortness of breath, cough, fever  Brief Patient Description  Jordan Gibson is a 40 y.o. Male with no significant past medical history who presented to [facility] ED on 11/07/20 with a 1 week history of shortness of breath, nonproductive cough, and fever. He was admitted to Acute Hypoxic Respiratory Failure and Sepsis in the setting of suspected Community Acquired Pneumonia.   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 Not on HAART. CD4 count 9, viral load 1.5 million. 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 Also tested positive for Rhinovirus/enterovirus, Syphilis, and Toxoplasma now complicated by ARDS requiring mechanical ventilation support.  Pertinent  Medical History  No significant past medical history  Significant Hospital Events: Including procedures, antibiotic start and stop dates in addition to other pertinent events   11/03/2020: Presented to ED, to be admitted to stepdown unit by hospitalist 11/07/2020: Increasing FiO2 requirements and worsening respiratory status, PCCM consulted due to high risk for deterioration and need for intubation. ABX broadened to Cefepime and Vancomycin, along with Azithromycin 11/08/20- patient with respiratory failure s/p ETT overnight. Vancomycin dcd today MRSA pcr negative.  11/09/20- Pt extubated to HFNC _0  AND 45%.  Patient with severe ARDS. I met with family and reviewed care plan. +Rhino/ENtero virus 11/10/20- patient improved, has been extubated to HFNC.  Tranferring to step down unit, signed out to [facility]. 11/12/20- FiO2 increased to 75%, dcd IV NS at 50/h, have aded lasix 20 bid, decreased prednisone to 40 once daily, stopped po vicodin.  Metaneb ordered BID with RT for recruitment.  Repeat CXR today 11/13/20: Transfferd back to ICU  Due to severe acute hypoxic respiratory  failure 11/14/20: Failed HHFN and BiPAP requiring emergent re- intubation. s/p bedside Bronchoscopy 11/15/20: Patient continues to be hypoxic after increasing FiO2 from 50% to 100% & PEEP from 10 to 12. ABG post these changes revealed a PF ratio of 100 & respiratory acidosis. Proned 6/5 severe hypoxia, ARDS, AIDS, +renal failure  Cultures:  6/3: PJP PCR: pending 6/3: Toxoplasma PCR: pending 6/3: Histoplasma urinary Ag: pending 6/3: BAL: pending 6/1: RPR: Reactive, titer 1:4, Ab (+) 6/1: Cryptococcus Ag: (-) 5/31: Hep B/C screen: (-) 5/31: Quantiferon-TB: (-) 5/31: Histoplasma Gal'man: pending 5/31: Fungitell: pending 5/28:Mycoplasma pneumonia ab >>negative 5/28 Tracheal aspirate: Normal respiratory flora 5/28 Respiratory panel: (+) Rhinovirus / enterovirus 5/26: SARS-CoV-2 PCR>> negative 5/26: Influenza PCR>> negative 5/26: Blood culture x2>>No growth thus far 5/26: Urine Cx>>no growth 5/27: MRSA PCR>> negative 5/27:Strep pneumo urinary antigen>>negative 5/27:Legionella urinary antigen>>negative   Antimicrobials:  5/26 Azithromycin >> 5/31 5/26 Cefepime >> 5/30 5/27 Vancomycin > 5/28 5/31 Bactrim >>  6/3 Penicillin G Benzathine >> 6/4 Anidulafungin >>   Interim History / Subjective:  Severe hypoxia Severe resp failure Multiorgan failure Severe septic shock +PCP pneumonia and AIDS Prognosis is very guarded   CBC    Component Value Date/Time   WBC 6.8 11/17/2020 0332   RBC 3.89 (L) 11/17/2020 0332   HGB 10.6 (L) 11/17/2020 0332   HGB 12.5 (L) 11/11/2020 0650   HCT 32.9 (L) 11/17/2020 0332   HCT 39.8 11/11/2020 0650   PLT 267 11/17/2020 0332   PLT 293 11/11/2020 0650   MCV 84.6 11/17/2020 0332   MCV 86 11/11/2020 0650   MCH 27.2 11/17/2020 0332   MCHC 32.2 11/17/2020 0332   RDW 13.8 11/17/2020 0332   RDW 13.5 11/11/2020 0650  LYMPHSABS 0.7 11/16/2020 0437   LYMPHSABS 0.6 (L) 11/11/2020 0650   MONOABS 0.4 11/16/2020 0437   EOSABS 0.0 11/16/2020 0437   EOSABS  0.2 11/11/2020 0650   BASOSABS 0.0 11/16/2020 0437   BASOSABS 0.0 11/11/2020 0650   CMP Latest Ref Rng & Units 11/17/2020 11/16/2020 11/16/2020  Glucose 70 - 99 mg/dL 237(H) - 133(H)  BUN 6 - 20 mg/dL 22(H) - 36(H)  Creatinine 0.61 - 1.24 mg/dL 0.73 - 1.16  Sodium 135 - 145 mmol/L 127(L) - 130(L)  Potassium 3.5 - 5.1 mmol/L 5.0 5.4(H) 6.6(HH)  Chloride 98 - 111 mmol/L 97(L) - 100  CO2 22 - 32 mmol/L 25 - 26  Calcium 8.9 - 10.3 mg/dL 8.1(L) - 8.4(L)  Total Protein 6.5 - 8.1 g/dL 5.6(L) - 6.7  Total Bilirubin 0.3 - 1.2 mg/dL 0.5 - 0.7  Alkaline Phos 38 - 126 U/L 114 - 150(H)  AST 15 - 41 U/L 36 - 56(H)  ALT 0 - 44 U/L 66(H) - 94(H)     OBJECTIVE   Blood pressure 109/62, pulse 71, temperature (!) 97.34 F (36.3 C), resp. rate (!) 28, height _0  (1.702 m), weight 71.4 kg, SpO2 95 %.    Vent Mode: PRVC FiO2 (%):  [50 %-80 %] 50 % Set Rate:  [16 bmp-28 bmp] 28 bmp Vt Set:  [500 mL] 500 mL PEEP:  [10 cmH20-16 cmH20] 10 cmH20 Plateau Pressure:  [25 EUM35-36 cmH20] 25 cmH20   Intake/Output Summary (Last 24 hours) at 11/17/2020 0735 Last data filed at 11/17/2020 0603 Gross per 24 hour  Intake 5862.99 ml  Output 3055 ml  Net 2807.99 ml   Filed Weights   11/11/20 0536 11/16/20 0500 11/17/20 0500  Weight: 74.9 kg 71.2 kg 71.4 kg   REVIEW OF SYSTEMS  PATIENT IS UNABLE TO PROVIDE COMPLETE REVIEW OF SYSTEMS DUE TO SEVERE CRITICAL ILLNESS AND TOXIC METABOLIC ENCEPHALOPATHY   PHYSICAL EXAMINATION:  GENERAL:critically ill appearing, +resp distress HEAD: Normocephalic, atraumatic.  EYES: Pupils equal, round, reactive to light.  No scleral icterus.  MOUTH: Moist mucosal membrane. NECK: Supple. No thyromegaly. No nodules. No JVD.  PULMONARY: +rhonchi, +wheezing CARDIOVASCULAR: S1 and S2. Regular rate and rhythm. No murmurs, rubs, or gallops.  GASTROINTESTINAL: Soft, nontender, -distended. Positive bowel sounds.  MUSCULOSKELETAL: No swelling, clubbing, or edema.  NEUROLOGIC:  obtunded SKIN:intact,warm,dry    Labs/imaging that I havepersonally reviewed  (right click and "Reselect all SmartList Selections" daily)    Labs   CBC: Recent Labs  Lab 11/11/20 0650 11/13/20 0527 11/14/20 0629 11/15/20 0509 11/16/20 0437 11/17/20 0332  WBC 7.0  6.9 11.6* 11.2* 12.4* 16.6* 6.8  NEUTROABS 6.0  --   --  11.2* 15.5*  --   HGB 12.7*  12.5* 12.7* 14.2 14.3 13.2 10.6*  HCT 37.6*  39.8 38.2* 41.4 42.8 38.8* 32.9*  MCV 82.8  86 81.4 81.0 81.5 81.9 84.6  PLT 307  293 376 436* 407* 362 144    Basic Metabolic Panel: Recent Labs  Lab 11/13/20 0527 11/14/20 0629 11/14/20 1732 11/15/20 0509 11/15/20 1719 11/16/20 0437 11/16/20 1019 11/17/20 0332  NA 125* 125*  --  124*  --  130*  --  127*  K 4.3 4.3  --  5.4* 5.5* 6.6* 5.4* 5.0  CL 94* 95*  --  93*  --  100  --  97*  CO2 23 17*  --  21*  --  26  --  25  GLUCOSE 181* 142*  --  235*  --  133*  --  237*  BUN 19 19  --  43*  --  36*  --  22*  CREATININE 0.94 0.88  --  1.54* 2.33* 1.16  --  0.73  CALCIUM 8.6* 9.0  --  9.1  --  8.4*  --  8.1*  MG  --   --  2.7* 2.8* 2.4 2.5*  --  2.1  PHOS  --   --  7.6* 6.5* 4.1 3.3  --  2.8   GFR: Estimated Creatinine Clearance: 113.6 mL/min (by C-G formula based on SCr of 0.73 mg/dL). Recent Labs  Lab 11/14/20 0629 11/15/20 0509 11/15/20 1935 11/15/20 2248 11/16/20 0437 11/16/20 0441 11/17/20 0332  WBC 11.2* 12.4*  --   --  16.6*  --  6.8  LATICACIDVEN  --   --  3.3* 2.5*  --  1.9  --     Liver Function Tests: Recent Labs  Lab 11/15/20 0509 11/16/20 0437 11/17/20 0332  AST 28 56* 36  ALT 79* 94* 66*  ALKPHOS 150* 150* 114  BILITOT 0.5 0.7 0.5  PROT 7.4 6.7 5.6*  ALBUMIN 2.7* 2.3* 1.9*    ABG    Component Value Date/Time   PHART 7.30 (L) 11/16/2020 0441   PCO2ART 50 (H) 11/16/2020 0441   PO2ART 187 (H) 11/16/2020 0441   HCO3 24.6 11/16/2020 0441   ACIDBASEDEF 2.3 (H) 11/16/2020 0441   O2SAT 99.6 11/16/2020 0441     Coagulation Profile: No  results for input(s): INR, PROTIME in the last 168 hours.  Cardiac Enzymes: No results for input(s): CKTOTAL, CKMB, CKMBINDEX, TROPONINI in the last 168 hours.  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  Severe ACUTE Hypoxic and Hypercapnic Respiratory Failure -continue Mechanical Ventilator support -continue Bronchodilator Therapy -Wean Fio2 and PEEP as tolerated -VAP/VENT bundle implementation Unable to wean from vent  -Initiate Lung protective strategies: PRVC  6-8 mL/kg, titrate down if peak pressure>30 mm Hg,   Sepsis with septic shock due to oppotunistics infection PJP, 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 + rhinovirus/enterovirus + syphilis + toxoplasmosis Ab -TOXOplasma reactivation-  Continue IV bactrim and Prednisone per ID recs -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- will treat with 3 doses of benzathine penicillin weekly per ID recs -IVF hydration as needed -Continue vasopressors to maintain MAP> 65, norepinephrine  ACUTE KIDNEY INJURY/Renal Failure -continue Foley Catheter-assess need -Avoid nephrotoxic agents -Follow urine output, BMP -Ensure adequate renal perfusion, optimize oxygenation -Renal dose medications -Nephrology input, appreciated   Newly diagnosed HIV/AIDS -CD4 is 7 and VL 1.5 million copies -Will start HAART after 7-10 days of PCP treatment per ID -ID following, input appreciated   Best practice (right click and "Reselect all SmartList Selections" daily)  Diet:  Tube Feed  Pain/Anxiety/Delirium protocol (if indicated): Yes (RASS goal -1) VAP protocol (if indicated): Yes DVT prophylaxis:  Contraindicated GI prophylaxis: PPI Glucose control:  SSI Yes Central venous access:  Yes, and it is still needed Arterial line:  N/A Foley:  Yes, and it is still needed Mobility:  bed rest  PT consulted: N/A Last date of multidisciplinary goals of care discussion [6/5] Code Status:  full code Disposition: ICU    DVT/GI PRX  assessed I Assessed the need for Labs I Assessed the need for Foley I Assessed the need for Central Venous Line Family Discussion when available I Assessed the need for Mobilization I made an Assessment of medications to be adjusted accordingly Safety Risk assessment completed  CASE DISCUSSED IN MULTIDISCIPLINARY ROUNDS WITH ICU TEAM     Critical Care Time devoted to patient care services described in this note is 65 minutes.  Critical care was necessary to treat /prevent imminent and life-threatening deterioration. Overall, patient is critically ill, prognosis is guarded.  Patient with Multiorgan failure and at high risk for cardiac arrest and death.    Corrin Parker, M.D.  [facility]  Medical Director [place] Director [facility] Cardio-Pulmonary Department

## 2020-11-17 NOTE — Progress Notes (Signed)
Patient continued on mechanical ventilation during shift. Titrated sedation for patient comfort and ventilator synchrony. Patient was able to be more alert this shift and started to follow some commands.

## 2020-11-17 NOTE — Progress Notes (Signed)
Date of Admission:  10/16/2020     Antibiotic Cefepime 5/27>5/30 Azithromycin 5/26>5/31  Bactrim IV 5/31>>  LDA Coude urethral catheter 11/15/20 Left IJ triple lumen 11/15/20 Airway 11/14/20>> Airway 5/28>>5/29 Foley 5/28>.5/29  Subjective: Intubated and sedated   Medications:  . chlorhexidine gluconate (MEDLINE KIT)  15 mL Mouth Rinse BID  . Chlorhexidine Gluconate Cloth  6 each Topical Daily  . docusate  100 mg Per Tube BID  . enoxaparin (LOVENOX) injection  40 mg Subcutaneous Q24H  . feeding supplement (PIVOT 1.5 CAL)  1,000 mL Per Tube Q24H  . feeding supplement (PROSource TF)  45 mL Per Tube Daily  . free water  20 mL Per Tube Q4H  . insulin aspart  0-20 Units Subcutaneous Q4H  . mouth rinse  15 mL Mouth Rinse 10 times per day  . methylPREDNISolone (SOLU-MEDROL) injection  40 mg Intravenous Q12H  . pantoprazole (PROTONIX) IV  40 mg Intravenous Daily  . penicillin g benzathine (BICILLIN-LA) IM  2.4 Million Units Intramuscular Weekly  . polyethylene glycol  17 g Per Tube Daily  . sodium zirconium cyclosilicate  10 g Per Tube TID    Objective: Vital signs in last 24 hours: Patient Vitals for the past 24 hrs:  BP Temp Temp src Pulse Resp SpO2 Weight  11/17/20 0802 -- 98.2 F (36.8 C) Oral -- -- -- --  11/17/20 0800 -- -- Esophageal -- -- -- --  11/17/20 0740 -- -- -- 68 (!) 28 97 % --  11/17/20 0630 109/62 (!) 97.34 F (36.3 C) -- 71 (!) 28 95 % --  11/17/20 0600 (!) 103/58 (!) 97.34 F (36.3 C) Esophageal 73 (!) 28 96 % --  11/17/20 0530 (!) 100/59 (!) 97.52 F (36.4 C) -- 74 (!) 28 96 % --  11/17/20 0500 -- -- -- -- -- -- 71.4 kg  11/17/20 0430 107/64 98.24 F (36.8 C) -- 76 (!) 28 97 % --  11/17/20 0400 (!) 111/59 98.78 F (37.1 C) -- 80 (!) 22 96 % --  11/17/20 0300 103/61 99.5 F (37.5 C) -- 80 (!) 28 98 % --  11/17/20 0219 -- -- -- -- -- 98 % --  11/17/20 0200 118/67 100.04 F (37.8 C) -- 80 (!) 28 99 % --  11/17/20 0100 111/65 (!) 100.4 F (38 C) -- 83  (!) 28 99 % --  11/17/20 0000 124/60 (!) 100.4 F (38 C) -- 84 (!) 28 99 % --  11/16/20 2300 (!) 118/59 (!) 100.76 F (38.2 C) -- 86 (!) 28 98 % --  11/16/20 2200 (!) 107/53 (!) 101.66 F (38.7 C) -- 89 (!) 28 97 % --  11/16/20 2100 123/68 (!) 102.02 F (38.9 C) -- 88 (!) 28 99 % --  11/16/20 2000 101/63 (!) 101.84 F (38.8 C) Esophageal 97 (!) 28 98 % --  11/16/20 1900 106/65 (!) 101.3 F (38.5 C) -- 95 (!) 28 97 % --  11/16/20 1832 -- (!) 101.1 F (38.4 C) -- -- -- -- --  11/16/20 1830 117/70 (!) 100.94 F (38.3 C) -- 92 (!) 28 100 % --  11/16/20 1815 137/79 (!) 100.76 F (38.2 C) -- 87 (!) 28 100 % --  11/16/20 1807 (!) 139/92 (!) 100.58 F (38.1 C) -- 85 (!) 28 100 % --  11/16/20 1805 (!) 78/47 (!) 100.58 F (38.1 C) -- 93 (!) 28 100 % --  11/16/20 1800 (!) 59/40 (!) 100.58 F (38.1 C) -- 92 (!) 28 100 % --  11/16/20 1730 105/69 (!) 100.4 F (38 C) -- 88 (!) 28 100 % --  11/16/20 1630 90/61 100.04 F (37.8 C) -- 89 (!) 28 100 % --  11/16/20 1600 92/61 100.04 F (37.8 C) -- 87 (!) 28 100 % --  11/16/20 1530 (!) 88/61 99.32 F (37.4 C) -- 90 (!) 28 98 % --  11/16/20 1500 102/62 (!) 100.4 F (38 C) -- 85 (!) 28 98 % --  11/16/20 1430 101/64 (!) 100.4 F (38 C) -- 88 (!) 28 98 % --  11/16/20 1400 101/65 (!) 100.4 F (38 C) -- 88 (!) 28 97 % --  11/16/20 1332 -- -- -- -- -- 94 % --  11/16/20 1330 (!) 93/57 100.04 F (37.8 C) -- 88 (!) 28 98 % --  11/16/20 1300 106/66 99.86 F (37.7 C) -- 87 (!) 28 97 % --  11/16/20 1230 111/66 99.86 F (37.7 C) -- 87 (!) 28 98 % --  11/16/20 1200 108/66 99.86 F (37.7 C) -- 87 (!) 28 97 % --  11/16/20 1130 112/70 99.5 F (37.5 C) -- 87 (!) 28 96 % --  11/16/20 1100 (!) 104/58 99.86 F (37.7 C) -- 86 (!) 28 97 % --  11/16/20 1030 (!) 102/59 99.86 F (37.7 C) -- 85 (!) 28 97 % --  11/16/20 1000 (!) 101/57 99.86 F (37.7 C) -- 88 (!) 28 97 % --  11/16/20 0930 (!) 96/56 99.68 F (37.6 C) -- 88 (!) 28 97 % --   PHYSICAL EXAM:   General: intubated, sedated , low dose pressor Lungs: b/l air entry- crept sb/l Heart: Regular rate and rhythm, no murmur, rub or gallop. Abdomen: Soft, non-tender,not distended. Bowel sounds normal. No masses Extremities: atraumatic, no cyanosis. No edema. No clubbing Skin: No rashes or lesions. Or bruising Lymph: Cervical, supraclavicular Neuro cannot assess   Lab Results Recent Labs    11/16/20 0437 11/16/20 1019 11/17/20 0332  WBC 16.6*  --  6.8  HGB 13.2  --  10.6*  HCT 38.8*  --  32.9*  NA 130*  --  127*  K 6.6* 5.4* 5.0  CL 100  --  97*  CO2 26  --  25  BUN 36*  --  22*  CREATININE 1.16  --  0.73   Liver Panel Recent Labs    11/16/20 0437 11/17/20 0332  PROT 6.7 5.6*  ALBUMIN 2.3* 1.9*  AST 56* 36  ALT 94* 66*  ALKPHOS 150* 114  BILITOT 0.7 0.5   Sedimentation Rate No results for input(s): ESRSEDRATE in the last 72 hours. C-Reactive Protein No results for input(s): CRP in the last 72 hours.  Microbiology:  Studies/Results: DG Chest 1 View  Result Date: 11/15/2020 CLINICAL DATA:  Status post central line placement. EXAM: CHEST  1 VIEW COMPARISON:  Chest radiograph dated 11/14/2020. FINDINGS: There has been interval placement of a left internal jugular central venous catheter with tip overlying the confluence of the brachiocephalic vein and superior vena cava. An endotracheal tube terminates in the midthoracic trachea. An enteric tube terminates in the stomach. The heart size is normal. Severe diffuse bilateral airspace opacities appear increased. There is no pleural effusion or pneumothorax. IMPRESSION: 1. Left internal jugular central venous catheter tip overlying the confluence of the brachiocephalic vein and superior vena cava. No pneumothorax. 2. Increased severe diffuse bilateral airspace opacities, likely representing pneumonia. Electronically Signed   By: Zerita Boers M.D.   On: 11/15/2020 20:48   DG Chest  Port 1 View  Result Date: 11/16/2020 CLINICAL  DATA:  Acute respiratory failure EXAM: PORTABLE CHEST 1 VIEW COMPARISON:  Chest x-rays dated 11/15/2020 and 11/14/2020. FINDINGS: Heart size and mediastinal contours are stable. Endotracheal tube with tip at the level of the clavicles. LEFT IJ central line is stable in position with tip at the level of the upper SVC. Enteric tube passes below the diaphragm. Diffuse bilateral airspace opacities, not significantly changed compared to the recent chest x-rays. No pleural effusion is seen. Apparent subcutaneous emphysema overlying the LEFT chest and lower neck. IMPRESSION: 1. Subcutaneous emphysema overlying the LEFT chest wall and within the lower neck. This likely indicates underlying pneumothorax or pneumomediastinum. Consider chest CT for further characterization. 2. Diffuse bilateral airspace opacities, not significantly changed compared to the recent chest x-rays, compatible with multifocal pneumonia. 3. Support apparatus appears adequately positioned. Critical Value/emergent results were called by telephone at the time of interpretation on 11/16/2020 at 10:15 pm to provider BRITTON RUST-CHESTER , who verbally acknowledged these results. Electronically Signed   By: Franki Cabot M.D.   On: 11/16/2020 22:20     Assessment/Plan: AIDS  Acute hypoxic respiratory failure with b/l infiltrates likely due to PJP ( PCP) PCR still pending but beta D glucan > 500 Initially intubated and extubated after 24 hrs- reinutbated on 6/3  Fever since 11/15/20 Had fever on admission If it recurs will do blood culture  and urine analysis  Toxoplasma IgG high- reactivation of toxo  Syphilis- being treated like late syphilis- will get 3 doses of benzathine penicillin  Hyponatremia- ?secondary hypoaldosteronism  Hyperkalemia- followed by nephrologist Watch closely on bactrim and it may have to be changed if hyper K persist

## 2020-11-17 NOTE — Progress Notes (Addendum)
ETT advanced 2cm per MD request based on CXR results @ 25 now

## 2020-11-17 NOTE — Progress Notes (Addendum)
Nutrition Follow-up  DOCUMENTATION CODES:   Not applicable  INTERVENTION:   Change to Vital 1.5@60ml /hr + ProSource 49ml BID via tube   Free water flushes 32ml q4 hours to maintain tube patency   Regimen provides 2240kcal/day, 119g/day protein and 1238ml/day free water   NUTRITION DIAGNOSIS:   Inadequate oral intake related to  (pt sedated and ventilated) as evidenced by NPO status.  GOAL:   Provide needs based on ASPEN/SCCM guidelines  MONITOR:   Vent status,Labs,Weight trends,TF tolerance,Skin,I & O's  ASSESSMENT:   40 y.o. Male with no significant past medical history who presented to [facility] ED on 11/07/20 with a 1 week history of shortness of breath, nonproductive cough and fever. Pt admitted with acute hypoxic respiratory failure and sepsis in the setting of suspected community acquired pneumonia. Pt also positive for HIV   Pt remains sedated and ventilated. Pt tolerating tube feeds well at goal rate; RD will adjust feeds as pt is no longer on propofol. No BM since 6/1; pt is on bowel regimen.   Medications reviewed and include: colace, lovenox, insulin, solu-medrol, protonix, bicillin, miralax, lokelma, precedex, fentanyl, bactrim   Labs reviewed: Na 127(L), K 5.0 wnl, BUN 22(H), P 2.8 wnl, Mg 2.1 wnl Hgb 10.6(L), Hct 32.9(L) cbgs- 239, 191, 192 x 24 hrs AIC 5.9(H)- 6/1  Patient is currently intubated on ventilator support MV: 15.3 L/min Temp (24hrs), Avg:100 F (37.8 C), Min:97.34 F (36.3 C), Max:102.02 F (38.9 C)  Propofol: none   MAP- >74mmHg   UOP-  Diet Order:   Diet Order            Diet NPO time specified  Diet effective now                EDUCATION NEEDS:   No education needs have been identified at this time  Skin:  Skin Assessment: Reviewed RN Assessment  Last BM:  6/1 per RN documentation  Height:   Ht Readings from Last 1 Encounters:  11/15/20 5\' 7"  (1.702 m)    Weight:   Wt Readings from Last 1 Encounters:   11/17/20 71.4 kg   BMI:  Body mass index is 24.65 kg/m.  Estimated Nutritional Needs:   Kcal:  2274kcal/day  Protein:  110-120  Fluid:  2.1-2.4L/day  01/17/21 MS, RD, LDN Please refer to [facility] for RD and/or RD on-call/weekend/after hours pager

## 2020-11-17 NOTE — Progress Notes (Signed)
GOALS OF CARE DISCUSSION  The Clinical status was relayed to family in detail. Sister at bedside Updated and notified of patients medical condition.    Patient remains unresponsive and will not open eyes to command.   Patient is having a weak cough and struggling to remove secretions.   Patient with increased WOB and using accessory muscles to breathe Explained to family course of therapy and the modalities    Patient with Progressive multiorgan failure with a very high probablity of a very minimal chance of meaningful recovery despite all aggressive and optimal medical therapy.  PATIENT REMAINS FULL CODE  Family understands the situation. Severe hypoxia Continue vent support   Family are satisfied with Plan of action and management. All questions answered  Additional CC time 35 mins   Jordan Gibson Jordan Gibson, M.D.  [facility]  Medical Director [facility] Medical Director [facility] Cardio-Pulmonary Department

## 2020-11-17 NOTE — Progress Notes (Signed)
Inpatient Diabetes Program Recommendations  AACE/ADA: New Consensus Statement on Inpatient Glycemic Control (2015)  Target Ranges:  Prepandial:   less than 140 mg/dL      Peak postprandial:   less than 180 mg/dL (1-2 hours)      Critically ill patients:  140 - 180 mg/dL   Results for BERNADETTE, ARMIJO (MRN [id number]) as of 11/17/2020 12:18  Ref. Range 11/17/2020 00:09 11/17/2020 07:51 11/17/2020 11:20  Glucose-Capillary Latest Ref Range: 70 - 99 mg/dL 103 (H)  7 units NOVOLOG  191 (H)  4 units NOVOLOG  192 (H)      Current Orders: Novolog 0-20 units Q4H   MD- Note patient getting Solumedrol 40 mg BID. Currently on Vent--Increasing Tube Feeds to 60cc/hr today  CBGs >190 so far today  Please consider adding low dose Novolog Tube Feed Coverage:  Novolog 3 units Q4 hours  HOLD if tube feeds HELD for any reason    --Will follow patient during hospitalization--  Ambrose Finland RN, MSN, CDE Diabetes Coordinator Inpatient Glycemic Control Team Team Pager: [phone] (8a-5p)

## 2020-11-18 ENCOUNTER — Inpatient Hospital Stay: Payer: Medicaid Other

## 2020-11-18 DIAGNOSIS — J80 Acute respiratory distress syndrome: Secondary | ICD-10-CM

## 2020-11-18 LAB — COMPREHENSIVE METABOLIC PANEL
ALT: 73 U/L — ABNORMAL HIGH (ref 0–44)
AST: 54 U/L — ABNORMAL HIGH (ref 15–41)
Albumin: 1.9 g/dL — ABNORMAL LOW (ref 3.5–5.0)
Alkaline Phosphatase: 115 U/L (ref 38–126)
Anion gap: 3 — ABNORMAL LOW (ref 5–15)
BUN: 16 mg/dL (ref 6–20)
CO2: 26 mmol/L (ref 22–32)
Calcium: 8.2 mg/dL — ABNORMAL LOW (ref 8.9–10.3)
Chloride: 96 mmol/L — ABNORMAL LOW (ref 98–111)
Creatinine, Ser: 0.59 mg/dL — ABNORMAL LOW (ref 0.61–1.24)
GFR, Estimated: >60 mL/min (ref >60)
Glucose, Bld: 206 mg/dL — ABNORMAL HIGH (ref 70–99)
Potassium: 4.4 mmol/L (ref 3.5–5.1)
Sodium: 125 mmol/L — ABNORMAL LOW (ref 135–145)
Total Bilirubin: 0.4 mg/dL (ref 0.3–1.2)
Total Protein: 5.7 g/dL — ABNORMAL LOW (ref 6.5–8.1)

## 2020-11-18 LAB — GLUCOSE 6 PHOSPHATE DEHYDROGENASE
G6PDH: 11.3 U/g{Hb} (ref 3.8–14.2)
Hemoglobin: 10.2 g/dL — ABNORMAL LOW (ref 13.0–17.7)

## 2020-11-18 LAB — CBC WITH DIFFERENTIAL/PLATELET
Abs Immature Granulocytes: 0.07 10*3/uL (ref 0.00–0.07)
Basophils Absolute: 0 10*3/uL (ref 0.0–0.1)
Basophils Relative: 0 %
Eosinophils Absolute: 0 10*3/uL (ref 0.0–0.5)
Eosinophils Relative: 0 %
HCT: 32.1 % — ABNORMAL LOW (ref 39.0–52.0)
Hemoglobin: 10.3 g/dL — ABNORMAL LOW (ref 13.0–17.0)
Immature Granulocytes: 1 %
Lymphocytes Relative: 3 %
Lymphs Abs: 0.2 10*3/uL — ABNORMAL LOW (ref 0.7–4.0)
MCH: 26.9 pg (ref 26.0–34.0)
MCHC: 32.1 g/dL (ref 30.0–36.0)
MCV: 83.8 fL (ref 80.0–100.0)
Monocytes Absolute: 0.2 10*3/uL (ref 0.1–1.0)
Monocytes Relative: 2 %
Neutro Abs: 7.4 10*3/uL (ref 1.7–7.7)
Neutrophils Relative %: 94 %
Platelets: 222 10*3/uL (ref 150–400)
RBC: 3.83 MIL/uL — ABNORMAL LOW (ref 4.22–5.81)
RDW: 13.6 % (ref 11.5–15.5)
WBC: 7.8 10*3/uL (ref 4.0–10.5)
nRBC: 0 % (ref 0.0–0.2)

## 2020-11-18 LAB — URINALYSIS, ROUTINE W REFLEX MICROSCOPIC
Bacteria, UA: NONE SEEN
Bilirubin Urine: NEGATIVE
Glucose, UA: NEGATIVE mg/dL
Ketones, ur: NEGATIVE mg/dL
Leukocytes,Ua: NEGATIVE
Nitrite: NEGATIVE
Protein, ur: NEGATIVE mg/dL
RBC / HPF: >50 RBC/hpf — ABNORMAL HIGH (ref 0–5)
Specific Gravity, Urine: 1.02 (ref 1.005–1.030)
Squamous Epithelial / HPF: NONE SEEN (ref 0–5)
pH: 6 (ref 5.0–8.0)

## 2020-11-18 LAB — GLUCOSE, CAPILLARY
Glucose-Capillary: 127 mg/dL — ABNORMAL HIGH (ref 70–99)
Glucose-Capillary: 135 mg/dL — ABNORMAL HIGH (ref 70–99)
Glucose-Capillary: 161 mg/dL — ABNORMAL HIGH (ref 70–99)
Glucose-Capillary: 172 mg/dL — ABNORMAL HIGH (ref 70–99)
Glucose-Capillary: 180 mg/dL — ABNORMAL HIGH (ref 70–99)
Glucose-Capillary: 184 mg/dL — ABNORMAL HIGH (ref 70–99)
Glucose-Capillary: 186 mg/dL — ABNORMAL HIGH (ref 70–99)
Glucose-Capillary: 187 mg/dL — ABNORMAL HIGH (ref 70–99)

## 2020-11-18 LAB — MAGNESIUM: Magnesium: 1.8 mg/dL (ref 1.7–2.4)

## 2020-11-18 LAB — PHOSPHORUS: Phosphorus: 2.7 mg/dL (ref 2.5–4.6)

## 2020-11-18 LAB — PROCALCITONIN: Procalcitonin: 0.96 ng/mL

## 2020-11-18 MED ORDER — OXYCODONE HCL 5 MG/5ML PO SOLN
5.0000 mg | Freq: Four times a day (QID) | ORAL | Status: DC
  Administered 2020-11-18 – 2020-11-23 (×16): 5 mg
  Filled 2020-11-18 (×16): qty 5

## 2020-11-18 MED ORDER — DIAZEPAM 2 MG PO TABS
1.0000 mg | ORAL_TABLET | Freq: Four times a day (QID) | ORAL | Status: DC
  Administered 2020-11-18 – 2020-11-19 (×3): 1 mg
  Filled 2020-11-18 (×3): qty 1

## 2020-11-18 MED ORDER — HYDROMORPHONE HCL PF 10 MG/ML IJ SOLN
0.5000 mg/h | INTRAMUSCULAR | Status: DC
  Administered 2020-11-18 – 2020-11-24 (×7): 2 mg/h via INTRAVENOUS
  Administered 2020-11-25 – 2020-12-02 (×14): 4 mg/h via INTRAVENOUS
  Filled 2020-11-18 (×21): qty 5

## 2020-11-18 MED ORDER — LORAZEPAM 2 MG/ML IJ SOLN
2.0000 mg | INTRAMUSCULAR | Status: DC | PRN
  Administered 2020-11-18 (×3): 4 mg via INTRAVENOUS
  Filled 2020-11-18 (×3): qty 2

## 2020-11-18 MED ORDER — DIAZEPAM 1 MG/ML PO SOLN: 1.0000 mg | Freq: Four times a day (QID) | ORAL | Status: DC

## 2020-11-18 MED ORDER — PIPERACILLIN-TAZOBACTAM 3.375 G IVPB
3.3750 g | Freq: Three times a day (TID) | INTRAVENOUS | Status: DC
  Administered 2020-11-18 – 2020-11-20 (×7): 3.375 g via INTRAVENOUS
  Filled 2020-11-18 (×7): qty 50

## 2020-11-18 MED ORDER — SODIUM ZIRCONIUM CYCLOSILICATE 5 G PO PACK
10.0000 g | PACK | Freq: Two times a day (BID) | ORAL | Status: DC
  Administered 2020-11-18 – 2020-11-20 (×5): 10 g
  Filled 2020-11-18 (×5): qty 2

## 2020-11-18 NOTE — Progress Notes (Signed)
Pt desaturated to 79% on 40% FIO2. RT called. Pt suctioned and FIO2 increased to 60% per RT. Pt's O2 sats increased to 90% at this time. Pt has fever of 102.0, tylenol given per OGT. PRN ativan and vec given per MD order.

## 2020-11-18 NOTE — Progress Notes (Signed)
NAME:  [name], MRN:  [id number], DOB:  12/24/1980, LOS: 12 ADMISSION DATE:  10/23/2020, INITIAL CONSULTATION DATE: 11/07/2020  REFERRING MD: Dr. Roderic Palau, CHIEF COMPLAINT: Shortness of breath, cough, fever  Brief Patient Description  Jordan Gibson is a 40 y.o. Male with no significant past medical history who presented to [facility] ED on 11/07/20 with a 1 week history of shortness of breath, nonproductive cough, and fever. He was admitted to Acute Hypoxic Respiratory Failure and Sepsis in the setting of suspected Community Acquired Pneumonia.   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 Not on HAART. CD4 count 9, viral load 1.5 million. 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 Also tested positive for Rhinovirus/enterovirus, Syphilis, and Toxoplasma now complicated by ARDS requiring mechanical ventilation support.  Pertinent  Medical History  No significant past medical history  Significant Hospital Events: Including procedures, antibiotic start and stop dates in addition to other pertinent events   10/22/2020: Presented to ED, to be admitted to stepdown unit by hospitalist 11/07/2020: Increasing FiO2 requirements and worsening respiratory status, PCCM consulted due to high risk for deterioration and need for intubation. ABX broadened to Cefepime and Vancomycin, along with Azithromycin 11/08/20- patient with respiratory failure s/p ETT overnight. Vancomycin dcd today MRSA pcr negative.  11/09/20- Pt extubated to HFNC _0  AND 45%.  Patient with severe ARDS. I met with family and reviewed care plan. +Rhino/ENtero virus 11/10/20- patient improved, has been extubated to HFNC.  Tranferring to step down unit, signed out to [facility]. 11/12/20- FiO2 increased to 75%, dcd IV NS at 50/h, have aded lasix 20 bid, decreased prednisone to 40 once daily, stopped po vicodin.  Metaneb ordered BID with RT for recruitment.  Repeat CXR today 11/13/20: Transfferd back to ICU  Due to severe acute hypoxic respiratory  failure 11/14/20: Failed HHFN and BiPAP requiring emergent re- intubation. s/p bedside Bronchoscopy 11/15/20: Patient continues to be hypoxic after increasing FiO2 from 50% to 100% & PEEP from 10 to 12. ABG post these changes revealed a PF ratio of 100 & respiratory acidosis. Proned 6/5 severe hypoxia, ARDS, AIDS, +renal failure 6/7 severe hypoxia, ALI  Cultures:  6/3: PJP PCR: pending 6/3: Toxoplasma PCR: pending 6/3: Histoplasma urinary Ag: pending 6/3: BAL: pending 6/1: RPR: Reactive, titer 1:4, Ab (+) 6/1: Cryptococcus Ag: (-) 5/31: Hep B/C screen: (-) 5/31: Quantiferon-TB: (-) 5/31: Histoplasma Gal'man: pending 5/31: Fungitell: pending 5/28:Mycoplasma pneumonia ab >>negative 5/28 Tracheal aspirate: Normal respiratory flora 5/28 Respiratory panel: (+) Rhinovirus / enterovirus 5/26: SARS-CoV-2 PCR>> negative 5/26: Influenza PCR>> negative 5/26: Blood culture x2>>No growth thus far 5/26: Urine Cx>>no growth 5/27: MRSA PCR>> negative 5/27:Strep pneumo urinary antigen>>negative 5/27:Legionella urinary antigen>>negative   Antimicrobials:  5/26 Azithromycin >> 5/31 5/26 Cefepime >> 5/30 5/27 Vancomycin > 5/28 5/31 Bactrim >>  6/3 Penicillin G Benzathine >> 6/4 Anidulafungin >>   Interim History / Subjective:  Severe hypoxia resp distress Remains critically ill\ LFT's rising       CBC    Component Value Date/Time   WBC 7.8 11/18/2020 0405   RBC 3.83 (L) 11/18/2020 0405   HGB 10.3 (L) 11/18/2020 0405   HGB 12.5 (L) 11/11/2020 0650   HCT 32.1 (L) 11/18/2020 0405   HCT 39.8 11/11/2020 0650   PLT 222 11/18/2020 0405   PLT 293 11/11/2020 0650   MCV 83.8 11/18/2020 0405   MCV 86 11/11/2020 0650   MCH 26.9 11/18/2020 0405   MCHC 32.1 11/18/2020 0405   RDW 13.6 11/18/2020 0405   RDW 13.5 11/11/2020 0650  LYMPHSABS 0.2 (L) 11/18/2020 0405   LYMPHSABS 0.6 (L) 11/11/2020 0650   MONOABS 0.2 11/18/2020 0405   EOSABS 0.0 11/18/2020 0405   EOSABS 0.2 11/11/2020 0650    BASOSABS 0.0 11/18/2020 0405   BASOSABS 0.0 11/11/2020 0650   CMP Latest Ref Rng & Units 11/18/2020 11/17/2020 11/16/2020  Glucose 70 - 99 mg/dL 206(H) 237(H) -  BUN 6 - 20 mg/dL 16 22(H) -  Creatinine 0.61 - 1.24 mg/dL 0.59(L) 0.73 -  Sodium 135 - 145 mmol/L 125(L) 127(L) -  Potassium 3.5 - 5.1 mmol/L 4.4 5.0 5.4(H)  Chloride 98 - 111 mmol/L 96(L) 97(L) -  CO2 22 - 32 mmol/L 26 25 -  Calcium 8.9 - 10.3 mg/dL 8.2(L) 8.1(L) -  Total Protein 6.5 - 8.1 g/dL 5.7(L) 5.6(L) -  Total Bilirubin 0.3 - 1.2 mg/dL 0.4 0.5 -  Alkaline Phos 38 - 126 U/L 115 114 -  AST 15 - 41 U/L 54(H) 36 -  ALT 0 - 44 U/L 73(H) 66(H) -     OBJECTIVE   Blood pressure 139/84, pulse (!) 119, temperature 98.4 F (36.9 C), temperature source Axillary, resp. rate (!) 27, height _0  (1.702 m), weight 71.4 kg, SpO2 91 %.    Vent Mode: PRVC FiO2 (%):  [40 %-50 %] 50 % Set Rate:  [28 bmp] 28 bmp Vt Set:  [500 mL] 500 mL PEEP:  [10 cmH20] 10 cmH20   Intake/Output Summary (Last 24 hours) at 11/18/2020 1049 Last data filed at 11/18/2020 0300 Gross per 24 hour  Intake 5397.64 ml  Output 3125 ml  Net 2272.64 ml   Filed Weights   11/11/20 0536 11/16/20 0500 11/17/20 0500  Weight: 74.9 kg 71.2 kg 71.4 kg   REVIEW OF SYSTEMS  PATIENT IS UNABLE TO PROVIDE COMPLETE REVIEW OF SYSTEMS DUE TO SEVERE CRITICAL ILLNESS AND TOXIC METABOLIC ENCEPHALOPATHY   PHYSICAL EXAMINATION:  GENERAL:critically ill appearing, +resp distress HEAD: Normocephalic, atraumatic.  EYES: Pupils equal, round, reactive to light.  No scleral icterus.  MOUTH: Moist mucosal membrane. NECK: Supple. No thyromegaly. No nodules. No JVD.  PULMONARY: +rhonchi, +wheezing CARDIOVASCULAR: S1 and S2. Regular rate and rhythm. No murmurs, rubs, or gallops.  GASTROINTESTINAL: Soft, nontender, -distended. Positive bowel sounds.  MUSCULOSKELETAL: No swelling, clubbing, or edema.  NEUROLOGIC: obtunded SKIN:intact,warm,dry      Labs/imaging that I  havepersonally reviewed  (right click and "Reselect all SmartList Selections" daily)    Labs   CBC: Recent Labs  Lab 11/14/20 0629 11/15/20 0509 11/16/20 0437 11/17/20 0332 11/18/20 0405  WBC 11.2* 12.4* 16.6* 6.8 7.8  NEUTROABS  --  11.2* 15.5*  --  7.4  HGB 14.2 14.3 13.2 10.6* 10.3*  HCT 41.4 42.8 38.8* 32.9* 32.1*  MCV 81.0 81.5 81.9 84.6 83.8  PLT 436* 407* 362 267 923    Basic Metabolic Panel: Recent Labs  Lab 11/14/20 0629 11/14/20 1732 11/15/20 0509 11/15/20 1719 11/16/20 0437 11/16/20 1019 11/17/20 0332 11/18/20 0405  NA 125*  --  124*  --  130*  --  127* 125*  K 4.3  --  5.4* 5.5* 6.6* 5.4* 5.0 4.4  CL 95*  --  93*  --  100  --  97* 96*  CO2 17*  --  21*  --  26  --  25 26  GLUCOSE 142*  --  235*  --  133*  --  237* 206*  BUN 19  --  43*  --  36*  --  22* 16  CREATININE 0.88  --  1.54* 2.33* 1.16  --  0.73 0.59*  CALCIUM 9.0  --  9.1  --  8.4*  --  8.1* 8.2*  MG  --    < > 2.8* 2.4 2.5*  --  2.1 1.8  PHOS  --    < > 6.5* 4.1 3.3  --  2.8 2.7   < > = values in this interval not displayed.   GFR: Estimated Creatinine Clearance: 113.6 mL/min (A) (by C-G formula based on SCr of 0.59 mg/dL (L)). Recent Labs  Lab 11/15/20 0509 11/15/20 1935 11/15/20 2248 11/16/20 0437 11/16/20 0441 11/17/20 0332 11/18/20 0405  WBC 12.4*  --   --  16.6*  --  6.8 7.8  LATICACIDVEN  --  3.3* 2.5*  --  1.9  --   --     Liver Function Tests: Recent Labs  Lab 11/15/20 0509 11/16/20 0437 11/17/20 0332 11/18/20 0405  AST 28 56* 36 54*  ALT 79* 94* 66* 73*  ALKPHOS 150* 150* 114 115  BILITOT 0.5 0.7 0.5 0.4  PROT 7.4 6.7 5.6* 5.7*  ALBUMIN 2.7* 2.3* 1.9* 1.9*    ABG    Component Value Date/Time   PHART 7.30 (L) 11/16/2020 0441   PCO2ART 50 (H) 11/16/2020 0441   PO2ART 187 (H) 11/16/2020 0441   HCO3 24.6 11/16/2020 0441   ACIDBASEDEF 2.3 (H) 11/16/2020 0441   O2SAT 99.6 11/16/2020 0441       ASSESSMENT & PLAN   Acute Respiratory Distress Syndrome   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    Severe ACUTE Hypoxic and Hypercapnic Respiratory Failure -continue Mechanical Ventilator support -continue Bronchodilator Therapy -Wean Fio2 and PEEP as tolerated -VAP/VENT bundle implementation Unable to wean from vent  -Initiate Lung protective strategies: PRVC  6-8 mL/kg, titrate down if peak pressure>30 mm Hg,   Sepsis with septic shock due to oppotunistics infection PJP, 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 + rhinovirus/enterovirus + syphilis + toxoplasmosis Ab -TOXOplasma reactivation-   Continue IV bactrim and Prednisone per ID recs -Continue vasopressors to maintain MAP> 65, norepinephrine if needed  ACUTE KIDNEY INJURY/Renal Failure -continue Foley Catheter-assess need -Avoid nephrotoxic agents -Follow urine output, BMP -Ensure adequate renal perfusion, optimize oxygenation -Renal dose medications    Newly diagnosed HIV/AIDS -CD4 is 7 and VL 1.5 million copies -Will start HAART after 7-10 days of PCP treatment per ID -ID following, input appreciated   Best practice (right click and "Reselect all SmartList Selections" daily)  Diet:  Tube Feed  Pain/Anxiety/Delirium protocol (if indicated): Yes (RASS goal -1) VAP protocol (if indicated): Yes DVT prophylaxis: Contraindicated GI prophylaxis: PPI Glucose control:  SSI Yes Central venous access:  Yes, and it is still needed Arterial line:  N/A Foley:  Yes, and it is still needed Mobility:  bed rest  PT consulted: N/A Last date of multidisciplinary goals of care discussion [6/5] Code Status:  full code Disposition: ICU    DVT/GI PRX  assessed I Assessed the need for Labs I Assessed the need for Foley I Assessed the need for Central Venous Line Family Discussion when available I Assessed the need for Mobilization I made an Assessment of medications to be adjusted accordingly Safety Risk assessment completed  CASE DISCUSSED IN  MULTIDISCIPLINARY ROUNDS WITH ICU TEAM     Critical Care Time devoted to patient care services described in this note is 55  minutes.  Critical care was necessary to treat /prevent imminent and life-threatening deterioration.  Overall, patient is critically ill, prognosis is guarded.  Patient with Multiorgan failure and at high risk for cardiac arrest and death.    Corrin Parker, M.D.  [facility]  Medical Director [facility] Director [facility] Cardio-Pulmonary Department

## 2020-11-18 NOTE — Progress Notes (Signed)
Date of Admission:  11/08/2020    ID: Jordan Gibson is a 40 y.o. male  Active Problems:   CAP (community acquired pneumonia)   Sepsis ([place])   Acute respiratory failure with hypoxia ([place])   Hyponatremia   Transaminitis   HIV positive ([facility])    Subjective: Intubated febrile  Medications:  . chlorhexidine gluconate (MEDLINE KIT)  15 mL Mouth Rinse BID  . [START ON 11/19/2020] Chlorhexidine Gluconate Cloth  6 each Topical q morning  . diazepam  1 mg Per Tube Q6H  . docusate  100 mg Per Tube BID  . enoxaparin (LOVENOX) injection  40 mg Subcutaneous Q24H  . feeding supplement (PROSource TF)  45 mL Per Tube BID  . free water  20 mL Per Tube Q4H  . insulin aspart  0-20 Units Subcutaneous Q4H  . insulin aspart  3 Units Subcutaneous Q4H  . mouth rinse  15 mL Mouth Rinse 10 times per day  . methylPREDNISolone (SOLU-MEDROL) injection  40 mg Intravenous Q12H  . oxyCODONE  5 mg Per Tube Q6H  . pantoprazole (PROTONIX) IV  40 mg Intravenous Daily  . penicillin g benzathine (BICILLIN-LA) IM  2.4 Million Units Intramuscular Weekly  . polyethylene glycol  17 g Per Tube Daily  . sodium zirconium cyclosilicate  10 g Per Tube BID    Objective: Vital signs in last 24 hours: Patient Vitals for the past 24 hrs:  BP Temp Temp src Pulse Resp SpO2  11/18/20 1800 111/65 -- -- 76 (!) 23 96 %  11/18/20 1700 108/68 -- -- 73 (!) 22 100 %  11/18/20 1600 105/65 98.9 F (37.2 C) Axillary 77 (!) 22 98 %  11/18/20 1500 105/63 98.6 F (37 C) Axillary 77 16 97 %  11/18/20 1400 106/64 -- -- 81 16 93 %  11/18/20 1355 -- -- -- 81 16 93 %  11/18/20 1300 106/63 -- -- 89 17 93 %  11/18/20 1200 125/70 (!) 102 F (38.9 C) Axillary (!) 107 (!) 28 94 %  11/18/20 1135 -- -- -- -- -- 93 %  11/18/20 1130 127/77 -- -- (!) 118 20 91 %  11/18/20 1100 (!) 170/90 -- -- (!) 121 (!) 25 (!) 89 %  11/18/20 1000 139/84 -- -- (!) 119 (!) 27 91 %  11/18/20 0900 115/81 -- -- 89 (!) 23 92 %  11/18/20 0800 114/64 98.4 F  (36.9 C) Axillary 91 17 90 %  11/18/20 0730 104/64 -- -- 86 (!) 27 92 %  11/18/20 0700 119/79 -- -- 94 19 90 %  11/18/20 0630 (!) 143/81 -- -- (!) 110 (!) 21 95 %  11/18/20 0600 123/76 -- -- 76 19 96 %  11/18/20 0530 125/79 -- -- 80 (!) 23 95 %  11/18/20 0500 107/61 -- -- 84 20 95 %  11/18/20 0430 106/67 -- -- 82 (!) 30 96 %  11/18/20 0400 118/73 98.2 F (36.8 C) Axillary 85 20 96 %  11/18/20 0330 113/74 -- -- 81 16 96 %  11/18/20 0300 113/73 -- -- 81 16 97 %  11/18/20 0230 113/76 -- -- 85 16 96 %  11/18/20 0200 108/61 -- -- 87 18 95 %  11/18/20 0130 119/69 -- -- 86 16 96 %  11/18/20 0118 -- -- -- -- -- 96 %  11/18/20 0100 117/68 -- -- 91 15 95 %  11/18/20 0030 125/68 -- -- 93 -- 95 %  11/18/20 0000 131/74 98.4 F (36.9 C) Axillary 87 --  96 %  11/17/20 2330 112/66 -- -- 87 -- 95 %  11/17/20 2300 113/65 -- -- 93 -- 92 %  11/17/20 2230 117/68 -- -- 99 -- 91 %  11/17/20 2200 (!) 143/92 -- -- (!) 115 -- 95 %  11/17/20 2131 (!) 146/89 -- -- (!) 125 -- 93 %  11/17/20 2100 120/71 -- -- 88 -- 96 %  11/17/20 2030 128/79 -- -- 99 -- 95 %  11/17/20 2021 -- -- -- -- -- 95 %  11/17/20 2000 106/63 98.8 F (37.1 C) Axillary 93 -- 93 %  11/17/20 1930 125/78 -- -- 95 (!) 29 95 %  11/17/20 1900 119/75 -- -- 94 (!) 26 96 %   PHYSICAL EXAM:  General: intubated  Lungs: b/l crepts Heart: Tachycardia Abdomen: Soft, non-tender,not distended. Bowel sounds normal. No masses Extremities: atraumatic, no cyanosis. No edema. No clubbing Skin: No rashes or lesions. Or bruising Lymph: Cervical, supraclavicular normal. Neurologic: Grossly non-focal  Lab Results Recent Labs    11/17/20 0332 11/17/20 0956 11/18/20 0405  WBC 6.8  --  7.8  HGB 10.6* 10.2* 10.3*  HCT 32.9*  --  32.1*  NA 127*  --  125*  K 5.0  --  4.4  CL 97*  --  96*  CO2 25  --  26  BUN 22*  --  16  CREATININE 0.73  --  0.59*   Liver Panel Recent Labs    11/17/20 0332 11/18/20 0405  PROT 5.6* 5.7*  ALBUMIN 1.9* 1.9*   AST 36 54*  ALT 66* 73*  ALKPHOS 114 115  BILITOT 0.5 0.4   Sedimentation Rate No results for input(s): ESRSEDRATE in the last 72 hours. C-Reactive Protein No results for input(s): CRP in the last 72 hours.  Microbiology:  Studies/Results: DG Chest Port 1 View  Result Date: 11/18/2020 CLINICAL DATA:  Pneumothorax.  Intubation. EXAM: PORTABLE CHEST 1 VIEW COMPARISON:  11/16/2020. FINDINGS: Endotracheal tube, NG tube, left IJ line in stable position. Heart size normal. Pneumomediastinum cannot be excluded. Diffuse severe bilateral pulmonary infiltrates/edema again noted. No pleural effusion. Skin fold noted over the left apex, no definite pneumothorax noted. Oral contrast in the stomach. Bilateral chest wall and neck subcutaneous emphysema again noted. IMPRESSION: 1.  Lines and tubes in stable position. 2. Pneumomediastinum cannot be excluded. Bilateral chest wall and neck subcutaneous emphysema again noted. Skin fold noted over the left apex. No definite pneumothorax noted. 3. Diffuse severe bilateral pulmonary infiltrates/edema again noted. Electronically Signed   By: Marcello Moores  Register   On: 11/18/2020 05:54   DG Chest Port 1 View  Result Date: 11/16/2020 CLINICAL DATA:  Acute respiratory failure EXAM: PORTABLE CHEST 1 VIEW COMPARISON:  Chest x-rays dated 11/15/2020 and 11/14/2020. FINDINGS: Heart size and mediastinal contours are stable. Endotracheal tube with tip at the level of the clavicles. LEFT IJ central line is stable in position with tip at the level of the upper SVC. Enteric tube passes below the diaphragm. Diffuse bilateral airspace opacities, not significantly changed compared to the recent chest x-rays. No pleural effusion is seen. Apparent subcutaneous emphysema overlying the LEFT chest and lower neck. IMPRESSION: 1. Subcutaneous emphysema overlying the LEFT chest wall and within the lower neck. This likely indicates underlying pneumothorax or pneumomediastinum. Consider chest CT for  further characterization. 2. Diffuse bilateral airspace opacities, not significantly changed compared to the recent chest x-rays, compatible with multifocal pneumonia. 3. Support apparatus appears adequately positioned. Critical Value/emergent results were called by telephone at the time  of interpretation on 11/16/2020 at 10:15 pm to provider BRITTON RUST-CHESTER , who verbally acknowledged these results. Electronically Signed   By: Franki Cabot M.D.   On: 11/16/2020 22:20     Assessment/Plan:  Newly diagnosed AIDS  Acute hypoxic respiratory failure due to newly diagnosed PCP  Fever- r/o secondary bacteria pneumonia esepcially with repeated intubated- r/o aspiration pneumonia Blood culture sent Will add zosyn No UTI  Watch closely for bactrim induced drug fever  Syphilis ( by positive serology- needs 3 doses of Benzathine penicllin once a week X 3 doses  Toxoplasma - high IgG)( bactrim will treat that as well)  Hyperkalemia resolved  Hyponatremia  Discussed the management with the care team

## 2020-11-18 NOTE — Consult Note (Signed)
Pharmacy Antibiotic Note  Jordan Gibson is a 40 y.o. male admitted on 11/11/2020 with acute respiratory failure. 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 Not on HAART. CD4 count 9, viral load 1.5 million. Patient being treated with Bactrim for PJP. Also tested positive for Rhinovirus/enterovirus, Syphilis, and Toxoplasma. ID is following. Patient is on IV Solu-medrol and receiving Penicillin G Benzathine for Syphilis infection.  Patient was intubated 5/28 - 5/30 and then re-intubated 6/3. Fever of 102 6/7 at 1200. Consult to add Zosyn.  Plan:  Start Zosyn 3.375 g IV q8h extended infusion  Continue Bactrim 62m/kg (dosed in trimethoprim) Q8H for total daily dose of 15 mg/kg/day  Monitor renal function and electrolytes (K+) closely   Height: _0  (170.2 cm) Weight: 71.4 kg (157 lb 6.5 oz) IBW/kg (Calculated) : 66.1  Temp (24hrs), Avg:99.1 F (37.3 C), Min:98.2 F (36.8 C), Max:102 F (38.9 C)  Recent Labs  Lab 11/14/20 0629 11/15/20 0509 11/15/20 1719 11/15/20 1935 11/15/20 2248 11/16/20 0437 11/16/20 0441 11/17/20 0332 11/18/20 0405  WBC 11.2* 12.4*  --   --   --  16.6*  --  6.8 7.8  CREATININE 0.88 1.54* 2.33*  --   --  1.16  --  0.73 0.59*  LATICACIDVEN  --   --   --  3.3* 2.5*  --  1.9  --   --     Estimated Creatinine Clearance: 113.6 mL/min (A) (by C-G formula based on SCr of 0.59 mg/dL (L)).    No Known Allergies  Antimicrobials this admission: 5/26 Azithromycin >> 5/31 5/26 Cefepime >> 5/30 5/27 Vancomycin > 5/28 5/31 Bactrim >>  6/3 Penicillin G Benzathine >> 6/7 Zosyn >>   Microbiology results: 6/3 PJP PCR: pending 6/3 Toxoplasma PCR: pending 6/3 Histoplasma urinary Ag: pending 6/3 BAL: pending 6/1 RPR: Reactive, titer 1:4, Ab (+) 6/1 Cryptococcus Ag: (-) 5/31 Hep B/C screen: (-) 5/31 Quantiferon-TB: (-) 5/31 Histoplasma Gal'man: pending 5/31 Fungitell: pending 5/28 Tracheal aspirate: Normal respiratory flora 5/28 Respiratory panel: (+)  Rhinovirus / enterovirus 5/28 SARS-CoV-2 / Influenza A / B: (-) 5/28: Mycoplasma Ab: (-) 5/27 Strep pneumo urinary Ag: (-) 5/27 Legionella urinary Ag: (-) 5/27 MRSA PCR: (-) 5/26 SARS-CoV-2 / Influenza A / B: (-) 5/26 Ucx: (-) 5/26 Bcx: NG  Thank you for allowing pharmacy to be a part of this patient's care.  ATawnya Crook PharmD Clinical Pharmacist  11/18/2020 3:27 PM

## 2020-11-19 ENCOUNTER — Inpatient Hospital Stay: Payer: Medicaid Other

## 2020-11-19 LAB — HLA B*5701: HLA B 5701: NEGATIVE

## 2020-11-19 LAB — BLOOD GAS, ARTERIAL
Acid-Base Excess: 1.7 mmol/L (ref 0.0–2.0)
Bicarbonate: 27.7 mmol/L (ref 20.0–28.0)
FIO2: 1
MECHVT: 500 mL
O2 Saturation: 95.6 %
PEEP: 10 cmH2O
Patient temperature: 37
RATE: 28 resp/min
pCO2 arterial: 49 mmHg — ABNORMAL HIGH (ref 32.0–48.0)
pH, Arterial: 7.36 (ref 7.350–7.450)
pO2, Arterial: 82 mmHg — ABNORMAL LOW (ref 83.0–108.0)

## 2020-11-19 LAB — COMPREHENSIVE METABOLIC PANEL
ALT: 110 U/L — ABNORMAL HIGH (ref 0–44)
AST: 63 U/L — ABNORMAL HIGH (ref 15–41)
Albumin: 1.9 g/dL — ABNORMAL LOW (ref 3.5–5.0)
Alkaline Phosphatase: 130 U/L — ABNORMAL HIGH (ref 38–126)
Anion gap: 8 (ref 5–15)
BUN: 13 mg/dL (ref 6–20)
CO2: 26 mmol/L (ref 22–32)
Calcium: 8.1 mg/dL — ABNORMAL LOW (ref 8.9–10.3)
Chloride: 92 mmol/L — ABNORMAL LOW (ref 98–111)
Creatinine, Ser: 0.67 mg/dL (ref 0.61–1.24)
GFR, Estimated: >60 mL/min (ref >60)
Glucose, Bld: 248 mg/dL — ABNORMAL HIGH (ref 70–99)
Potassium: 4.7 mmol/L (ref 3.5–5.1)
Sodium: 126 mmol/L — ABNORMAL LOW (ref 135–145)
Total Bilirubin: 0.3 mg/dL (ref 0.3–1.2)
Total Protein: 5.3 g/dL — ABNORMAL LOW (ref 6.5–8.1)

## 2020-11-19 LAB — CBC WITH DIFFERENTIAL/PLATELET
Abs Immature Granulocytes: 0.1 10*3/uL — ABNORMAL HIGH (ref 0.00–0.07)
Basophils Absolute: 0 10*3/uL (ref 0.0–0.1)
Basophils Relative: 0 %
Eosinophils Absolute: 0 10*3/uL (ref 0.0–0.5)
Eosinophils Relative: 0 %
HCT: 30 % — ABNORMAL LOW (ref 39.0–52.0)
Hemoglobin: 10 g/dL — ABNORMAL LOW (ref 13.0–17.0)
Immature Granulocytes: 1 %
Lymphocytes Relative: 3 %
Lymphs Abs: 0.2 10*3/uL — ABNORMAL LOW (ref 0.7–4.0)
MCH: 27.9 pg (ref 26.0–34.0)
MCHC: 33.3 g/dL (ref 30.0–36.0)
MCV: 83.6 fL (ref 80.0–100.0)
Monocytes Absolute: 0.2 10*3/uL (ref 0.1–1.0)
Monocytes Relative: 2 %
Neutro Abs: 6.9 10*3/uL (ref 1.7–7.7)
Neutrophils Relative %: 94 %
Platelets: 216 10*3/uL (ref 150–400)
RBC: 3.59 MIL/uL — ABNORMAL LOW (ref 4.22–5.81)
RDW: 13.8 % (ref 11.5–15.5)
WBC: 7.4 10*3/uL (ref 4.0–10.5)
nRBC: 0 % (ref 0.0–0.2)

## 2020-11-19 LAB — GLUCOSE, CAPILLARY
Glucose-Capillary: 181 mg/dL — ABNORMAL HIGH (ref 70–99)
Glucose-Capillary: 168 mg/dL — ABNORMAL HIGH (ref 70–99)
Glucose-Capillary: 120 mg/dL — ABNORMAL HIGH (ref 70–99)
Glucose-Capillary: 159 mg/dL — ABNORMAL HIGH (ref 70–99)
Glucose-Capillary: 174 mg/dL — ABNORMAL HIGH (ref 70–99)
Glucose-Capillary: 174 mg/dL — ABNORMAL HIGH (ref 70–99)

## 2020-11-19 MED ORDER — DIAZEPAM 5 MG PO TABS
5.0000 mg | ORAL_TABLET | Freq: Four times a day (QID) | ORAL | Status: DC
  Administered 2020-11-19 – 2020-11-23 (×17): 5 mg
  Filled 2020-11-19 (×17): qty 1

## 2020-11-19 MED ORDER — BISACODYL 10 MG RE SUPP
10.0000 mg | Freq: Once | RECTAL | Status: AC
  Administered 2020-11-19: 10 mg via RECTAL
  Filled 2020-11-19: qty 1

## 2020-11-19 MED ORDER — SODIUM CHLORIDE 0.9 % IV SOLN
100.0000 mg | INTRAVENOUS | Status: DC
  Administered 2020-11-20: 100 mg via INTRAVENOUS
  Filled 2020-11-19 (×2): qty 100

## 2020-11-19 MED ORDER — SODIUM CHLORIDE 0.9 % IV SOLN
200.0000 mg | Freq: Once | INTRAVENOUS | Status: AC
  Administered 2020-11-19: 200 mg via INTRAVENOUS
  Filled 2020-11-19: qty 200

## 2020-11-19 NOTE — Progress Notes (Signed)
Patient developed fever today to 103.5, ST 120-130s, with stable BP initially. Esophageal probe placed to closely monitor temp. PRN tylenol given and room temperature decreased to help reduce fever. ID updated on new fever, labs ordered and completed; Patient transported for chest CT. FiO2 had to be increased to 100% during transport for desaturations. Shortly after returning to unit, SBP 80-100s with MAPs 60-65. Levophed gtt restarted for SBP >90 and MAP > 65. ABG completed, decision to prone patient given PaO2 of 82 on FiO2 100%, patient tolerated without complications.

## 2020-11-19 NOTE — Progress Notes (Signed)
NAME:  Jordan Gibson, MRN:  [id number], DOB:  02-25-1981, LOS: 65 ADMISSION DATE:  10/18/2020  Jordan Gibson is a 40 y.o. Male with no significant past medical history who presented to [facility] ED on 11/07/20 with a 1 week history of shortness of breath, nonproductive cough, and fever. He was admitted to Acute Hypoxic Respiratory Failure and Sepsis in the setting of suspected Community Acquired Pneumonia.  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 Not on HAART. CD4 count 9, viral load 1.5 million. 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 Also tested positive for Rhinovirus/enterovirus, Syphilis, and Toxoplasma now complicated by ARDS requiring mechanical ventilation support.  Pertinent  Medical History  No significant past medical history  Significant Hospital Events: Including procedures, antibiotic start and stop dates in addition to other pertinent events   10/21/2020:Presented to ED, to be admitted to stepdown unit by hospitalist 11/07/2020: Increasing FiO2 requirements and worsening respiratory status, PCCM consulted due to high risk for deterioration and need for intubation. ABX broadened to Cefepime and Vancomycin, along with Azithromycin 11/08/20- patient with respiratory failure s/p ETT overnight. Vancomycin dcd today MRSA pcr negative.  11/09/20- Pt extubated to HFNC _0  AND 45%. Patient with severe ARDS. I met with family and reviewed care plan. +Rhino/ENtero virus 11/10/20- patient improved, has been extubated to HFNC. Tranferring to step down unit, signed out to [facility]. 11/12/20- FiO2 increased to 75%, dcd IV NS at 50/h, have aded lasix 20 bid, decreased prednisone to 40 once daily, stopped po vicodin. Metaneb ordered BID with RT for recruitment. Repeat CXR today 11/13/20: Transfferd back to ICU  Due to severe acute hypoxic respiratory failure 11/14/20: Failed HHFN and BiPAP requiring emergent re- intubation. s/p bedside Bronchoscopy 11/15/20: Patient continues to be hypoxic after increasing  FiO2 from 50% to 100% &PEEP from 10 to 12. ABG post these changes revealed a PF ratio of 100&respiratory acidosis. Proned 6/5 severe hypoxia, ARDS, AIDS, +renal failure 6/7 severe hypoxia, ALI  Cultures:  6/3: PJP PCR: pending 6/3: Toxoplasma PCR: pending 6/3: Histoplasma urinary Ag: pending 6/3: BAL: pending 6/1: RPR: Reactive, titer 1:4, Ab (+) 6/1: Cryptococcus Ag: (-) 5/31: Hep B/C screen: (-) 5/31: Quantiferon-TB: (-) 5/31: Histoplasma Gal[place]an: pending 5/31: Fungitell: >500 5/28:Mycoplasma pneumonia ab >>negative 5/28 Tracheal aspirate: Normal respiratory flora 5/28 Respiratory panel: (+) Rhinovirus / enterovirus 5/26: SARS-CoV-2 PCR>> negative 5/26: Influenza PCR>> negative 5/26: Blood culture x2>>No growth thus far 5/26: Urine Cx>>no growth 5/27: MRSA PCR>> negative 5/27:Strep pneumo urinary antigen>>negative 5/27:Legionella urinary antigen>>negative  Antimicrobials:  5/26 Azithromycin >> 5/31 5/26 Cefepime >> 5/30 5/27 Vancomycin > 5/28 5/31 Bactrim >> 6/3 Penicillin G Benzathine >> 6/4 Anidulafungin >>   Interim History / Subjective:  CXR reviewed b/l opacities +PJP pneumonia Severe ALI Remains critically ill Vent dyssynchrony Liver dysfunction noted   CMP Latest Ref Rng & Units 11/19/2020 11/18/2020 11/17/2020  Glucose 70 - 99 mg/dL 248(H) 206(H) 237(H)  BUN 6 - 20 mg/dL 13 16 22(H)  Creatinine 0.61 - 1.24 mg/dL 0.67 0.59(L) 0.73  Sodium 135 - 145 mmol/L 126(L) 125(L) 127(L)  Potassium 3.5 - 5.1 mmol/L 4.7 4.4 5.0  Chloride 98 - 111 mmol/L 92(L) 96(L) 97(L)  CO2 22 - 32 mmol/L _1 Calcium 8.9 - 10.3 mg/dL 8.1(L) 8.2(L) 8.1(L)  Total Protein 6.5 - 8.1 g/dL 5.3(L) 5.7(L) 5.6(L)  Total Bilirubin 0.3 - 1.2 mg/dL 0.3 0.4 0.5  Alkaline Phos 38 - 126 U/L 130(H) 115 114  AST 15 - 41 U/L 63(H) 54(H) 36  ALT 0 -  44 U/L 110(H) 73(H) 66(H)        Objective   Blood pressure 107/63, pulse 86, temperature 99.1 F (37.3 C), temperature source  Axillary, resp. rate (!) 22, height _0  (1.702 m), weight 71.8 kg, SpO2 93 %.    Vent Mode: PRVC FiO2 (%):  [50 %-60 %] 60 % Set Rate:  [20 bmp-28 bmp] 20 bmp Vt Set:  [500 mL] 500 mL PEEP:  [10 cmH20] 10 cmH20   Intake/Output Summary (Last 24 hours) at 11/19/2020 0744 Last data filed at 11/19/2020 0555 Gross per 24 hour  Intake 3873.68 ml  Output 5100 ml  Net -1226.32 ml   Filed Weights   11/16/20 0500 11/17/20 0500 11/19/20 0500  Weight: 71.2 kg 71.4 kg 71.8 kg      REVIEW OF SYSTEMS  PATIENT IS UNABLE TO PROVIDE COMPLETE REVIEW OF SYSTEMS DUE TO SEVERE CRITICAL ILLNESS AND TOXIC METABOLIC ENCEPHALOPATHY   PHYSICAL EXAMINATION:  GENERAL:critically ill appearing, +resp distress HEAD: Normocephalic, atraumatic.  EYES: Pupils equal, round, reactive to light.  No scleral icterus.  MOUTH: Moist mucosal membrane. NECK: Supple. PULMONARY: +rhonchi, +wheezing CARDIOVASCULAR: S1 and S2. Regular rate and rhythm. No murmurs, rubs, or gallops.  GASTROINTESTINAL: Soft, nontender, -distended. Positive bowel sounds.  MUSCULOSKELETAL: No swelling, clubbing, or edema.  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 leading to severe hypoxic resp failure intubated x 2 with severe b/l ling disease and ARDS/ALI transient renal failrue now ith elevated liver tests with liver dysfunction    Severe ACUTE Hypoxic and Hypercapnic Respiratory Failure -continue Mechanical Ventilator support -continue Bronchodilator Therapy -Wean Fio2 and PEEP as tolerated -VAP/VENT bundle implementation Unable to wean from vent  -Initiate Lung protective strategies: PRVC  6-8 mL/kg, titrate down if peak pressure>30 mm Hg,   Sepsis with septic shock due  to oppotunistics infection PJP, 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 + rhinovirus/enterovirus + syphilis + toxoplasmosis Ab Follow up ID recs    CARDIAC ICU monitoring   ACUTE KIDNEY INJURY/Renal Failure -continue Foley Catheter-assess need -Avoid nephrotoxic agents -Follow urine output, BMP -Ensure adequate renal perfusion, optimize oxygenation -Renal dose medications   NEUROLOGY Acute toxic metabolic encephalopathy, need for sedation Goal RASS -2 to -3     SEPTIC SHOCK -use vasopressors to keep MAP>65 as needed   INFECTIOUS DISEASE -continue antibiotics as prescribed -follow up cultures -follow up ID consultation Newly diagnosed HIV/AIDS -CD4 is 7 and VL 1.5 million copies -Will start HAART after 7-10 days of PCP treatment per ID   ENDO - ICU hypoglycemic\Hyperglycemia protocol -check FSBS per protocol   GI GI PROPHYLAXIS as indicated  NUTRITIONAL STATUS DIET-->TF's as tolerated Constipation protocol as indicated   ELECTROLYTES -follow labs as needed -replace as needed -pharmacy consultation and following   Best practice (right click and "Reselect all SmartList Selections" daily)  Diet:  Tube Feed  Pain/Anxiety/Delirium protocol (if indicated): Yes (RASS goal -1) VAP protocol (if indicated): Yes DVT prophylaxis: Contraindicated GI prophylaxis: PPI Glucose control:  SSI Yes Central venous access:  Yes, and it is still needed Arterial line:  N/A Foley:  Yes, and it is still needed Mobility:  bed rest  PT consulted: N/A Last date of multidisciplinary goals of care discussion [6/5] Code Status:  full code Disposition: ICU     Labs  CBC: Recent Labs  Lab 11/15/20 0509 11/16/20 0437 11/17/20 0332 11/17/20 0956 11/18/20 0405 11/19/20 0507  WBC 12.4* 16.6* 6.8  --  7.8 7.4  NEUTROABS 11.2* 15.5*  --   --  7.4 6.9  HGB 14.3 13.2 10.6* 10.2* 10.3* 10.0*  HCT 42.8 38.8* 32.9*  --  32.1* 30.0*  MCV 81.5 81.9 84.6  --  83.8 83.6  PLT 407*  362 267  --  222 096    Basic Metabolic Panel: Recent Labs  Lab 11/15/20 0509 11/15/20 1719 11/16/20 0437 11/16/20 1019 11/17/20 0332 11/18/20 0405 11/19/20 0507  NA 124*  --  130*  --  127* 125* 126*  K 5.4* 5.5* 6.6* 5.4* 5.0 4.4 4.7  CL 93*  --  100  --  97* 96* 92*  CO2 21*  --  26  --  _0 GLUCOSE 235*  --  133*  --  237* 206* 248*  BUN 43*  --  36*  --  22* 16 13  CREATININE 1.54* 2.33* 1.16  --  0.73 0.59* 0.67  CALCIUM 9.1  --  8.4*  --  8.1* 8.2* 8.1*  MG 2.8* 2.4 2.5*  --  2.1 1.8  --   PHOS 6.5* 4.1 3.3  --  2.8 2.7  --    GFR: Estimated Creatinine Clearance: 113.6 mL/min (by C-G formula based on SCr of 0.67 mg/dL). Recent Labs  Lab 11/15/20 1935 11/15/20 2248 11/16/20 0437 11/16/20 0441 11/17/20 0332 11/18/20 0405 11/19/20 0507  PROCALCITON  --   --   --   --   --  0.96  --   WBC  --   --  16.6*  --  6.8 7.8 7.4  LATICACIDVEN 3.3* 2.5*  --  1.9  --   --   --     Liver Function Tests: Recent Labs  Lab 11/15/20 0509 11/16/20 0437 11/17/20 0332 11/18/20 0405 11/19/20 0507  AST 28 56* 36 54* 63*  ALT 79* 94* 66* 73* 110*  ALKPHOS 150* 150* 114 115 130*  BILITOT 0.5 0.7 0.5 0.4 0.3  PROT 7.4 6.7 5.6* 5.7* 5.3*  ALBUMIN 2.7* 2.3* 1.9* 1.9* 1.9*   No results for input(s): LIPASE, AMYLASE in the last 168 hours. No results for input(s): AMMONIA in the last 168 hours.  ABG    Component Value Date/Time   PHART 7.30 (L) 11/16/2020 0441   PCO2ART 50 (H) 11/16/2020 0441   PO2ART 187 (H) 11/16/2020 0441   HCO3 24.6 11/16/2020 0441   ACIDBASEDEF 2.3 (H) 11/16/2020 0441   O2SAT 99.6 11/16/2020 0441     Coagulation Profile: No results for input(s): INR, PROTIME in the last 168 hours.  Cardiac Enzymes: No results for input(s): CKTOTAL, CKMB, CKMBINDEX, TROPONINI in the last 168 hours.  HbA1C: Hgb A1c MFr Bld  Date/Time Value Ref Range Status  11/12/2020 05:37 AM 5.9 (H) 4.8 - 5.6 % Final    Comment:    (NOTE)         Prediabetes: 5.7 -  6.4         Diabetes: >6.4         Glycemic control for adults with diabetes: <7.0     CBG: Recent Labs  Lab 11/18/20 1501 11/18/20 1923 11/18/20 2338 11/19/20 0336 11/19/20 0736  GLUCAP 135* 172* 161* 181* 168*    Allergies No Known Allergies     DVT/GI PRX  assessed I Assessed the need for Labs I Assessed the need  for Foley I Assessed the need for Central Venous Line Family Discussion when available I Assessed the need for Mobilization I made an Assessment of medications to be adjusted accordingly Safety Risk assessment completed  CASE DISCUSSED IN MULTIDISCIPLINARY ROUNDS WITH ICU TEAM     Critical Care Time devoted to patient care services described in this note is 50 minutes.  Critical care was necessary to treat or prevent imminent or life-threatening deterioration. Overall, patient is critically ill, prognosis is guarded.  Patient with Multiorgan failure and at high risk for cardiac arrest and death.    Corrin Parker, M.D.  [facility]  Medical Director [place] Director [facility] Cardio-Pulmonary Department

## 2020-11-19 NOTE — Progress Notes (Signed)
Date of Admission:  10/16/2020      ID: Jordan Gibson is a 40 y.o. male Active Problems:   CAP (community acquired pneumonia)   Sepsis ([place])   Acute respiratory failure with hypoxia ([place])   Hyponatremia   Transaminitis   HIV positive ([place])    Subjective: Critically ill Remains intubated  Medications:  . chlorhexidine gluconate (MEDLINE KIT)  15 mL Mouth Rinse BID  . Chlorhexidine Gluconate Cloth  6 each Topical q morning  . diazepam  5 mg Per Tube Q6H  . docusate  100 mg Per Tube BID  . enoxaparin (LOVENOX) injection  40 mg Subcutaneous Q24H  . feeding supplement (PROSource TF)  45 mL Per Tube BID  . free water  20 mL Per Tube Q4H  . insulin aspart  0-20 Units Subcutaneous Q4H  . insulin aspart  3 Units Subcutaneous Q4H  . mouth rinse  15 mL Mouth Rinse 10 times per day  . methylPREDNISolone (SOLU-MEDROL) injection  40 mg Intravenous Q12H  . oxyCODONE  5 mg Per Tube Q6H  . pantoprazole (PROTONIX) IV  40 mg Intravenous Daily  . penicillin g benzathine (BICILLIN-LA) IM  2.4 Million Units Intramuscular Weekly  . polyethylene glycol  17 g Per Tube Daily  . sodium zirconium cyclosilicate  10 g Per Tube BID    Objective: Vital signs in last 24 hours: Temp:  [96.9 F (36.1 C)-103.5 F (39.7 C)] 103.5 F (39.7 C) (06/08 1125) Pulse Rate:  [73-107] 103 (06/08 1100) Resp:  [15-32] 25 (06/08 1100) BP: (89-125)/(56-70) 111/69 (06/08 1100) SpO2:  [89 %-100 %] 100 % (06/08 1100) FiO2 (%):  [60 %] 60 % (06/08 1100) Weight:  [71.8 kg] 71.8 kg (06/08 0500)  PHYSICAL EXAM:  General: intubated and sedated  Lungs: crepts b/l Heart: Tachycardia Abdomen: Soft, non-tender,not distended. Bowel sounds normal. No masses Extremities: atraumatic, no cyanosis. No edema. No clubbing foley Skin: No rashes or lesions. Or bruising Lymph: Cervical, supraclavicular normal. Neurologic: cannot assess  Lab Results Recent Labs    11/18/20 0405 11/19/20 0507  WBC 7.8 7.4  HGB  10.3* 10.0*  HCT 32.1* 30.0*  NA 125* 126*  K 4.4 4.7  CL 96* 92*  CO2 26 26  BUN 16 13  CREATININE 0.59* 0.67   Liver Panel Recent Labs    11/18/20 0405 11/19/20 0507  PROT 5.7* 5.3*  ALBUMIN 1.9* 1.9*  AST 54* 63*  ALT 73* 110*  ALKPHOS 115 130*  BILITOT 0.4 0.3   Sedimentation Rate No results for input(s): ESRSEDRATE in the last 72 hours. C-Reactive Protein No results for input(s): CRP in the last 72 hours.  Microbiology: BC- NG Fungal antibody-Neg Histoplasma antigen neg Toxo IgG 286 Cryptococcus antigen neg HIV RNA 1.5 million copies PCP PCR pending  Beta D glucan > 500  Studies/Results: DG Chest Port 1 View  Result Date: 11/18/2020 CLINICAL DATA:  Pneumothorax.  Intubation. EXAM: PORTABLE CHEST 1 VIEW COMPARISON:  11/16/2020. FINDINGS: Endotracheal tube, NG tube, left IJ line in stable position. Heart size normal. Pneumomediastinum cannot be excluded. Diffuse severe bilateral pulmonary infiltrates/edema again noted. No pleural effusion. Skin fold noted over the left apex, no definite pneumothorax noted. Oral contrast in the stomach. Bilateral chest wall and neck subcutaneous emphysema again noted. IMPRESSION: 1.  Lines and tubes in stable position. 2. Pneumomediastinum cannot be excluded. Bilateral chest wall and neck subcutaneous emphysema again noted. Skin fold noted over the left apex. No definite pneumothorax noted. 3. Diffuse severe bilateral  pulmonary infiltrates/edema again noted. Electronically Signed   By: Marcello Moores  Register   On: 11/18/2020 05:54     Assessment/Plan:  AIDS-newly diagnosed  PJP with acute hypoxic resp failure s/p reintubated  Fever -D.D primary illness VS other opportunistic infection VS drug fever Will send OI workup  Continue Bactrim Zosyn Will add anidulafungin for fungal coverage  Hyponatremia  Hyperkalemia- resolved  AKI- resolved

## 2020-11-20 ENCOUNTER — Inpatient Hospital Stay: Payer: Medicaid Other

## 2020-11-20 LAB — CBC WITH DIFFERENTIAL/PLATELET
Abs Immature Granulocytes: 0.13 10*3/uL — ABNORMAL HIGH (ref 0.00–0.07)
Basophils Absolute: 0 10*3/uL (ref 0.0–0.1)
Basophils Relative: 0 %
Eosinophils Absolute: 0 10*3/uL (ref 0.0–0.5)
Eosinophils Relative: 0 %
HCT: 32.2 % — ABNORMAL LOW (ref 39.0–52.0)
Hemoglobin: 10.6 g/dL — ABNORMAL LOW (ref 13.0–17.0)
Immature Granulocytes: 2 %
Lymphocytes Relative: 3 %
Lymphs Abs: 0.2 10*3/uL — ABNORMAL LOW (ref 0.7–4.0)
MCH: 28.1 pg (ref 26.0–34.0)
MCHC: 32.9 g/dL (ref 30.0–36.0)
MCV: 85.4 fL (ref 80.0–100.0)
Monocytes Absolute: 0.2 10*3/uL (ref 0.1–1.0)
Monocytes Relative: 2 %
Neutro Abs: 7.6 10*3/uL (ref 1.7–7.7)
Neutrophils Relative %: 93 %
Platelets: 196 10*3/uL (ref 150–400)
RBC: 3.77 MIL/uL — ABNORMAL LOW (ref 4.22–5.81)
RDW: 13.9 % (ref 11.5–15.5)
WBC: 8.2 10*3/uL (ref 4.0–10.5)
nRBC: 0 % (ref 0.0–0.2)

## 2020-11-20 LAB — BASIC METABOLIC PANEL
Anion gap: 4 — ABNORMAL LOW (ref 5–15)
BUN: 12 mg/dL (ref 6–20)
CO2: 31 mmol/L (ref 22–32)
Calcium: 8.3 mg/dL — ABNORMAL LOW (ref 8.9–10.3)
Chloride: 95 mmol/L — ABNORMAL LOW (ref 98–111)
Creatinine, Ser: 0.51 mg/dL — ABNORMAL LOW (ref 0.61–1.24)
GFR, Estimated: >60 mL/min (ref >60)
Glucose, Bld: 116 mg/dL — ABNORMAL HIGH (ref 70–99)
Potassium: 4.7 mmol/L (ref 3.5–5.1)
Sodium: 130 mmol/L — ABNORMAL LOW (ref 135–145)

## 2020-11-20 LAB — LACTATE DEHYDROGENASE: LDH: 279 U/L — ABNORMAL HIGH (ref 98–192)

## 2020-11-20 LAB — MISC LABCORP TEST (SEND OUT): Labcorp test code: 139490

## 2020-11-20 LAB — GLUCOSE, CAPILLARY
Glucose-Capillary: 109 mg/dL — ABNORMAL HIGH (ref 70–99)
Glucose-Capillary: 112 mg/dL — ABNORMAL HIGH (ref 70–99)
Glucose-Capillary: 154 mg/dL — ABNORMAL HIGH (ref 70–99)
Glucose-Capillary: 170 mg/dL — ABNORMAL HIGH (ref 70–99)
Glucose-Capillary: 208 mg/dL — ABNORMAL HIGH (ref 70–99)
Glucose-Capillary: 94 mg/dL (ref 70–99)

## 2020-11-20 LAB — FERRITIN: Ferritin: 1468 ng/mL — ABNORMAL HIGH (ref 24–336)

## 2020-11-20 LAB — MAGNESIUM: Magnesium: 2.4 mg/dL (ref 1.7–2.4)

## 2020-11-20 MED ORDER — METOPROLOL TARTRATE 5 MG/5ML IV SOLN: 5.0000 mg | INTRAVENOUS | Status: AC

## 2020-11-20 NOTE — Progress Notes (Signed)
Date of Admission:  10/15/2020  Bactrim - 5/31>> Zosyn 6/7>> Benzathine penicillin 2.4 MU on 11/14/20      ID: Jordan Gibson is a 40 y.o. male Active Problems:   CAP (community acquired pneumonia)   Sepsis ([place])   Acute respiratory failure with hypoxia ([place])   Hyponatremia   Transaminitis   HIV positive ([facility])    Subjective: Intubated     BP (!) 95/55   Pulse 85   Temp 100.04 F (37.8 C)   Resp (!) 28   Ht '5\' 7"'  (1.702 m)   Wt 71.8 kg   SpO2 91%   BMI 24.79 kg/m    Medications:   chlorhexidine gluconate (MEDLINE KIT)  15 mL Mouth Rinse BID   Chlorhexidine Gluconate Cloth  6 each Topical q morning   diazepam  5 mg Per Tube Q6H   docusate  100 mg Per Tube BID   enoxaparin (LOVENOX) injection  40 mg Subcutaneous Q24H   feeding supplement (PROSource TF)  45 mL Per Tube BID   free water  20 mL Per Tube Q4H   insulin aspart  0-20 Units Subcutaneous Q4H   insulin aspart  3 Units Subcutaneous Q4H   mouth rinse  15 mL Mouth Rinse 10 times per day   methylPREDNISolone (SOLU-MEDROL) injection  40 mg Intravenous Q12H   oxyCODONE  5 mg Per Tube Q6H   pantoprazole (PROTONIX) IV  40 mg Intravenous Daily   penicillin g benzathine (BICILLIN-LA) IM  2.4 Million Units Intramuscular Weekly   polyethylene glycol  17 g Per Tube Daily   sodium zirconium cyclosilicate  10 g Per Tube BID    Objective: Vital signs in last 24 hours: Temp:  [96.62 F (35.9 C)-101.84 F (38.8 C)] 100.04 F (37.8 C) (06/09 1430) Pulse Rate:  [66-91] 85 (06/09 1430) Resp:  [13-29] 28 (06/09 1430) BP: (91-134)/(51-81) 95/55 (06/09 1430) SpO2:  [89 %-100 %] 91 % (06/09 1430) FiO2 (%):  [50 %-100 %] 50 % (06/09 1331)  PHYSICAL EXAM:  General: intubated /sedated Lungs: b/l air entry Heart: Tachycardia Abdomen: Soft,  Extremities: atraumatic, no cyanosis. No edema. No clubbing Skin: No rashes or lesions. Or bruising Lymph: Cervical, supraclavicular normal. Neurologic: cannot assess Lab  Results CBC Latest Ref Rng & Units 11/20/2020 11/19/2020 11/18/2020  WBC 4.0 - 10.5 K/uL 8.2 7.4 7.8  Hemoglobin 13.0 - 17.0 g/dL 10.6(L) 10.0(L) 10.3(L)  Hematocrit 39.0 - 52.0 % 32.2(L) 30.0(L) 32.1(L)  Platelets 150 - 400 K/uL 196 216 222    Recent Labs    11/19/20 0507 11/20/20 0250  WBC 7.4 8.2  HGB 10.0* 10.6*  HCT 30.0* 32.2*  NA 126* 130*  K 4.7 4.7  CL 92* 95*  CO2 26 31  BUN 13 12  CREATININE 0.67 0.51*   Liver Panel Recent Labs    11/18/20 0405 11/19/20 0507  PROT 5.7* 5.3*  ALBUMIN 1.9* 1.9*  AST 54* 63*  ALT 73* 110*  ALKPHOS 115 130*  BILITOT 0.4 0.3  Microbiology: BC- NG Fungal antibody-Neg Histoplasma antigen neg Toxo IgG 286 Cryptococcus antigen neg HIV RNA 1.5 million copies PCP PCR pending Beta D glucan > 500 RPR 1:4 TPA positive  Studies/Results: CT CHEST WO CONTRAST  Result Date: 11/19/2020 CLINICAL DATA:  Shortness of breath, cough. EXAM: CT CHEST WITHOUT CONTRAST TECHNIQUE: Multidetector CT imaging of the chest was performed following the standard protocol without IV contrast. COMPARISON:  Nov 08, 2020. FINDINGS: Cardiovascular: No significant vascular findings. Normal heart size.  No pericardial effusion. Mediastinum/Nodes: Tracheostomy tube is in good position. Nasogastric tube is seen passing through esophagus into stomach. No adenopathy is noted. Thyroid gland is unremarkable. Pneumomediastinum is noted anteriorly. Subcutaneous emphysema is also noted in both supraclavicular regions. Lungs/Pleura: Continued presence of diffuse bilateral airspace opacities consistent with multifocal pneumonia. Minimal right pleural effusion may be present. No definite pneumothorax is noted. Upper Abdomen: No acute abnormality. Musculoskeletal: No chest wall mass or suspicious bone lesions identified. IMPRESSION: Continued presence of diffuse bilateral lung opacities consistent with multifocal pneumonia. Minimal right pleural effusion may be present. Interval  development of pneumomediastinum as well as bilateral supraclavicular subcutaneous emphysema. Electronically Signed   By: Marijo Conception M.D.   On: 11/19/2020 14:51   US Venous Img Lower Bilateral (DVT)  Result Date: 11/20/2020 CLINICAL DATA:  40 year old male with bilateral lower extremity swelling. EXAM: BILATERAL LOWER EXTREMITY VENOUS DOPPLER ULTRASOUND TECHNIQUE: Gray-scale sonography with graded compression, as well as color Doppler and duplex ultrasound were performed to evaluate the lower extremity deep venous systems from the level of the common femoral vein and including the common femoral, femoral, profunda femoral, popliteal and calf veins including the posterior tibial, peroneal and gastrocnemius veins when visible. The superficial great saphenous vein was also interrogated. Spectral Doppler was utilized to evaluate flow at rest and with distal augmentation maneuvers in the common femoral, femoral and popliteal veins. COMPARISON:  None. FINDINGS: RIGHT LOWER EXTREMITY Common Femoral Vein: No evidence of thrombus. Normal compressibility, respiratory phasicity and response to augmentation. Saphenofemoral Junction: No evidence of thrombus. Normal compressibility and flow on color Doppler imaging. Profunda Femoral Vein: No evidence of thrombus. Normal compressibility and flow on color Doppler imaging. Femoral Vein: No evidence of thrombus. Normal compressibility, respiratory phasicity and response to augmentation. Popliteal Vein: No evidence of thrombus. Normal compressibility, respiratory phasicity and response to augmentation. Calf Veins: No evidence of thrombus. Normal compressibility and flow on color Doppler imaging. Other Findings:  None. LEFT LOWER EXTREMITY Common Femoral Vein: No evidence of thrombus. Normal compressibility, respiratory phasicity and response to augmentation. Saphenofemoral Junction: No evidence of thrombus. Normal compressibility and flow on color Doppler imaging. Profunda  Femoral Vein: No evidence of thrombus. Normal compressibility and flow on color Doppler imaging. Femoral Vein: No evidence of thrombus. Normal compressibility, respiratory phasicity and response to augmentation. Popliteal Vein: No evidence of thrombus. Normal compressibility, respiratory phasicity and response to augmentation. Calf Veins: No evidence of thrombus. Normal compressibility and flow on color Doppler imaging. Other Findings:  None. IMPRESSION: No evidence of bilateral lower extremity deep vein thrombosis. Ruthann Cancer, MD [facility]iology Electronically Signed   By: Ruthann Cancer MD   On: 11/20/2020 12:05     Assessment/Plan:  AIDS-newly diagnosed   PJP with acute hypoxic resp failure - reintubated  Rhino virus in resp specimen  Fever -D.D primary illness VS other opportunistic infection VS [place] heiximer ? drug fever OI workup sent - pending DC zosyn and hold the next dose of benzathine penicillin   Continue Bactrim Continue anidulafungin for another 24 hrs to see whether any improvement to fever  Toxoplasmosis- being treated with IV bactrim  Syphilis- got first dose of penicillin 2 more doses due- will postpone te 2nd dose to see whether the fever would resolve   Hyponatremia   Hyperkalemia- resolved   AKI- resolved  Discussed the management with the care team

## 2020-11-20 NOTE — Progress Notes (Signed)
NAME:  Jordan Gibson, MRN:  [id number], DOB:  [birth year], LOS: 19 ADMISSION DATE:  10/24/2020 Jordan Gibson is a 40 y.o. Male with no significant past medical history who presented to [facility] ED on 11/07/20 with a 1 week history of shortness of breath, nonproductive cough, and fever. He was admitted to Acute Hypoxic Respiratory Failure and Sepsis in the setting of suspected Community Acquired Pneumonia.    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 Not on HAART. CD4 count 9, viral load 1.5 million. 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 Also tested positive for Rhinovirus/enterovirus, Syphilis, and Toxoplasma now complicated by ARDS requiring mechanical ventilation support.   Pertinent  Medical History  No significant past medical history   Significant Hospital Events: Including procedures, antibiotic start and stop dates in addition to other pertinent events  10/19/2020: Presented to ED, to be admitted to stepdown unit by hospitalist 11/07/2020: Increasing FiO2 requirements and worsening respiratory status, PCCM consulted due to high risk for deterioration and need for intubation. ABX broadened to Cefepime and Vancomycin, along with Azithromycin 11/08/20- patient with respiratory failure s/p ETT overnight. Vancomycin dcd today MRSA pcr negative. 11/09/20- Pt extubated to HFNC _0  AND 45%.  Patient with severe ARDS. I met with family and reviewed care plan. +Rhino/ENtero virus 11/10/20- patient improved, has been extubated to HFNC.  Tranferring to step down unit, signed out to [facility]. 11/12/20- FiO2 increased to 75%, dcd IV NS at 50/h, have aded lasix 20 bid, decreased prednisone to 40 once daily, stopped po vicodin.  Metaneb ordered BID with RT for recruitment.  Repeat CXR today 11/13/20: Transfferd back to ICU  Due to severe acute hypoxic respiratory failure 11/14/20: Failed HHFN and BiPAP requiring emergent re- intubation. s/p bedside Bronchoscopy 11/15/20: Patient continues to be hypoxic after increasing  FiO2 from 50% to 100% & PEEP from 10 to 12. ABG post these changes revealed a PF ratio of 100 & respiratory acidosis. Proned 6/5 severe hypoxia, ARDS, AIDS, +renal failure 6/7 severe hypoxia, ALI 6/8 severe hypoxia, ARDS initiated PRONING 6/9 SEVERE HYPOXIA    Cultures:  6/3: PJP PCR: pending 6/3: Toxoplasma PCR: pending 6/3: Histoplasma urinary Ag: pending 6/3: BAL: pending 6/1: RPR: Reactive, titer 1:4, Ab (+) 6/1: Cryptococcus Ag: (-) 5/31: Hep B/C screen: (-) 5/31: Quantiferon-TB: (-) 5/31: Histoplasma Gal'man: pending 5/31: Fungitell: >500 5/28:Mycoplasma pneumonia ab >>negative 5/28 Tracheal aspirate: Normal respiratory flora 5/28 Respiratory panel: (+) Rhinovirus / enterovirus 5/26: SARS-CoV-2 PCR>> negative 5/26: Influenza PCR>> negative 5/26: Blood culture x2>>No growth thus far 5/26: Urine Cx>>no growth 5/27: MRSA PCR>> negative 5/27:Strep pneumo urinary antigen>>negative 5/27:Legionella urinary antigen>>negative   Antimicrobials:  5/26 Azithromycin >> 5/31 5/26 Cefepime >> 5/30 5/27 Vancomycin > 5/28 5/31 Bactrim >>  6/3 Penicillin G Benzathine >> 6/4 Anidulafungin >>    Antibiotics Given (last 72 hours)     Date/Time Action Medication Dose Rate   11/17/20 1103 New Bag/Given   sulfamethoxazole-trimethoprim (BACTRIM) 375 mg of trimethoprim in dextrose 5 % 500 mL IVPB 375 mg of trimethoprim 349 mL/hr   11/17/20 2029 New Bag/Given   sulfamethoxazole-trimethoprim (BACTRIM) 375 mg of trimethoprim in dextrose 5 % 500 mL IVPB 375 mg of trimethoprim 349 mL/hr   11/18/20 0443 New Bag/Given   sulfamethoxazole-trimethoprim (BACTRIM) 375 mg of trimethoprim in dextrose 5 % 500 mL IVPB 375 mg of trimethoprim 349 mL/hr   11/18/20 1115 New Bag/Given   sulfamethoxazole-trimethoprim (BACTRIM) 375 mg of trimethoprim in dextrose 5 % 500 mL IVPB 375 mg of trimethoprim 349 mL/hr  11/18/20 1601 New Bag/Given   piperacillin-tazobactam (ZOSYN) IVPB 3.375 g 3.375 g 12.5 mL/hr    11/18/20 2012 New Bag/Given   sulfamethoxazole-trimethoprim (BACTRIM) 375 mg of trimethoprim in dextrose 5 % 500 mL IVPB 375 mg of trimethoprim 349 mL/hr   11/18/20 2151 New Bag/Given   piperacillin-tazobactam (ZOSYN) IVPB 3.375 g 3.375 g 12.5 mL/hr   11/19/20 0414 New Bag/Given   sulfamethoxazole-trimethoprim (BACTRIM) 375 mg of trimethoprim in dextrose 5 % 500 mL IVPB 375 mg of trimethoprim 349 mL/hr   11/19/20 0554 New Bag/Given   piperacillin-tazobactam (ZOSYN) IVPB 3.375 g 3.375 g 12.5 mL/hr   11/19/20 1206 New Bag/Given   sulfamethoxazole-trimethoprim (BACTRIM) 375 mg of trimethoprim in dextrose 5 % 500 mL IVPB 375 mg of trimethoprim 349 mL/hr   11/19/20 1511 New Bag/Given   piperacillin-tazobactam (ZOSYN) IVPB 3.375 g 3.375 g 12.5 mL/hr   11/19/20 1940 New Bag/Given   sulfamethoxazole-trimethoprim (BACTRIM) 375 mg of trimethoprim in dextrose 5 % 500 mL IVPB 375 mg of trimethoprim 349 mL/hr   11/19/20 2256 New Bag/Given   piperacillin-tazobactam (ZOSYN) IVPB 3.375 g 3.375 g 12.5 mL/hr   11/20/20 0436 New Bag/Given   sulfamethoxazole-trimethoprim (BACTRIM) 375 mg of trimethoprim in dextrose 5 % 500 mL IVPB 375 mg of trimethoprim 349 mL/hr   11/20/20 0615 New Bag/Given   piperacillin-tazobactam (ZOSYN) IVPB 3.375 g 3.375 g 12.5 mL/hr        Interim History / Subjective:  SEVERE HYPOXIA SEVERE ARDS REMAINS CRITICALLY ILL MULTIPLE INFECTIONS       Objective   Blood pressure (!) 99/55, pulse 70, temperature 98.96 F (37.2 C), temperature source Rectal, resp. rate (!) 25, height _0  (1.702 m), weight 71.8 kg, SpO2 95 %.    Vent Mode: PRVC FiO2 (%):  [60 %-100 %] 100 % Set Rate:  [20 bmp-28 bmp] 28 bmp Vt Set:  [500 mL] 500 mL PEEP:  [10 cmH20] 10 cmH20 Plateau Pressure:  [28 cmH20] 28 cmH20   Intake/Output Summary (Last 24 hours) at 11/20/2020 0756 Last data filed at 11/20/2020 0615 Gross per 24 hour  Intake 5454.31 ml  Output 5200 ml  Net 254.31 ml   Filed Weights    11/16/20 0500 11/17/20 0500 11/19/20 0500  Weight: 71.2 kg 71.4 kg 71.8 kg      REVIEW OF SYSTEMS  PATIENT IS UNABLE TO PROVIDE COMPLETE REVIEW OF SYSTEMS DUE TO SEVERE CRITICAL ILLNESS AND TOXIC METABOLIC ENCEPHALOPATHY   PHYSICAL EXAMINATION:  GENERAL:critically ill appearing, +resp distress HEAD: Normocephalic, atraumatic.  EYES: Pupils equal, round, reactive to light.  No scleral icterus.  MOUTH: Moist mucosal membrane. NECK: Supple. PULMONARY: +rhonchi, +wheezing CARDIOVASCULAR: S1 and S2. Regular rate and rhythm. No murmurs, rubs, or gallops.  GASTROINTESTINAL: Soft, nontender, -distended. Positive bowel sounds.  MUSCULOSKELETAL: No swelling, clubbing, or edema.  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 leading to severe hypoxic resp failure intubated x 2 with severe b/l ling disease and ARDS/ALI transient renal failrue now ith elevated liver tests with liver dysfunction +PCP PNEUMONIA +TOXO +SYPHILIS +RHINOVIRUS    Severe ACUTE Hypoxic and Hypercapnic Respiratory Failure -continue Mechanical Ventilator support -continue Bronchodilator Therapy -Wean Fio2 and PEEP as tolerated -VAP/VENT bundle implementation SEVERE ARDS LUNG PROTECTIVE STRATEGY ARDS NET PROTOCOL   CARDIAC ICU monitoring   ACUTE KIDNEY  INJURY/Renal Failure -continue Foley Catheter-assess need -Avoid nephrotoxic agents -Follow urine output, BMP -Ensure adequate renal perfusion, optimize oxygenation -Renal dose medications   NEUROLOGY Acute toxic metabolic encephalopathy, need for sedation Goal RASS -2 to -3   SEPTIC SHOCK -use vasopressors to keep MAP>65 as needed -follow ABG and LA -follow up cultures  INFECTIOUS  DISEASE -continue antibiotics as prescribed -follow up cultures -follow up ID consultation Newly diagnosed HIV/AIDS -CD4 is 7 and VL 1.5 million copies -Will start HAART after 7-10 days of PCP treatment per ID   ENDO - ICU hypoglycemic\Hyperglycemia protocol -check FSBS per protocol   GI GI PROPHYLAXIS as indicated  NUTRITIONAL STATUS DIET-->TF's as tolerated Constipation protocol as indicated   ELECTROLYTES -follow labs as needed -replace as needed -pharmacy consultation and following    Best practice (right click and "Reselect all SmartList Selections" daily)  Diet:  Tube Feed Pain/Anxiety/Delirium protocol (if indicated): Yes (RASS goal -1) VAP protocol (if indicated): Yes DVT prophylaxis: Contraindicated GI prophylaxis: PPI Glucose control:  SSI Yes Central venous access:  Yes, and it is still needed Arterial line:  N/A Foley:  Yes, and it is still needed Mobility:  bed rest  PT consulted: N/A Last date of multidisciplinary goals of care discussion [6/5] Code Status:  full code Disposition: ICU    Labs   CBC: Recent Labs  Lab 11/15/20 0509 11/16/20 0437 11/17/20 0332 11/17/20 0956 11/18/20 0405 11/19/20 0507 11/20/20 0250  WBC 12.4* 16.6* 6.8  --  7.8 7.4 8.2  NEUTROABS 11.2* 15.5*  --   --  7.4 6.9 7.6  HGB 14.3 13.2 10.6* 10.2* 10.3* 10.0* 10.6*  HCT 42.8 38.8* 32.9*  --  32.1* 30.0* 32.2*  MCV 81.5 81.9 84.6  --  83.8 83.6 85.4  PLT 407* 362 267  --  222 216 098    Basic Metabolic Panel: Recent Labs  Lab 11/15/20 0509 11/15/20 1719 11/16/20 0437 11/16/20 1019 11/17/20 0332 11/18/20 0405 11/19/20 0507 11/20/20 0250  NA 124*  --  130*  --  127* 125* 126* 130*  K 5.4* 5.5* 6.6* 5.4* 5.0 4.4 4.7 4.7  CL 93*  --  100  --  97* 96* 92* 95*  CO2 21*  --  26  --  _0 GLUCOSE 235*  --  133*  --  237* 206* 248* 116*  BUN 43*  --  36*  --  22* _1 CREATININE 1.54* 2.33* 1.16  --  0.73 0.59* 0.67 0.51*  CALCIUM 9.1  --  8.4*   --  8.1* 8.2* 8.1* 8.3*  MG 2.8* 2.4 2.5*  --  2.1 1.8  --  2.4  PHOS 6.5* 4.1 3.3  --  2.8 2.7  --   --    GFR: Estimated Creatinine Clearance: 113.6 mL/min (A) (by C-G formula based on SCr of 0.51 mg/dL (L)). Recent Labs  Lab 11/15/20 1935 11/15/20 2248 11/16/20 0437 11/16/20 0441 11/17/20 0332 11/18/20 0405 11/19/20 0507 11/20/20 0250  PROCALCITON  --   --   --   --   --  0.96  --   --   WBC  --   --    < >  --  6.8 7.8 7.4 8.2  LATICACIDVEN 3.3* 2.5*  --  1.9  --   --   --   --    < > = values in this interval not displayed.    Liver Function Tests: Recent Labs  Lab 11/15/20  4037 11/16/20 0437 11/17/20 0332 11/18/20 0405 11/19/20 0507  AST 28 56* 36 54* 63*  ALT 79* 94* 66* 73* 110*  ALKPHOS 150* 150* 114 115 130*  BILITOT 0.5 0.7 0.5 0.4 0.3  PROT 7.4 6.7 5.6* 5.7* 5.3*  ALBUMIN 2.7* 2.3* 1.9* 1.9* 1.9*   No results for input(s): LIPASE, AMYLASE in the last 168 hours. No results for input(s): AMMONIA in the last 168 hours.  ABG    Component Value Date/Time   PHART 7.40 11/20/2020 0500   PCO2ART 52 (H) 11/20/2020 0500   PO2ART 105 11/20/2020 0500   HCO3 32.2 (H) 11/20/2020 0500   ACIDBASEDEF 2.3 (H) 11/16/2020 0441   O2SAT 98.0 11/20/2020 0500     Coagulation Profile: No results for input(s): INR, PROTIME in the last 168 hours.  Cardiac Enzymes: No results for input(s): CKTOTAL, CKMB, CKMBINDEX, TROPONINI in the last 168 hours.  HbA1C: Hgb A1c MFr Bld  Date/Time Value Ref Range Status  11/12/2020 05:37 AM 5.9 (H) 4.8 - 5.6 % Final    Comment:    (NOTE)         Prediabetes: 5.7 - 6.4         Diabetes: >6.4         Glycemic control for adults with diabetes: <7.0     CBG: Recent Labs  Lab 11/19/20 1539 11/19/20 1933 11/19/20 2307 11/20/20 0343 11/20/20 0735  GLUCAP 159* 174* 174* 112* 109*    Allergies No Known Allergies     DVT/GI PRX  assessed I Assessed the need for Labs I Assessed the need for Foley I Assessed the need for  Central Venous Line Family Discussion when available I Assessed the need for Mobilization I made an Assessment of medications to be adjusted accordingly Safety Risk assessment completed  CASE DISCUSSED IN MULTIDISCIPLINARY ROUNDS WITH ICU TEAM     Critical Care Time devoted to patient care services described in this note is  65 minutes.  Critical care was necessary to treat or prevent imminent or life-threatening deterioration. Overall, patient is critically ill, prognosis is guarded.  Patient with Multiorgan failure and at high risk for cardiac arrest and death.    Corrin Parker, M.D.  [facility]  Medical Director [place] Director [facility] Cardio-Pulmonary Department

## 2020-11-21 ENCOUNTER — Inpatient Hospital Stay: Payer: Medicaid Other

## 2020-11-21 DIAGNOSIS — F1610 Hallucinogen abuse, uncomplicated: Secondary | ICD-10-CM

## 2020-11-21 LAB — CBC WITH DIFFERENTIAL/PLATELET
Abs Immature Granulocytes: 0.09 10*3/uL — ABNORMAL HIGH (ref 0.00–0.07)
Basophils Absolute: 0 10*3/uL (ref 0.0–0.1)
Basophils Relative: 0 %
Eosinophils Absolute: 0.1 10*3/uL (ref 0.0–0.5)
Eosinophils Relative: 1 %
HCT: 29.5 % — ABNORMAL LOW (ref 39.0–52.0)
Hemoglobin: 9.7 g/dL — ABNORMAL LOW (ref 13.0–17.0)
Immature Granulocytes: 1 %
Lymphocytes Relative: 3 %
Lymphs Abs: 0.2 10*3/uL — ABNORMAL LOW (ref 0.7–4.0)
MCH: 28 pg (ref 26.0–34.0)
MCHC: 32.9 g/dL (ref 30.0–36.0)
MCV: 85 fL (ref 80.0–100.0)
Monocytes Absolute: 0.1 10*3/uL (ref 0.1–1.0)
Monocytes Relative: 2 %
Neutro Abs: 7.4 10*3/uL (ref 1.7–7.7)
Neutrophils Relative %: 93 %
Platelets: 198 10*3/uL (ref 150–400)
RBC: 3.47 MIL/uL — ABNORMAL LOW (ref 4.22–5.81)
RDW: 14.3 % (ref 11.5–15.5)
WBC: 8 10*3/uL (ref 4.0–10.5)
nRBC: 0 % (ref 0.0–0.2)

## 2020-11-21 LAB — BASIC METABOLIC PANEL
Anion gap: 8 (ref 5–15)
BUN: 13 mg/dL (ref 6–20)
CO2: 29 mmol/L (ref 22–32)
Calcium: 8 mg/dL — ABNORMAL LOW (ref 8.9–10.3)
Chloride: 92 mmol/L — ABNORMAL LOW (ref 98–111)
Creatinine, Ser: 0.56 mg/dL — ABNORMAL LOW (ref 0.61–1.24)
GFR, Estimated: >60 mL/min (ref >60)
Glucose, Bld: 265 mg/dL — ABNORMAL HIGH (ref 70–99)
Potassium: 4.3 mmol/L (ref 3.5–5.1)
Sodium: 129 mmol/L — ABNORMAL LOW (ref 135–145)

## 2020-11-21 LAB — HEPATIC FUNCTION PANEL
ALT: 139 U/L — ABNORMAL HIGH (ref 0–44)
AST: 73 U/L — ABNORMAL HIGH (ref 15–41)
Albumin: 1.7 g/dL — ABNORMAL LOW (ref 3.5–5.0)
Alkaline Phosphatase: 149 U/L — ABNORMAL HIGH (ref 38–126)
Bilirubin, Direct: <0.1 mg/dL (ref 0.0–0.2)
Total Bilirubin: 0.4 mg/dL (ref 0.3–1.2)
Total Protein: 5.1 g/dL — ABNORMAL LOW (ref 6.5–8.1)

## 2020-11-21 LAB — TOXOPLASMA GONDII, PCR: Toxoplasma Gondii, PCR: NEGATIVE

## 2020-11-21 LAB — GLUCOSE, CAPILLARY
Glucose-Capillary: 208 mg/dL — ABNORMAL HIGH (ref 70–99)
Glucose-Capillary: 196 mg/dL — ABNORMAL HIGH (ref 70–99)
Glucose-Capillary: 150 mg/dL — ABNORMAL HIGH (ref 70–99)
Glucose-Capillary: 170 mg/dL — ABNORMAL HIGH (ref 70–99)
Glucose-Capillary: 147 mg/dL — ABNORMAL HIGH (ref 70–99)
Glucose-Capillary: 178 mg/dL — ABNORMAL HIGH (ref 70–99)

## 2020-11-21 LAB — MRSA PCR SCREENING: MRSA by PCR: NEGATIVE

## 2020-11-21 LAB — BLOOD GAS, ARTERIAL
Acid-Base Excess: 5 mmol/L — ABNORMAL HIGH (ref 0.0–2.0)
Bicarbonate: 31.6 mmol/L — ABNORMAL HIGH (ref 20.0–28.0)
FIO2: 1
MECHVT: 500 mL
Mechanical Rate: 28
O2 Saturation: 96 %
PEEP: 10 cmH2O
Patient temperature: 37
pCO2 arterial: 56 mmHg — ABNORMAL HIGH (ref 32.0–48.0)
pH, Arterial: 7.36 (ref 7.350–7.450)
pO2, Arterial: 85 mmHg (ref 83.0–108.0)

## 2020-11-21 LAB — PHOSPHORUS: Phosphorus: 3.7 mg/dL (ref 2.5–4.6)

## 2020-11-21 LAB — MAGNESIUM: Magnesium: 2.1 mg/dL (ref 1.7–2.4)

## 2020-11-21 LAB — PNEUMOCYSTIS PCR: Result Pneumocystis PCR: POSITIVE — AB

## 2020-11-21 LAB — CMV DNA, QUANTITATIVE, PCR
CMV DNA Quant: >1000000 IU/mL
Log10 CMV Qn DNA Pl: UNDETERMINED log10 IU/mL

## 2020-11-21 MED ORDER — ATOVAQUONE 750 MG/5ML PO SUSP
1500.0000 mg | Freq: Two times a day (BID) | ORAL | Status: DC
  Administered 2020-11-21 (×2): 1500 mg
  Filled 2020-11-21 (×3): qty 10

## 2020-11-21 MED ORDER — ATOVAQUONE 750 MG/5ML PO SUSP
750.0000 mg | Freq: Two times a day (BID) | ORAL | Status: DC
  Filled 2020-11-21 (×2): qty 5

## 2020-11-21 MED ORDER — SODIUM CHLORIDE 0.9 % IV SOLN
100.0000 mg | Freq: Two times a day (BID) | INTRAVENOUS | Status: DC
  Administered 2020-11-21 – 2020-11-22 (×3): 100 mg via INTRAVENOUS
  Filled 2020-11-21 (×5): qty 100

## 2020-11-21 MED ORDER — BISACODYL 10 MG RE SUPP
10.0000 mg | Freq: Once | RECTAL | Status: AC
  Administered 2020-11-21: 10 mg via RECTAL
  Filled 2020-11-21: qty 1

## 2020-11-21 MED ORDER — SODIUM ZIRCONIUM CYCLOSILICATE 5 G PO PACK: 10.0000 g | PACK | Freq: Every day | ORAL | Status: DC

## 2020-11-21 MED ORDER — CLINDAMYCIN PHOSPHATE 900 MG/50ML IV SOLN
900.0000 mg | Freq: Three times a day (TID) | INTRAVENOUS | Status: DC
  Administered 2020-11-21 – 2020-11-22 (×3): 900 mg via INTRAVENOUS
  Filled 2020-11-21 (×5): qty 50

## 2020-11-21 MED ORDER — PRIMAQUINE PHOSPHATE 26.3 (15 BASE) MG PO TABS
30.0000 mg | ORAL_TABLET | Freq: Every day | ORAL | Status: DC
  Administered 2020-11-21: 30 mg
  Filled 2020-11-21 (×2): qty 2

## 2020-11-21 MED ORDER — SODIUM CHLORIDE 0.9 % IV SOLN
5.0000 mg/kg | Freq: Two times a day (BID) | INTRAVENOUS | Status: DC
  Administered 2020-11-21 – 2020-12-02 (×21): 375 mg via INTRAVENOUS
  Filled 2020-11-21 (×24): qty 7.5

## 2020-11-21 NOTE — TOC Progression Note (Signed)
Transition of Care [facility]) - Progression Note    Patient Details  Name: Jordan Gibson MRN: [id number] Date of Birth: [birth year]  [facility] CM/SW Contact  Joseph Art, [place] Phone Number: 11/21/2020, 5:19 PM  Clinical Narrative:     Patient remains critical ill.  Family has been updated.  Patient remains FULL CODE.       Expected Discharge Plan and Services                                                 Social Determinants of Health (SDOH) Interventions    Readmission Risk Interventions No flowsheet data found.

## 2020-11-21 NOTE — Progress Notes (Signed)
Patient stacking breaths, dyssynchronous with vent. PRN Vecuronium given as ordered. Synchrony improved. Will continue to monitor.

## 2020-11-21 NOTE — Progress Notes (Signed)
Date of Admission:  11/05/2020    Bactrim - 5/31>> Zosyn 6/7>>6/9 Benzathine penicillin 2.4 MU on 11/14/20 anidulafungin  ID: Jordan Gibson is a 40 y.o. male    Subjective:   Medications:   chlorhexidine gluconate (MEDLINE KIT)  15 mL Mouth Rinse BID   Chlorhexidine Gluconate Cloth  6 each Topical q morning   diazepam  5 mg Per Tube Q6H   docusate  100 mg Per Tube BID   enoxaparin (LOVENOX) injection  40 mg Subcutaneous Q24H   feeding supplement (PROSource TF)  45 mL Per Tube BID   free water  20 mL Per Tube Q4H   insulin aspart  0-20 Units Subcutaneous Q4H   insulin aspart  3 Units Subcutaneous Q4H   mouth rinse  15 mL Mouth Rinse 10 times per day   methylPREDNISolone (SOLU-MEDROL) injection  40 mg Intravenous Q12H   metoprolol tartrate  5 mg Intravenous STAT   oxyCODONE  5 mg Per Tube Q6H   pantoprazole (PROTONIX) IV  40 mg Intravenous Daily   polyethylene glycol  17 g Per Tube Daily   [START ON 11/22/2020] sodium zirconium cyclosilicate  10 g Per Tube Daily    Objective: Vital signs in last 24 hours: Temp:  [97.52 F (36.4 C)-101.84 F (38.8 C)] 98.6 F (37 C) (06/10 0930) Pulse Rate:  [68-137] 81 (06/10 0930) Resp:  [13-32] 28 (06/10 0930) BP: (94-180)/(55-116) 97/60 (06/10 0930) SpO2:  [88 %-100 %] 96 % (06/10 0930) FiO2 (%):  [50 %-100 %] 80 % (06/10 0800) Weight:  [75 kg] 75 kg (06/10 0357)  PHYSICAL EXAM:  General: Intubated and sedated Lungs: Cb/l air entry- crepts Heart: tachycardia Abdomen: Soft, non-tender,not distended. Bowel sounds normal. No masses Extremities: atraumatic, no cyanosis. No edema. No clubbing Skin: No rashes or lesions. Or bruising Lymph: Cervical, supraclavicular normal. Neurologic: cannot assess  Lab Results Recent Labs    11/20/20 0250 11/21/20 0511  WBC 8.2 8.0  HGB 10.6* 9.7*  HCT 32.2* 29.5*  NA 130* 129*  K 4.7 4.3  CL 95* 92*  CO2 31 29  BUN 12 13  CREATININE 0.51* 0.56*   Liver Panel Recent Labs     11/19/20 0507  PROT 5.3*  ALBUMIN 1.9*  AST 63*  ALT 110*  ALKPHOS 130*  BILITOT 0.3   Sedimentation Rate No results for input(s): ESRSEDRATE in the last 72 hours. C-Reactive Protein No results for input(s): CRP in the last 72 hours.  Microbiology: BC- NG Fungal antibody-Neg Histoplasma antigen neg Toxo IgG 286 Cryptococcus antigen neg HIV RNA 1.5 million copies PCP PCR  POSITIVE in BAL Beta D glucan > 500 RPR 1:4 TPA positive EBV PCR 4400- Not significant  AFB blood culture sent on 11/19/20 AFB BAL 11/14/20 still pending  Studies/Results: CT CHEST WO CONTRAST  Result Date: 11/19/2020 CLINICAL DATA:  Shortness of breath, cough. EXAM: CT CHEST WITHOUT CONTRAST TECHNIQUE: Multidetector CT imaging of the chest was performed following the standard protocol without IV contrast. COMPARISON:  Nov 08, 2020. FINDINGS: Cardiovascular: No significant vascular findings. Normal heart size. No pericardial effusion. Mediastinum/Nodes: Tracheostomy tube is in good position. Nasogastric tube is seen passing through esophagus into stomach. No adenopathy is noted. Thyroid gland is unremarkable. Pneumomediastinum is noted anteriorly. Subcutaneous emphysema is also noted in both supraclavicular regions. Lungs/Pleura: Continued presence of diffuse bilateral airspace opacities consistent with multifocal pneumonia. Minimal right pleural effusion may be present. No definite pneumothorax is noted. Upper Abdomen: No acute abnormality. Musculoskeletal: No chest  wall mass or suspicious bone lesions identified. IMPRESSION: Continued presence of diffuse bilateral lung opacities consistent with multifocal pneumonia. Minimal right pleural effusion may be present. Interval development of pneumomediastinum as well as bilateral supraclavicular subcutaneous emphysema. Electronically Signed   By: Marijo Conception M.D.   On: 11/19/2020 14:51   US Venous Img Lower Bilateral (DVT)  Result Date: 11/20/2020 CLINICAL DATA:   40 year old male with bilateral lower extremity swelling. EXAM: BILATERAL LOWER EXTREMITY VENOUS DOPPLER ULTRASOUND TECHNIQUE: Gray-scale sonography with graded compression, as well as color Doppler and duplex ultrasound were performed to evaluate the lower extremity deep venous systems from the level of the common femoral vein and including the common femoral, femoral, profunda femoral, popliteal and calf veins including the posterior tibial, peroneal and gastrocnemius veins when visible. The superficial great saphenous vein was also interrogated. Spectral Doppler was utilized to evaluate flow at rest and with distal augmentation maneuvers in the common femoral, femoral and popliteal veins. COMPARISON:  None. FINDINGS: RIGHT LOWER EXTREMITY Common Femoral Vein: No evidence of thrombus. Normal compressibility, respiratory phasicity and response to augmentation. Saphenofemoral Junction: No evidence of thrombus. Normal compressibility and flow on color Doppler imaging. Profunda Femoral Vein: No evidence of thrombus. Normal compressibility and flow on color Doppler imaging. Femoral Vein: No evidence of thrombus. Normal compressibility, respiratory phasicity and response to augmentation. Popliteal Vein: No evidence of thrombus. Normal compressibility, respiratory phasicity and response to augmentation. Calf Veins: No evidence of thrombus. Normal compressibility and flow on color Doppler imaging. Other Findings:  None. LEFT LOWER EXTREMITY Common Femoral Vein: No evidence of thrombus. Normal compressibility, respiratory phasicity and response to augmentation. Saphenofemoral Junction: No evidence of thrombus. Normal compressibility and flow on color Doppler imaging. Profunda Femoral Vein: No evidence of thrombus. Normal compressibility and flow on color Doppler imaging. Femoral Vein: No evidence of thrombus. Normal compressibility, respiratory phasicity and response to augmentation. Popliteal Vein: No evidence of  thrombus. Normal compressibility, respiratory phasicity and response to augmentation. Calf Veins: No evidence of thrombus. Normal compressibility and flow on color Doppler imaging. Other Findings:  None. IMPRESSION: No evidence of bilateral lower extremity deep vein thrombosis. Ruthann Cancer, MD [facility] Electronically Signed   By: Ruthann Cancer MD   On: 11/20/2020 12:05   DG Chest Port 1 View  Result Date: 11/21/2020 CLINICAL DATA:  Acute respiratory failure EXAM: PORTABLE CHEST 1 VIEW COMPARISON:  11/18/2020 FINDINGS: Endotracheal tube is seen 2.2 cm above the carina. Nasogastric tube extends into the upper abdomen beyond the margin of the examination. Left internal jugular central venous catheter tip overlies the proximal superior vena cava. Pulmonary insufflation is stable. Superimposed diffuse airspace infiltrate appears stable when accounting for changes in radiographic technique, more focal within the lung bases bilaterally, in keeping with multifocal pneumonic infiltrate or ARDS. No pneumothorax or pleural effusion. Subcutaneous gas noted at the left neck base. Cardiac size within normal limits. No acute bone abnormality. IMPRESSION: Stable support lines and tubes. Stable pulmonary insufflation. Stable widespread slightly asymmetric airspace infiltrate, infection versus ARDS. Known pneumomediastinum is not well appreciated on this examination. Electronically Signed   By: Fidela Salisbury MD   On: 11/21/2020 01:33     Assessment/Plan:   Acute hypoxic respiratory failure- due to PJP pneumonia- remains intubated  Day 11 of Bactrim IV + solumedrol Continues to need 80% Fio2, also having fever. So the concern is whether this is treatment failure , drug fever For the above 2 reasons will discontinue bactrim  and use alternate treatment - primaquine + clindamycin and observe over weekend The D.D is he could have concurrent CMV infection lung  /systemic that could keep his respiratory status staus quo and also give fever. CMV PCR in blood pending If it comes back significantly positive then would treat.  Toxoplasma IgG  highly positive indicates reactivation. Clinically he did not have any CNS toxo . Bactrim was also being used for primary prophylaxis but will now do atovaquone for PP.  AIDS- newly diagnosed- Cd4 7- not started HAARt yet- plan was to start 10-14 days after PCP therapy in order to prevent IRIS. 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 So will have to treat like late latent. Received 1 dose of Benzathine penicillin. Will do second dose next week  EBV DNA of 4400 is not significant-could be just from the immune suppression and steroids  will closely observe Mild transmininitis- neg hepatitis viral profile Sent for HSV, CMV EBV DNA miminally elevated Not signficant enough   Discussed the diagnosis and prognosis / management with his sister by using hospital spanish interpreter.  Discussed the management with intensivist and his nurse

## 2020-11-21 NOTE — Progress Notes (Signed)
FIO2 up to 100% due to desat

## 2020-11-21 NOTE — Progress Notes (Signed)
Patient remains intubated and sedated. Attempted to wean FiO2 down to 70%, only tolerated for ~3 hours and ultimately had to be increased back to 100% after concerns for cuff leak around 1800 and SpO2 87-89% that did not improve with FiO2 80 or 90%. RT and MD at bedside evaluating. CXR ordered and completed to confirm ETT placement.   Patient on continuous precedex, dilaudid, and versed infusion for sedation, and requiring intermittent PRN doses of versed. PRN Vecuronium given x3 for vent dyssynchrony. Tylenol given x1 for developing fever. Tmax 100.2, resolved with treatment. See MAR for antibiotic changes. Family updated with assistance from spanish interpreter.

## 2020-11-21 NOTE — Progress Notes (Signed)
Pt noted to have CMV Quat DNA PCR >1,000,000, therefore will start Ganciclovir 5 mg/kg/dose every 12hrs.  Dr. Francene Boyers agreed with plan of care as outlined above.  Will continue to monitor and assess pt.  Camie Patience, AGNP  Pulmonary/Critical Care Pager [phone] (please enter 7 digits) PCCM Consult Pager [phone] (please enter 7 digits)

## 2020-11-21 NOTE — Progress Notes (Signed)
Inpatient Diabetes Program Recommendations  AACE/ADA: New Consensus Statement on Inpatient Glycemic Control (2015)  Target Ranges:  Prepandial:   less than 140 mg/dL      Peak postprandial:   less than 180 mg/dL (1-2 hours)      Critically ill patients:  140 - 180 mg/dL   Lab Results  Component Value Date   GLUCAP 196 (H) 11/21/2020   HGBA1C 5.9 (H) 11/12/2020    Review of Glycemic Control  Diabetes history: None Outpatient Diabetes medications: None Current orders for Inpatient glycemic control: Novolog 0-20 units Q4H + 3 units Q4H. On Solumedrol 40 BID TFs at 60/H.  Inpatient Diabetes Program Recommendations:    Consider increasing TF coverage to 4 units Q4H. HOLD if TF held for any reason. May need low-dose basal if FBS > 180 mg/dL.  Follow closely.  Thank you. Ailene Ards, RD, LDN, CDE Inpatient Diabetes Coordinator [phone]

## 2020-11-21 NOTE — Progress Notes (Signed)
NAME:  Jordan Gibson, MRN:  [id number], DOB:  02/01/1981, LOS: 81 ADMISSION DATE:  10/21/2020  Brief History of Present Illness   Jordan Gibson is a 40 y.o. Male with no significant past medical history who presented to [facility] ED on 11/07/20 with a 1 week history of shortness of breath, nonproductive cough, and fever. He was admitted to Acute Hypoxic Respiratory Failure and Sepsis in the setting of suspected Community Acquired Pneumonia.    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 Not on HAART. CD4 count 9, viral load 1.5 million. 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 Also tested positive for Rhinovirus/enterovirus, Syphilis, and Toxoplasma now complicated by ARDS requiring mechanical ventilation support.   Pertinent  Medical History  No significant past medical history   Significant Hospital Events: Including procedures, antibiotic start and stop dates in addition to other pertinent events  10/27/2020: Presented to ED, to be admitted to stepdown unit by hospitalist 11/07/2020: Increasing FiO2 requirements and worsening respiratory status, PCCM consulted due to high risk for deterioration and need for intubation. ABX broadened to Cefepime and Vancomycin, along with Azithromycin 11/08/20- patient with respiratory failure s/p ETT overnight. Vancomycin dcd today MRSA pcr negative. 11/09/20- Pt extubated to HFNC _0  AND 45%.  Patient with severe ARDS. I met with family and reviewed care plan. +Rhino/ENtero virus 11/10/20- patient improved, has been extubated to HFNC.  Tranferring to step down unit, signed out to [facility]. 11/12/20- FiO2 increased to 75%, dcd IV NS at 50/h, have aded lasix 20 bid, decreased prednisone to 40 once daily, stopped po vicodin.  Metaneb ordered BID with RT for recruitment.  Repeat CXR today 11/13/20: Transfferd back to ICU  Due to severe acute hypoxic respiratory failure 11/14/20: Failed HHFN and BiPAP requiring emergent re- intubation. s/p bedside Bronchoscopy 11/15/20: Patient  continues to be hypoxic after increasing FiO2 from 50% to 100% & PEEP from 10 to 12. ABG post these changes revealed a PF ratio of 100 & respiratory acidosis. Proned 6/5 severe hypoxia, ARDS, AIDS, +renal failure 6/7 severe hypoxia, ALI 6/8 severe hypoxia, ARDS initiated PRONING 6/9 SEVERE HYPOXIA  6/10: Severe hypoxia, remains critically ill   Cultures:  6/7: Blood culture x2>> 6/3: PJP PCR:  Positive 6/3: Toxoplasma PCR: negative 6/3: Histoplasma urinary Ag: <0.5 6/3: BAL:  Normal respiratory flora 6/1: RPR: Reactive, titer 1:4, Ab (+) 6/1: Cryptococcus Ag: (-) 5/31: Hep B/C screen: (-) 5/31: Quantiferon-TB: (-) 5/31: Histoplasma Gal[place]an: <0.5 5/31: Fungitell: >500 5/28:Mycoplasma pneumonia ab >>negative 5/28 Tracheal aspirate: Normal respiratory flora 5/28 Respiratory panel: (+) Rhinovirus / enterovirus 5/26: SARS-CoV-2 PCR>> negative 5/26: Influenza PCR>> negative 5/26: Blood culture x2>>No growth thus far 5/26: Urine Cx>>no growth 5/27: MRSA PCR>> negative 5/27:Strep pneumo urinary antigen>>negative 5/27:Legionella urinary antigen>>negative    Antimicrobials:  5/26 Azithromycin >> 5/31 5/26 Cefepime >> 5/30 5/27 Vancomycin > 5/28 5/31 Bactrim >>  6/3 Penicillin G Benzathine >> 6/4 Anidulafungin >>  6/7 Zosyn>>6/9   Antibiotics Given (last 72 hours)     Date/Time Action Medication Dose Rate   11/18/20 1601 New Bag/Given   piperacillin-tazobactam (ZOSYN) IVPB 3.375 g 3.375 g 12.5 mL/hr   11/18/20 2012 New Bag/Given   sulfamethoxazole-trimethoprim (BACTRIM) 375 mg of trimethoprim in dextrose 5 % 500 mL IVPB 375 mg of trimethoprim 349 mL/hr   11/18/20 2151 New Bag/Given   piperacillin-tazobactam (ZOSYN) IVPB 3.375 g 3.375 g 12.5 mL/hr   11/19/20 0414 New Bag/Given   sulfamethoxazole-trimethoprim (BACTRIM) 375 mg of trimethoprim in dextrose 5 % 500 mL IVPB 375 mg of  trimethoprim 349 mL/hr   11/19/20 0554 New Bag/Given   piperacillin-tazobactam (ZOSYN) IVPB 3.375 g  3.375 g 12.5 mL/hr   11/19/20 1206 New Bag/Given   sulfamethoxazole-trimethoprim (BACTRIM) 375 mg of trimethoprim in dextrose 5 % 500 mL IVPB 375 mg of trimethoprim 349 mL/hr   11/19/20 1511 New Bag/Given   piperacillin-tazobactam (ZOSYN) IVPB 3.375 g 3.375 g 12.5 mL/hr   11/19/20 1940 New Bag/Given   sulfamethoxazole-trimethoprim (BACTRIM) 375 mg of trimethoprim in dextrose 5 % 500 mL IVPB 375 mg of trimethoprim 349 mL/hr   11/19/20 2256 New Bag/Given   piperacillin-tazobactam (ZOSYN) IVPB 3.375 g 3.375 g 12.5 mL/hr   11/20/20 0436 New Bag/Given   sulfamethoxazole-trimethoprim (BACTRIM) 375 mg of trimethoprim in dextrose 5 % 500 mL IVPB 375 mg of trimethoprim 349 mL/hr   11/20/20 0615 New Bag/Given   piperacillin-tazobactam (ZOSYN) IVPB 3.375 g 3.375 g 12.5 mL/hr   11/20/20 1210 New Bag/Given   sulfamethoxazole-trimethoprim (BACTRIM) 375 mg of trimethoprim in dextrose 5 % 500 mL IVPB 375 mg of trimethoprim 349 mL/hr   11/20/20 1412 New Bag/Given   piperacillin-tazobactam (ZOSYN) IVPB 3.375 g 3.375 g 12.5 mL/hr   11/20/20 2114 New Bag/Given  [another abt infusing]   sulfamethoxazole-trimethoprim (BACTRIM) 375 mg of trimethoprim in dextrose 5 % 500 mL IVPB 375 mg of trimethoprim 349 mL/hr   11/21/20 0356 New Bag/Given   sulfamethoxazole-trimethoprim (BACTRIM) 375 mg of trimethoprim in dextrose 5 % 500 mL IVPB 375 mg of trimethoprim 349 mL/hr   11/21/20 1158 New Bag/Given   sulfamethoxazole-trimethoprim (BACTRIM) 375 mg of trimethoprim in dextrose 5 % 500 mL IVPB 375 mg of trimethoprim 349 mL/hr        Interim History / Subjective:  Remains critically ill, + fever Severe hypoxia, requiring 80% FiO2 on vent Sedated with Precedex and Dilaudid Requiring 1 mcg Levophed   Objective   Blood pressure 97/60, pulse 81, temperature 98.6 F (37 C), resp. rate (!) 28, height _0  (1.702 m), weight 75 kg, SpO2 96 %.    Vent Mode: PRVC FiO2 (%):  [50 %-100 %] 80 % Set Rate:  [28 bmp] 28  bmp Vt Set:  [500 mL] 500 mL PEEP:  [10 cmH20] 10 cmH20   Intake/Output Summary (Last 24 hours) at 11/21/2020 1239 Last data filed at 11/21/2020 0900 Gross per 24 hour  Intake 5085.1 ml  Output 3775 ml  Net 1310.1 ml    Filed Weights   11/17/20 0500 11/19/20 0500 11/21/20 0357  Weight: 71.4 kg 71.8 kg 75 kg      REVIEW OF SYSTEMS  PATIENT IS UNABLE TO PROVIDE COMPLETE REVIEW OF SYSTEMS DUE TO SEVERE CRITICAL ILLNESS AND TOXIC METABOLIC ENCEPHALOPATHY   PHYSICAL EXAMINATION:  GENERAL: Critically ill-appearing male, laying in bed, intubated and sedated, no acute distress HEAD: Normocephalic, atraumatic.  EYES: Pupils equal, round, reactive to light.  No scleral icterus.  MOUTH: Moist mucosal membrane.  ET tube in place NECK: Supple, no JVD PULMONARY: Coarse breath sounds bilaterally, no wheezing, overbreathing the vent, even CARDIOVASCULAR: S1 and S2. Regular rate and rhythm. No murmurs, rubs, or gallops.  GASTROINTESTINAL: Soft, nontender, -distended. Positive bowel sounds.  MUSCULOSKELETAL: No swelling, clubbing, or edema.  NEUROLOGIC: Heavily sedated due to ARDS, does not follow commands, pupils PERRLA SKIN: Warm and diaphoretic.  No obvious rashes, lesions, ulceration   Labs/imaging that I havepersonally reviewed  (right click and "Reselect all SmartList Selections" daily)  Labs 11/21/20: Na 129, glucose 265, BUN 13, Cr. 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 leading to severe hypoxic resp failure intubated x 2 with severe b/l ling disease and ARDS/ALI transient renal failrue now ith elevated liver tests with liver dysfunction +PCP PNEUMONIA +TOXO +SYPHILIS +RHINOVIRUS  Assessment / Plan:   Acute Hypoxic and Hypercapnic Respiratory Failure due to PJP Pneumonia Severe ARDS -Full vent support, implement lung protective  strategies via ARDS Net Protocol -Wean FiO2 and PEEP as tolerated maintain O2 sats greater than 90% -Follow intermittent chest x-ray and ABG as needed -Spontaneous breathing trials when respiratory parameters met -Implement VAP bundle -As needed bronchodilators -Continue antibiotics  Septic Shock -Continuous cardiac monitoring -Maintain MAP greater than 65 -IV fluids -Vasopressors as needed to maintain MAP goal -Echocardiogram on 11/08/20 with LVEF 56-97%, normal diastolic parameters, normal RV systolic function  Severe Sepsis due to PJP Pneumonia Newly diagnosed AIDS Persistent Fever -Monitor fever curve -Trend WBCs -Follow cultures -ID following, antibiotics as per ID -Currently on day 11 of Bactrim + Solu-Medrol ~due to persistent fevers there is concern for possible treatment failure versus drug fever~per ID will discontinue Bactrim and treat with primaquine + clindamycin -CD4 is 7 and VL 1.5 million copies -Will start HAART after 7-10 days of PCP treatment per ID  Hyponatremia -Monitor I&O's / urinary output -Follow BMP -Ensure adequate renal perfusion -Avoid nephrotoxic agents as able -Replace electrolytes as indicated  Mild Transaminitis -Negative viral hepatitis panel -Trend LFT's  Acute toxic metabolic encephalopathy, need for sedation -Goal RASS -2 to -3 given severe ARDS -Dilaudid & Versed, and Precedex as needed to maintain RASS goal -Avoid sedating meds as able -Daily wake up assessment -Provide supportive care   Hyperglycemia -CBG's -SSI -Follow ICU Hypo/Hyperglycemia protocol   Pt is critically ill.  Prognosis is guarded. High risk for cardiac arrest and death.  Best practice (right click and "Reselect all SmartList Selections" daily)  Diet:  Tube Feed Pain/Anxiety/Delirium protocol (if indicated): Yes (RASS goal -2 to -3) VAP protocol (if indicated): Yes DVT prophylaxis: Lovenox GI prophylaxis: PPI Glucose control:  SSI Yes Central venous  access:  Yes, and it is still needed Arterial line:  N/A Foley:  Yes, and it is still needed Mobility:  bed rest  PT consulted: N/A Last date of multidisciplinary goals of care discussion [11/21/20] Code Status:  full code Disposition: ICU    Labs   CBC: Recent Labs  Lab 11/16/20 0437 11/17/20 0332 11/17/20 0956 11/18/20 0405 11/19/20 0507 11/20/20 0250 11/21/20 0511  WBC 16.6* 6.8  --  7.8 7.4 8.2 8.0  NEUTROABS 15.5*  --   --  7.4 6.9 7.6 7.4  HGB 13.2 10.6* 10.2* 10.3* 10.0* 10.6* 9.7*  HCT 38.8* 32.9*  --  32.1* 30.0* 32.2* 29.5*  MCV 81.9 84.6  --  83.8 83.6 85.4 85.0  PLT 362 267  --  222 216 196 198     Basic Metabolic Panel: Recent Labs  Lab 11/15/20 1719 11/16/20 0437 11/16/20 1019 11/17/20 0332 11/18/20 0405 11/19/20 0507 11/20/20 0250 11/21/20 0511  NA  --  130*  --  127* 125* 126* 130* 129*  K 5.5* 6.6*   < > 5.0 4.4 4.7 4.7 4.3  CL  --  100  --  97* 96* 92* 95* 92*  CO2  --  26  --  _0 GLUCOSE  --  133*  --  237* 206* 248* 116* 265*  BUN  --  36*  --  22* _0 CREATININE 2.33* 1.16  --  0.73 0.59* 0.67 0.51* 0.56*  CALCIUM  --  8.4*  --  8.1* 8.2* 8.1* 8.3* 8.0*  MG 2.4 2.5*  --  2.1 1.8  --  2.4 2.1  PHOS 4.1 3.3  --  2.8 2.7  --   --  3.7   < > = values in this interval not displayed.    GFR: Estimated Creatinine Clearance: 113.6 mL/min (A) (by C-G formula based on SCr of 0.56 mg/dL (L)). Recent Labs  Lab 11/15/20 1935 11/15/20 2248 11/16/20 0437 11/16/20 0441 11/17/20 0332 11/18/20 0405 11/19/20 0507 11/20/20 0250 11/21/20 0511  PROCALCITON  --   --   --   --   --  0.96  --   --   --   WBC  --   --    < >  --    < > 7.8 7.4 8.2 8.0  LATICACIDVEN 3.3* 2.5*  --  1.9  --   --   --   --   --    < > = values in this interval not displayed.     Liver Function Tests: Recent Labs  Lab 11/15/20 0509 11/16/20 0437 11/17/20 0332 11/18/20 0405 11/19/20 0507  AST 28 56* 36 54* 63*  ALT 79* 94* 66* 73* 110*   ALKPHOS 150* 150* 114 115 130*  BILITOT 0.5 0.7 0.5 0.4 0.3  PROT 7.4 6.7 5.6* 5.7* 5.3*  ALBUMIN 2.7* 2.3* 1.9* 1.9* 1.9*    No results for input(s): LIPASE, AMYLASE in the last 168 hours. No results for input(s): AMMONIA in the last 168 hours.  ABG    Component Value Date/Time   PHART 7.36 11/21/2020 0052   PCO2ART 56 (H) 11/21/2020 0052   PO2ART 85 11/21/2020 0052   HCO3 31.6 (H) 11/21/2020 0052   ACIDBASEDEF 2.3 (H) 11/16/2020 0441   O2SAT 96.0 11/21/2020 0052      Coagulation Profile: No results for input(s): INR, PROTIME in the last 168 hours.  Cardiac Enzymes: No results for input(s): CKTOTAL, CKMB, CKMBINDEX, TROPONINI in the last 168 hours.  HbA1C: Hgb A1c MFr Bld  Date/Time Value Ref Range Status  11/12/2020 05:37 AM 5.9 (H) 4.8 - 5.6 % Final    Comment:    (NOTE)         Prediabetes: 5.7 - 6.4         Diabetes: >6.4         Glycemic control for adults with diabetes: <7.0     CBG: Recent Labs  Lab 11/20/20 1916 11/20/20 2330 11/21/20 0325 11/21/20 0729 11/21/20 1127  GLUCAP 170* 208* 208* 196* 150*     Allergies No Known Allergies     Critical Care Time: 40 minutes   Darel Hong, AGACNP-BC [facility] Prefer epic messenger for cross cover needs If after hours, please call E-link

## 2020-11-22 ENCOUNTER — Inpatient Hospital Stay: Payer: Medicaid Other

## 2020-11-22 LAB — CBC WITH DIFFERENTIAL/PLATELET
Abs Immature Granulocytes: 0.09 10*3/uL — ABNORMAL HIGH (ref 0.00–0.07)
Basophils Absolute: 0 10*3/uL (ref 0.0–0.1)
Basophils Relative: 0 %
Eosinophils Absolute: 0.1 10*3/uL (ref 0.0–0.5)
Eosinophils Relative: 1 %
HCT: 30.5 % — ABNORMAL LOW (ref 39.0–52.0)
Hemoglobin: 9.8 g/dL — ABNORMAL LOW (ref 13.0–17.0)
Immature Granulocytes: 1 %
Lymphocytes Relative: 3 %
Lymphs Abs: 0.3 10*3/uL — ABNORMAL LOW (ref 0.7–4.0)
MCH: 27.6 pg (ref 26.0–34.0)
MCHC: 32.1 g/dL (ref 30.0–36.0)
MCV: 85.9 fL (ref 80.0–100.0)
Monocytes Absolute: 0.1 10*3/uL (ref 0.1–1.0)
Monocytes Relative: 1 %
Neutro Abs: 8.6 10*3/uL — ABNORMAL HIGH (ref 1.7–7.7)
Neutrophils Relative %: 94 %
Platelets: 175 10*3/uL (ref 150–400)
RBC: 3.55 MIL/uL — ABNORMAL LOW (ref 4.22–5.81)
RDW: 14.3 % (ref 11.5–15.5)
WBC: 9.2 10*3/uL (ref 4.0–10.5)
nRBC: 0 % (ref 0.0–0.2)

## 2020-11-22 LAB — BASIC METABOLIC PANEL
Anion gap: 5 (ref 5–15)
BUN: 13 mg/dL (ref 6–20)
CO2: 30 mmol/L (ref 22–32)
Calcium: 8.2 mg/dL — ABNORMAL LOW (ref 8.9–10.3)
Chloride: 93 mmol/L — ABNORMAL LOW (ref 98–111)
Creatinine, Ser: 0.54 mg/dL — ABNORMAL LOW (ref 0.61–1.24)
GFR, Estimated: >60 mL/min (ref >60)
Glucose, Bld: 158 mg/dL — ABNORMAL HIGH (ref 70–99)
Potassium: 4.7 mmol/L (ref 3.5–5.1)
Sodium: 128 mmol/L — ABNORMAL LOW (ref 135–145)

## 2020-11-22 LAB — GLUCOSE, CAPILLARY
Glucose-Capillary: 159 mg/dL — ABNORMAL HIGH (ref 70–99)
Glucose-Capillary: 121 mg/dL — ABNORMAL HIGH (ref 70–99)
Glucose-Capillary: 163 mg/dL — ABNORMAL HIGH (ref 70–99)
Glucose-Capillary: 168 mg/dL — ABNORMAL HIGH (ref 70–99)
Glucose-Capillary: 223 mg/dL — ABNORMAL HIGH (ref 70–99)
Glucose-Capillary: 188 mg/dL — ABNORMAL HIGH (ref 70–99)

## 2020-11-22 LAB — MAGNESIUM: Magnesium: 1.9 mg/dL (ref 1.7–2.4)

## 2020-11-22 LAB — PHOSPHORUS: Phosphorus: 3.2 mg/dL (ref 2.5–4.6)

## 2020-11-22 MED ORDER — FUROSEMIDE 10 MG/ML IJ SOLN
20.0000 mg | Freq: Once | INTRAMUSCULAR | Status: AC
  Administered 2020-11-22: 20 mg via INTRAVENOUS
  Filled 2020-11-22: qty 2

## 2020-11-22 MED ORDER — MIDAZOLAM HCL 2 MG/2ML IJ SOLN
INTRAMUSCULAR | Status: AC
  Filled 2020-11-22: qty 4

## 2020-11-22 MED ORDER — MIDAZOLAM HCL 2 MG/2ML IJ SOLN
2.0000 mg | INTRAMUSCULAR | Status: DC | PRN
  Administered 2020-11-22 (×2): 4 mg via INTRAVENOUS
  Filled 2020-11-22 (×2): qty 4

## 2020-11-22 MED ORDER — SULFAMETHOXAZOLE-TRIMETHOPRIM 400-80 MG/5ML IV SOLN
375.0000 mg | Freq: Three times a day (TID) | INTRAVENOUS | Status: DC
  Administered 2020-11-22 – 2020-11-24 (×7): 375 mg via INTRAVENOUS
  Filled 2020-11-22 (×8): qty 23.44

## 2020-11-22 MED ORDER — PROPOFOL 1000 MG/100ML IV EMUL
5.0000 ug/kg/min | INTRAVENOUS | Status: DC
  Administered 2020-11-22: 30 ug/kg/min via INTRAVENOUS
  Administered 2020-11-22: 10 ug/kg/min via INTRAVENOUS
  Administered 2020-11-22: 70 ug/kg/min via INTRAVENOUS
  Administered 2020-11-23 – 2020-11-24 (×10): 80 ug/kg/min via INTRAVENOUS
  Administered 2020-11-24: 70 ug/kg/min via INTRAVENOUS
  Administered 2020-11-24 (×3): 40 ug/kg/min via INTRAVENOUS
  Filled 2020-11-22 (×18): qty 100

## 2020-11-22 NOTE — Progress Notes (Signed)
Patient stacking breaths, dyssynchronous with vent. Still deeply sedated. PRN Vecuronium given as ordered. 30 minutes later, patient HR up to 120s and BP elevated 150s/110s. Versed bolus administered and vitals stabilized. Will continue to monitor.

## 2020-11-22 NOTE — Progress Notes (Signed)
NAME:  Jordan Gibson, MRN:  [id number], DOB:  08/29/1980, LOS: 77 ADMISSION DATE:  11/05/2020  Brief History of Present Illness   Jordan Gibson is a 40 y.o. Male with no significant past medical history who presented to [facility] ED on 11/07/20 with a 1 week history of shortness of breath, nonproductive cough, and fever. He was admitted to Acute Hypoxic Respiratory Failure and Sepsis in the setting of suspected Community Acquired Pneumonia.    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 Not on HAART. CD4 count 9, viral load 1.5 million. 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 Also tested positive for Rhinovirus/enterovirus, Syphilis, and Toxoplasma now complicated by ARDS requiring mechanical ventilation support.   Pertinent  Medical History  No significant past medical history   Significant Hospital Events: Including procedures, antibiotic start and stop dates in addition to other pertinent events  10/22/2020: Presented to ED, to be admitted to stepdown unit by hospitalist 11/07/2020: Increasing FiO2 requirements and worsening respiratory status, PCCM consulted due to high risk for deterioration and need for intubation. ABX broadened to Cefepime and Vancomycin, along with Azithromycin 11/08/20- patient with respiratory failure s/p ETT overnight. Vancomycin dcd today MRSA pcr negative. 11/09/20- Pt extubated to HFNC _0  AND 45%.  Patient with severe ARDS. I met with family and reviewed care plan. +Rhino/ENtero virus 11/10/20- patient improved, has been extubated to HFNC.  Tranferring to step down unit, signed out to [facility]. 11/12/20- FiO2 increased to 75%, dcd IV NS at 50/h, have aded lasix 20 bid, decreased prednisone to 40 once daily, stopped po vicodin.  [employer] ordered BID with RT for recruitment.  Repeat CXR today 11/13/20: Transfferd back to ICU  Due to severe acute hypoxic respiratory failure 11/14/20: Failed HHFN and BiPAP requiring emergent re- intubation. s/p bedside Bronchoscopy 11/15/20: Patient  continues to be hypoxic after increasing FiO2 from 50% to 100% & PEEP from 10 to 12. ABG post these changes revealed a PF ratio of 100 & respiratory acidosis. Proned 6/5 severe hypoxia, ARDS, AIDS, +renal failure 6/7 severe hypoxia, ALI 6/8 severe hypoxia, ARDS initiated PRONING 6/9 SEVERE HYPOXIA  6/10: Severe hypoxia, remains critically ill 6/11: Persistent issues with hypoxemia, increased sedation needs due to ventilator asynchrony  Cultures:  6/7: Blood culture x2>> 6/3: PJP PCR:  Positive 6/3: Toxoplasma PCR: negative, IgG positive 6/3: Histoplasma urinary Ag: <0.5 6/3: BAL:  Normal respiratory flora 6/1: RPR: Reactive, titer 1:4, Ab (+) 6/1: Cryptococcus Ag: (-) 5/31: Hep B/C screen: (-) 5/31: Quantiferon-TB: (-) 5/31: Histoplasma Gal[place]an: <0.5 5/31: Fungitell: >500 5/28:Mycoplasma pneumonia ab >>negative 5/28 Tracheal aspirate: Normal respiratory flora 5/28 Respiratory panel: (+) Rhinovirus / enterovirus 5/26: SARS-CoV-2 PCR>> negative 5/26: Influenza PCR>> negative 5/26: Blood culture x2>>No growth thus far 5/26: Urine Cx>>no growth 5/27: MRSA PCR>> negative 5/27:Strep pneumo urinary antigen>>negative 5/27:Legionella urinary antigen>>negative 6/08: CMV DNA over 1 million copies   Antimicrobials:  5/26 Azithromycin >> 5/31 5/26 Cefepime >> 5/30 5/27 Vancomycin > 5/28 5/31 Bactrim >>  6/3 Penicillin G Benzathine >> 6/4 Anidulafungin >>  6/7 Zosyn>>6/9 6/10 ganciclovir>>   Antibiotics Given (last 72 hours)     Date/Time Action Medication Dose Rate   11/19/20 1206 New Bag/Given   sulfamethoxazole-trimethoprim (BACTRIM) 375 mg of trimethoprim in dextrose 5 % 500 mL IVPB 375 mg of trimethoprim 349 mL/hr   11/19/20 1511 New Bag/Given   piperacillin-tazobactam (ZOSYN) IVPB 3.375 g 3.375 g 12.5 mL/hr   11/19/20 1940 New Bag/Given   sulfamethoxazole-trimethoprim (BACTRIM) 375 mg of trimethoprim in dextrose 5 % 500 mL  IVPB 375 mg of trimethoprim 349 mL/hr   11/19/20  2256 New Bag/Given   piperacillin-tazobactam (ZOSYN) IVPB 3.375 g 3.375 g 12.5 mL/hr   11/20/20 0436 New Bag/Given   sulfamethoxazole-trimethoprim (BACTRIM) 375 mg of trimethoprim in dextrose 5 % 500 mL IVPB 375 mg of trimethoprim 349 mL/hr   11/20/20 0615 New Bag/Given   piperacillin-tazobactam (ZOSYN) IVPB 3.375 g 3.375 g 12.5 mL/hr   11/20/20 1210 New Bag/Given   sulfamethoxazole-trimethoprim (BACTRIM) 375 mg of trimethoprim in dextrose 5 % 500 mL IVPB 375 mg of trimethoprim 349 mL/hr   11/20/20 1412 New Bag/Given   piperacillin-tazobactam (ZOSYN) IVPB 3.375 g 3.375 g 12.5 mL/hr   11/20/20 2114 New Bag/Given  [another abt infusing]   sulfamethoxazole-trimethoprim (BACTRIM) 375 mg of trimethoprim in dextrose 5 % 500 mL IVPB 375 mg of trimethoprim 349 mL/hr   11/21/20 0356 New Bag/Given   sulfamethoxazole-trimethoprim (BACTRIM) 375 mg of trimethoprim in dextrose 5 % 500 mL IVPB 375 mg of trimethoprim 349 mL/hr   11/21/20 1158 New Bag/Given   sulfamethoxazole-trimethoprim (BACTRIM) 375 mg of trimethoprim in dextrose 5 % 500 mL IVPB 375 mg of trimethoprim 349 mL/hr   11/21/20 1350 New Bag/Given   doxycycline (VIBRAMYCIN) 100 mg in sodium chloride 0.9 % 250 mL IVPB 100 mg 125 mL/hr   11/21/20 1517 New Bag/Given   clindamycin (CLEOCIN) IVPB 900 mg 900 mg 100 mL/hr   11/21/20 1519 Given   primaquine tablet 30 mg 30 mg    11/21/20 1530 Given   atovaquone (MEPRON) 750 MG/5ML suspension 1,500 mg 1,500 mg    11/21/20 2113 Given   atovaquone (MEPRON) 750 MG/5ML suspension 1,500 mg 1,500 mg    11/21/20 2124 New Bag/Given   clindamycin (CLEOCIN) IVPB 900 mg 900 mg 100 mL/hr   11/21/20 2335 New Bag/Given   ganciclovir (CYTOVENE) 375 mg in sodium chloride 0.9 % 100 mL IVPB 375 mg 100 mL/hr   11/22/20 0127 New Bag/Given   doxycycline (VIBRAMYCIN) 100 mg in sodium chloride 0.9 % 250 mL IVPB 100 mg 125 mL/hr   11/22/20 0513 New Bag/Given   clindamycin (CLEOCIN) IVPB 900 mg 900 mg 100 mL/hr    11/22/20 1035 New Bag/Given   ganciclovir (CYTOVENE) 375 mg in sodium chloride 0.9 % 100 mL IVPB 375 mg 100 mL/hr      Scheduled Meds:  chlorhexidine gluconate (MEDLINE KIT)  15 mL Mouth Rinse BID   Chlorhexidine Gluconate Cloth  6 each Topical q morning   clonazepam  1 mg Per Tube BID   docusate  100 mg Per Tube BID   enoxaparin (LOVENOX) injection  40 mg Subcutaneous Q24H   feeding supplement (PROSource TF)  45 mL Per Tube BID   free water  20 mL Per Tube Q4H   insulin aspart  0-20 Units Subcutaneous Q4H   insulin aspart  3 Units Subcutaneous Q4H   mouth rinse  15 mL Mouth Rinse 10 times per day   methylPREDNISolone (SOLU-MEDROL) injection  40 mg Intravenous Q12H   oxyCODONE  5 mg Per Tube Q6H   pantoprazole (PROTONIX) IV  40 mg Intravenous Daily   polyethylene glycol  17 g Per Tube Daily   Continuous Infusions:  sodium chloride Stopped (11/23/20 1645)   sodium chloride 10 mL/hr at 11/23/20 1800   sodium chloride 50 mL/hr at 11/23/20 1800   dexmedetomidine (PRECEDEX) IV infusion 1 mcg/kg/hr (11/23/20 1800)   feeding supplement (VITAL 1.5 CAL) 1,000 mL (11/23/20 0049)   ganciclovir Stopped (11/23/20 1343)  HYDROmorphone 2 mg/hr (11/23/20 1800)   norepinephrine (LEVOPHED) Adult infusion Stopped (11/23/20 1433)   propofol (DIPRIVAN) infusion 80 mcg/kg/min (11/23/20 1800)   sulfamethoxazole-trimethoprim 375 mg of trimethoprim (11/23/20 1838)   PRN Meds:.acetaminophen, acetaminophen, midazolam, ondansetron **OR** ondansetron (ZOFRAN) IV, vecuronium  Chest x-ray today:    Interim History / Subjective:  Remains critically ill, + fever Severe hypoxia, requiring 80% FiO2 on vent Sedated with Precedex and Dilaudid, still asynchronous Required addition of propofol  Objective   Blood pressure (!) 154/82, pulse (!) 137, temperature (S) (!) 102.02 F (38.9 C), resp. rate (!) 24, height _0  (1.702 m), weight 72.7 kg, SpO2 90 %.    Vent Mode: PRVC FiO2 (%):  [70 %-100 %] 100  % Set Rate:  [28 bmp] 28 bmp Vt Set:  [500 mL] 500 mL PEEP:  [5 cmH20-10 cmH20] 5 cmH20   Intake/Output Summary (Last 24 hours) at 11/22/2020 1046 Last data filed at 11/22/2020 0901 Gross per 24 hour  Intake 4498.3 ml  Output 3775 ml  Net 723.3 ml    Filed Weights   11/19/20 0500 11/21/20 0357 11/22/20 0424  Weight: 71.8 kg 75 kg 72.7 kg    REVIEW OF SYSTEMS  PATIENT IS UNABLE TO PROVIDE COMPLETE REVIEW OF SYSTEMS DUE TO SEVERE CRITICAL ILLNESS AND TOXIC METABOLIC ENCEPHALOPATHY   PHYSICAL EXAMINATION:  GENERAL: Critically ill-appearing male, laying in bed, intubated and sedated, asynchronous with the vent, tachypneic even on the vent. HEAD: Normocephalic, atraumatic.  EYES: Pupils equal, round, reactive to light.  No scleral icterus.  MOUTH: Moist mucosal membrane.  ET tube in place NECK: Supple, no JVD PULMONARY: Coarse breath sounds bilaterally, no wheezing, overbreathing the vent, even CARDIOVASCULAR: S1 and S2. Regular rate and rhythm. No murmurs, rubs, or gallops.  GASTROINTESTINAL: Soft, nontender, -distended. Positive bowel sounds.  MUSCULOSKELETAL: No swelling, clubbing, or edema.  NEUROLOGIC: Heavily sedated due to ARDS, does not follow commands, pupils PERRLA SKIN: Warm and diaphoretic.  No obvious rashes, lesions, ulceration   Labs/imaging that I havepersonally reviewed  (right click and "Reselect all SmartList Selections" daily)  Labs 11/21/20: Na 129, glucose 265, BUN 13, Cr. 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 leading to severe hypoxic resp failure intubated x 2 with severe b/l ling disease and ARDS/ALI transient renal failrue now ith elevated liver tests with liver dysfunction +PJP PNEUMONIA +TOXO +SYPHILIS +RHINOVIRUS +CMV  Assessment / Plan:   Acute Hypoxic and Hypercapnic Respiratory Failure due to PJP  Pneumonia Severe ARDS -Full vent support, implement lung protective strategies via ARDS Net Protocol -Wean FiO2 and PEEP as tolerated maintain O2 sats greater than 90% -Follow intermittent chest x-ray and ABG as needed -Spontaneous breathing trials when respiratory parameters met -Continue VAP bundle -As needed bronchodilators -Continue antibiotics  Septic Shock -Continuous cardiac monitoring -Maintain MAP greater than 65 -IV fluids -Vasopressors as needed to maintain MAP goal -2D Echo 11/08/20 :LVEF 60-65%, normal RV systolic function -Persistent shock physiology bodes for very poor prognosis  Severe Sepsis due to PJP Pneumonia Newly diagnosed AIDS Persistent Fever -Monitor fever curve -Trend WBCs -Follow cultures -ID following, antibiotics as per ID -Currently on Bactrim after CMV titer back. Bactrim better for PJP/Toxo -CD4 is 7 and VL 1.5 million copies -Will start HAART after 7-10 days of PCP treatment per ID -Added gancyclovir for CMV > 1 million copies  Hyponatremia Volume  overload -Monitor I&O's / urinary output -Follow BMP -Ensure adequate renal perfusion -Avoid nephrotoxic agents as able -Replace electrolytes as indicated -Repeat Lasix x1  Mild Transaminitis -Negative viral hepatitis panel -Trend LFT's  Acute toxic metabolic encephalopathy, need for sedation -Goal RASS -2 to -3 given severe ARDS -Dilaudid & Versed, and Precedex as needed to maintain RASS goal -Avoid sedating meds as able -SAT as tolerates, not stable for same due to high FiO2 requirement -Provide supportive care  Hyperglycemia -CBG's -SSI -Follow ICU Hypo/Hyperglycemia protocol   Pt is critically ill.  Prognosis is guarded. High risk for cardiac arrest and death.  Best practice (right click and "Reselect all SmartList Selections" daily)  Diet:  Tube Feed Pain/Anxiety/Delirium protocol (if indicated): Yes (RASS goal -2 to -3) VAP protocol (if indicated): Yes DVT prophylaxis:  Lovenox GI prophylaxis: PPI Glucose control:  SSI Yes Central venous access:  Yes, and it is still needed Arterial line:  N/A Foley:  Yes, and it is still needed Mobility:  bed rest  PT consulted: N/A Last date of multidisciplinary goals of care discussion [11/21/20] Code Status:  full code Disposition: ICU    Labs   CBC: Recent Labs  Lab 11/18/20 0405 11/19/20 0507 11/20/20 0250 11/21/20 0511 11/22/20 0415  WBC 7.8 7.4 8.2 8.0 9.2  NEUTROABS 7.4 6.9 7.6 7.4 8.6*  HGB 10.3* 10.0* 10.6* 9.7* 9.8*  HCT 32.1* 30.0* 32.2* 29.5* 30.5*  MCV 83.8 83.6 85.4 85.0 85.9  PLT 222 216 196 198 175     Basic Metabolic Panel: Recent Labs  Lab 11/16/20 0437 11/16/20 1019 11/17/20 0332 11/18/20 0405 11/19/20 0507 11/20/20 0250 11/21/20 0511 11/22/20 0415  NA 130*  --  127* 125* 126* 130* 129* 128*  K 6.6*   < > 5.0 4.4 4.7 4.7 4.3 4.7  CL 100  --  97* 96* 92* 95* 92* 93*  CO2 26  --  _0 GLUCOSE 133*  --  237* 206* 248* 116* 265* 158*  BUN 36*  --  22* _1 CREATININE 1.16  --  0.73 0.59* 0.67 0.51* 0.56* 0.54*  CALCIUM 8.4*  --  8.1* 8.2* 8.1* 8.3* 8.0* 8.2*  MG 2.5*  --  2.1 1.8  --  2.4 2.1 1.9  PHOS 3.3  --  2.8 2.7  --   --  3.7 3.2   < > = values in this interval not displayed.    GFR: Estimated Creatinine Clearance: 113.6 mL/min (A) (by C-G formula based on SCr of 0.54 mg/dL (L)). Recent Labs  Lab 11/15/20 1935 11/15/20 2248 11/16/20 0437 11/16/20 0441 11/17/20 0332 11/18/20 0405 11/19/20 0507 11/20/20 0250 11/21/20 0511 11/22/20 0415  PROCALCITON  --   --   --   --   --  0.96  --   --   --   --   WBC  --   --    < >  --    < > 7.8 7.4 8.2 8.0 9.2  LATICACIDVEN 3.3* 2.5*  --  1.9  --   --   --   --   --   --    < > = values in this interval not displayed.     Liver Function Tests: Recent Labs  Lab 11/16/20 0437 11/17/20 0332 11/18/20 0405 11/19/20 0507 11/21/20 0511  AST 56* 36 54* 63* 73*  ALT 94* 66* 73* 110* 139*   ALKPHOS 150* 114 115 130* 149*  BILITOT 0.7 0.5 0.4 0.3 0.4  PROT 6.7 5.6* 5.7* 5.3* 5.1*  ALBUMIN 2.3* 1.9* 1.9* 1.9* 1.7*    No results for input(s): LIPASE, AMYLASE in the last 168 hours. No results for input(s): AMMONIA in the last 168 hours.  ABG    Component Value Date/Time   PHART 7.36 11/21/2020 0052   PCO2ART 56 (H) 11/21/2020 0052   PO2ART 85 11/21/2020 0052   HCO3 31.6 (H) 11/21/2020 0052   ACIDBASEDEF 2.3 (H) 11/16/2020 0441   O2SAT 96.0 11/21/2020 0052      Coagulation Profile: No results for input(s): INR, PROTIME in the last 168 hours.  Cardiac Enzymes: No results for input(s): CKTOTAL, CKMB, CKMBINDEX, TROPONINI in the last 168 hours.  HbA1C: Hgb A1c MFr Bld  Date/Time Value Ref Range Status  11/12/2020 05:37 AM 5.9 (H) 4.8 - 5.6 % Final    Comment:    (NOTE)         Prediabetes: 5.7 - 6.4         Diabetes: >6.4         Glycemic control for adults with diabetes: <7.0     CBG: Recent Labs  Lab 11/21/20 1541 11/21/20 1933 11/21/20 2314 11/22/20 0325 11/22/20 0742  GLUCAP 170* 147* 178* 159* 121*     Allergies No Known Allergies   The patient is critically ill with multiple organ systems failure and requires high complexity decision making for assessment and support, frequent evaluation and titration of therapies, application of advanced monitoring technologies and extensive interpretation of multiple databases. Critical Care Time devoted to patient care services described in this note is 45 minutes.  Updated sister and cousin  at bedside.  Have explained to them the gravity of the situation.  Not sure that they fully grasp the severity.  Renold Don, MD [facility]   *This note was dictated using voice recognition software/Dragon.  Despite best efforts to proofread, errors can occur which can change the meaning.  Any change was purely unintentional.

## 2020-11-22 NOTE — Progress Notes (Signed)
O2 sat in upper 80's, increased FIO2 to 100% and administered Vecuronium to bring O2 sat back up over 90%. O2 sat is now 91% .

## 2020-11-22 NOTE — Progress Notes (Addendum)
@   outset of shift, pt very dysnychronous/anxious/restless and increasingly febrile (38.9), PRN tylenol given. Red rash on face and chest noted. PRN Vec given x3 and multiple boluses of versed given via pump. Per Dr. Jayme Cloud, start propofol gtt and wean off versed gtt. Pt tolerating vent better on propofol, red rash on chest/face resolved.   Fever has resolved, current temp 36.5.   Foley in place, put received 20 mg Lasix near noon. Excellent urine output.  Chest Xray completed.   Pt remains on Prop, Dilaudid, and Precedex. Pt receiving antiviral medications via central line.   Turn q2. Pt in no acute distress @ this time. Will continue to monitor. PRN Versed pushes added @ end of shift for anxiety/agitation.

## 2020-11-23 ENCOUNTER — Encounter: Payer: Self-pay | Admitting: Internal Medicine

## 2020-11-23 ENCOUNTER — Inpatient Hospital Stay: Payer: Medicaid Other

## 2020-11-23 LAB — CBC WITH DIFFERENTIAL/PLATELET
Abs Immature Granulocytes: 0.15 10*3/uL — ABNORMAL HIGH (ref 0.00–0.07)
Basophils Absolute: 0 10*3/uL (ref 0.0–0.1)
Basophils Relative: 0 %
Eosinophils Absolute: 0.2 10*3/uL (ref 0.0–0.5)
Eosinophils Relative: 2 %
HCT: 30.7 % — ABNORMAL LOW (ref 39.0–52.0)
Hemoglobin: 9.7 g/dL — ABNORMAL LOW (ref 13.0–17.0)
Immature Granulocytes: 1 %
Lymphocytes Relative: 3 %
Lymphs Abs: 0.4 10*3/uL — ABNORMAL LOW (ref 0.7–4.0)
MCH: 27.6 pg (ref 26.0–34.0)
MCHC: 31.6 g/dL (ref 30.0–36.0)
MCV: 87.2 fL (ref 80.0–100.0)
Monocytes Absolute: 0.2 10*3/uL (ref 0.1–1.0)
Monocytes Relative: 2 %
Neutro Abs: 10.5 10*3/uL — ABNORMAL HIGH (ref 1.7–7.7)
Neutrophils Relative %: 92 %
Platelets: 183 10*3/uL (ref 150–400)
RBC: 3.52 MIL/uL — ABNORMAL LOW (ref 4.22–5.81)
RDW: 14.7 % (ref 11.5–15.5)
WBC: 11.5 10*3/uL — ABNORMAL HIGH (ref 4.0–10.5)
nRBC: 0 % (ref 0.0–0.2)

## 2020-11-23 LAB — BLOOD GAS, ARTERIAL
Acid-Base Excess: 6.4 mmol/L — ABNORMAL HIGH (ref 0.0–2.0)
Acid-Base Excess: 4.4 mmol/L — ABNORMAL HIGH (ref 0.0–2.0)
Bicarbonate: 34.3 mmol/L — ABNORMAL HIGH (ref 20.0–28.0)
Bicarbonate: 33.1 mmol/L — ABNORMAL HIGH (ref 20.0–28.0)
FIO2: 1
FIO2: 1
MECHVT: 500 mL
MECHVT: 400 mL
Mechanical Rate: 28
Mechanical Rate: 35
O2 Saturation: 91.9 %
O2 Saturation: 93.9 %
PEEP: 5 cmH2O
PEEP: 10 cmH2O
Patient temperature: 37
Patient temperature: 37
pCO2 arterial: 65 mmHg — ABNORMAL HIGH (ref 32.0–48.0)
pCO2 arterial: 72 mmHg (ref 32.0–48.0)
pH, Arterial: 7.33 — ABNORMAL LOW (ref 7.350–7.450)
pH, Arterial: 7.27 — ABNORMAL LOW (ref 7.350–7.450)
pO2, Arterial: 68 mmHg — ABNORMAL LOW (ref 83.0–108.0)
pO2, Arterial: 80 mmHg — ABNORMAL LOW (ref 83.0–108.0)

## 2020-11-23 LAB — GLUCOSE, CAPILLARY
Glucose-Capillary: 130 mg/dL — ABNORMAL HIGH (ref 70–99)
Glucose-Capillary: 133 mg/dL — ABNORMAL HIGH (ref 70–99)
Glucose-Capillary: 146 mg/dL — ABNORMAL HIGH (ref 70–99)
Glucose-Capillary: 146 mg/dL — ABNORMAL HIGH (ref 70–99)
Glucose-Capillary: 199 mg/dL — ABNORMAL HIGH (ref 70–99)
Glucose-Capillary: 220 mg/dL — ABNORMAL HIGH (ref 70–99)
Glucose-Capillary: 260 mg/dL — ABNORMAL HIGH (ref 70–99)
Glucose-Capillary: 72 mg/dL (ref 70–99)

## 2020-11-23 LAB — COMPREHENSIVE METABOLIC PANEL
ALT: 366 U/L — ABNORMAL HIGH (ref 0–44)
AST: 217 U/L — ABNORMAL HIGH (ref 15–41)
Albumin: 1.9 g/dL — ABNORMAL LOW (ref 3.5–5.0)
Alkaline Phosphatase: 309 U/L — ABNORMAL HIGH (ref 38–126)
Anion gap: 5 (ref 5–15)
BUN: 12 mg/dL (ref 6–20)
CO2: 32 mmol/L (ref 22–32)
Calcium: 8.2 mg/dL — ABNORMAL LOW (ref 8.9–10.3)
Chloride: 94 mmol/L — ABNORMAL LOW (ref 98–111)
Creatinine, Ser: 0.56 mg/dL — ABNORMAL LOW (ref 0.61–1.24)
GFR, Estimated: >60 mL/min (ref >60)
Glucose, Bld: 179 mg/dL — ABNORMAL HIGH (ref 70–99)
Potassium: 4.7 mmol/L (ref 3.5–5.1)
Sodium: 131 mmol/L — ABNORMAL LOW (ref 135–145)
Total Bilirubin: 0.5 mg/dL (ref 0.3–1.2)
Total Protein: 5.4 g/dL — ABNORMAL LOW (ref 6.5–8.1)

## 2020-11-23 LAB — CK: Total CK: 19 U/L — ABNORMAL LOW (ref 49–397)

## 2020-11-23 LAB — MAGNESIUM: Magnesium: 2 mg/dL (ref 1.7–2.4)

## 2020-11-23 LAB — PHOSPHORUS: Phosphorus: 4.1 mg/dL (ref 2.5–4.6)

## 2020-11-23 LAB — TRIGLYCERIDES: Triglycerides: 469 mg/dL — ABNORMAL HIGH (ref <150)

## 2020-11-23 MED ORDER — FUROSEMIDE 10 MG/ML IJ SOLN: 20.0000 mg | Freq: Once | INTRAMUSCULAR | Status: DC

## 2020-11-23 MED ORDER — OXYCODONE HCL 5 MG PO TABS
5.0000 mg | ORAL_TABLET | Freq: Four times a day (QID) | ORAL | Status: DC
  Administered 2020-11-23 – 2020-11-24 (×3): 5 mg
  Filled 2020-11-23 (×3): qty 1

## 2020-11-23 MED ORDER — MIDAZOLAM HCL 2 MG/2ML IJ SOLN
2.0000 mg | INTRAMUSCULAR | Status: DC | PRN
  Administered 2020-11-23 (×3): 4 mg via INTRAVENOUS
  Administered 2020-11-23: 2 mg via INTRAVENOUS
  Administered 2020-11-24 (×3): 4 mg via INTRAVENOUS
  Filled 2020-11-23 (×7): qty 4

## 2020-11-23 MED ORDER — NOREPINEPHRINE 16 MG/250ML-% IV SOLN
0.0000 ug/min | INTRAVENOUS | Status: DC
  Administered 2020-11-23: 2 ug/min via INTRAVENOUS
  Filled 2020-11-23: qty 250

## 2020-11-23 MED ORDER — HYDROMORPHONE BOLUS VIA INFUSION
1.0000 mg | INTRAVENOUS | Status: DC | PRN
  Administered 2020-11-24: 1 mg via INTRAVENOUS
  Filled 2020-11-23: qty 1

## 2020-11-23 MED ORDER — MIDAZOLAM HCL 2 MG/2ML IJ SOLN
INTRAMUSCULAR | Status: AC
  Administered 2020-11-23: 2 mg via INTRAVENOUS
  Filled 2020-11-23: qty 4

## 2020-11-23 MED ORDER — HYDROMORPHONE HCL 1 MG/ML IJ SOLN
1.0000 mg | INTRAMUSCULAR | Status: DC | PRN
  Administered 2020-11-23: 1 mg via INTRAVENOUS
  Filled 2020-11-23: qty 1

## 2020-11-23 MED ORDER — FUROSEMIDE 10 MG/ML IJ SOLN
20.0000 mg | Freq: Once | INTRAMUSCULAR | Status: AC
  Administered 2020-11-23: 20 mg via INTRAVENOUS
  Filled 2020-11-23: qty 2

## 2020-11-23 MED ORDER — CLONAZEPAM 0.5 MG PO TBDP
1.0000 mg | ORAL_TABLET | Freq: Two times a day (BID) | ORAL | Status: DC
  Administered 2020-11-23 (×2): 1 mg
  Filled 2020-11-23 (×2): qty 2

## 2020-11-23 MED ORDER — MIDAZOLAM HCL 2 MG/2ML IJ SOLN
INTRAMUSCULAR | Status: AC
  Filled 2020-11-23: qty 4

## 2020-11-23 MED ORDER — MIDAZOLAM HCL 2 MG/2ML IJ SOLN
4.0000 mg | Freq: Once | INTRAMUSCULAR | Status: AC
  Administered 2020-11-23: 4 mg via INTRAVENOUS

## 2020-11-23 MED ORDER — SODIUM CHLORIDE 1 G PO TABS
1.0000 g | ORAL_TABLET | Freq: Every day | ORAL | Status: DC
  Administered 2020-11-23: 1 g
  Filled 2020-11-23 (×2): qty 1

## 2020-11-23 MED ORDER — SODIUM CHLORIDE 0.9 % IV SOLN
1.0000 mg/kg/h | INTRAVENOUS | Status: DC
  Administered 2020-11-23: 0.5 mg/kg/h via INTRAVENOUS
  Administered 2020-11-24 (×3): 1 mg/kg/h via INTRAVENOUS
  Filled 2020-11-23 (×8): qty 5

## 2020-11-23 NOTE — Plan of Care (Signed)
Neuro: responds to stimulation by grimacing, no further movement to physical stimulation, Pupils PERRL Resp: Vec and Versed used for ventilator compliance x 1, PEEP increased to 10 for desaturations to the low 80s CV: TMAX 38.2-tylenol effective, vital signs relatively stable, required Levo for a short time to support BP, Versed given for tachycardia-effective GIGU: foley in place, OG in place-tolerating feeds well, no BM, no emesis Skin: clean dry and intact Social: Sister and Cousin here to visit throughout the day  Problem: Education: Goal: Knowledge of General Education information will improve Description: Including pain rating scale, medication(s)/side effects and non-pharmacologic comfort measures Outcome: Not Progressing   Problem: Health Behavior/Discharge Planning: Goal: Ability to manage health-related needs will improve Outcome: Not Progressing   Problem: Clinical Measurements: Goal: Ability to maintain clinical measurements within normal limits will improve Outcome: Not Progressing Goal: Will remain free from infection Outcome: Not Progressing Goal: Diagnostic test results will improve Outcome: Not Progressing Goal: Respiratory complications will improve Outcome: Not Progressing Goal: Cardiovascular complication will be avoided Outcome: Not Progressing   Problem: Activity: Goal: Risk for activity intolerance will decrease Outcome: Not Progressing   Problem: Nutrition: Goal: Adequate nutrition will be maintained Outcome: Not Progressing   Problem: Coping: Goal: Level of anxiety will decrease Outcome: Not Progressing   Problem: Elimination: Goal: Will not experience complications related to bowel motility Outcome: Not Progressing Goal: Will not experience complications related to urinary retention Outcome: Not Progressing   Problem: Pain Managment: Goal: General experience of comfort will improve Outcome: Not Progressing   Problem: Safety: Goal: Ability  to remain free from injury will improve Outcome: Not Progressing   Problem: Skin Integrity: Goal: Risk for impaired skin integrity will decrease Outcome: Not Progressing   Problem: Activity: Goal: Ability to tolerate increased activity will improve Outcome: Not Progressing   Problem: Clinical Measurements: Goal: Ability to maintain a body temperature in the normal range will improve Outcome: Not Progressing   Problem: Respiratory: Goal: Ability to maintain adequate ventilation will improve Outcome: Not Progressing Goal: Ability to maintain a clear airway will improve Outcome: Not Progressing

## 2020-11-23 NOTE — Progress Notes (Signed)
FIO2 now on 100% due to desats & PEEP reduced to 5 due to high insp pressures (55) which dropped to 28 following change.

## 2020-11-23 NOTE — Progress Notes (Signed)
NAME:  Jordan Gibson, MRN:  [id number], DOB:  [birth year], LOS: 48 ADMISSION DATE:  10/30/2020  Brief History of Present Illness   Jordan Gibson is a 40 y.o. Male with no significant past medical history who presented to [facility] ED on 11/07/20 with a 1 week history of shortness of breath, nonproductive cough, and fever. He was admitted to Acute Hypoxic Respiratory Failure and Sepsis in the setting of suspected Community Acquired Pneumonia.    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 Not on HAART. CD4 count 9, viral load 1.5 million. 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 Also tested positive for Rhinovirus/enterovirus, Syphilis, and Toxoplasma now complicated by ARDS requiring mechanical ventilation support.   Pertinent  Medical History  No significant past medical history   Significant Hospital Events: Including procedures, antibiotic start and stop dates in addition to other pertinent events  10/25/2020: Presented to ED, to be admitted to stepdown unit by hospitalist 11/07/2020: Increasing FiO2 requirements and worsening respiratory status, PCCM consulted due to high risk for deterioration and need for intubation. ABX broadened to Cefepime and Vancomycin, along with Azithromycin 11/08/20- patient with respiratory failure s/p ETT overnight. Vancomycin dcd today MRSA pcr negative. 11/09/20- Pt extubated to HFNC _0  AND 45%.  Patient with severe ARDS. I met with family and reviewed care plan. +Rhino/ENtero virus 11/10/20- patient improved, has been extubated to HFNC.  Tranferring to step down unit, signed out to [facility]. 11/12/20- FiO2 increased to 75%, dcd IV NS at 50/h, have aded lasix 20 bid, decreased prednisone to 40 once daily, stopped po vicodin.  [employer] ordered BID with RT for recruitment.  Repeat CXR today 11/13/20: Transfferd back to ICU  Due to severe acute hypoxic respiratory failure 11/14/20: Failed HHFN and BiPAP requiring emergent re- intubation. s/p bedside Bronchoscopy 11/15/20: Patient  continues to be hypoxic after increasing FiO2 from 50% to 100% & PEEP from 10 to 12. ABG post these changes revealed a PF ratio of 100 & respiratory acidosis. Proned 6/5 severe hypoxia, ARDS, AIDS, +renal failure 6/7 severe hypoxia, ALI 6/8 severe hypoxia, ARDS initiated PRONING 6/9 SEVERE HYPOXIA  6/10: Severe hypoxia, remains critically ill 6/11: Persistent issues with hypoxemia, increased sedation needs due to ventilator asynchrony 6/12: Persistent issues with ventilator asynchrony, increasing FiO2 requirements, updated sister and have made it very clear that his prognosis is exceedingly poor  Cultures:  6/7: Blood culture x2>> 6/3: PJP PCR:  Positive 6/3: Toxoplasma PCR: negative, IgG positive 6/3: Histoplasma urinary Ag: <0.5 6/3: BAL:  Normal respiratory flora 6/1: RPR: Reactive, titer 1:4, Ab (+) 6/1: Cryptococcus Ag: (-) 5/31: Hep B/C screen: (-) 5/31: Quantiferon-TB: (-) 5/31: Histoplasma Gal[place]: <0.5 5/31: Fungitell: >500 5/28:Mycoplasma pneumonia ab >>negative 5/28 Tracheal aspirate: Normal respiratory flora 5/28 Respiratory panel: (+) Rhinovirus / enterovirus 5/26: SARS-CoV-2 PCR>> negative 5/26: Influenza PCR>> negative 5/26: Blood culture x2>>No growth thus far 5/26: Urine Cx>>no growth 5/27: MRSA PCR>> negative 5/27:Strep pneumo urinary antigen>>negative 5/27:Legionella urinary antigen>>negative 6/08: CMV DNA over 1 million copies   Antimicrobials:  5/26 Azithromycin >> 5/31 5/26 Cefepime >> 5/30 5/27 Vancomycin > 5/28 5/31 Bactrim >>  6/3 Penicillin G Benzathine >> 6/4 Anidulafungin >>  6/7 Zosyn>>6/9 6/10 ganciclovir>>   Antibiotics Given (last 72 hours)     Date/Time Action Medication Dose Rate   11/21/20 0356 New Bag/Given   sulfamethoxazole-trimethoprim (BACTRIM) 375 mg of trimethoprim in dextrose 5 % 500 mL IVPB 375 mg of trimethoprim 349 mL/hr   11/21/20 1158 New Bag/Given   sulfamethoxazole-trimethoprim (BACTRIM) 375 mg of trimethoprim  in  dextrose 5 % 500 mL IVPB 375 mg of trimethoprim 349 mL/hr   11/21/20 1350 New Bag/Given   doxycycline (VIBRAMYCIN) 100 mg in sodium chloride 0.9 % 250 mL IVPB 100 mg 125 mL/hr   11/21/20 1517 New Bag/Given   clindamycin (CLEOCIN) IVPB 900 mg 900 mg 100 mL/hr   11/21/20 1519 Given   primaquine tablet 30 mg 30 mg    11/21/20 1530 Given   atovaquone (MEPRON) 750 MG/5ML suspension 1,500 mg 1,500 mg    11/21/20 2113 Given   atovaquone (MEPRON) 750 MG/5ML suspension 1,500 mg 1,500 mg    11/21/20 2124 New Bag/Given   clindamycin (CLEOCIN) IVPB 900 mg 900 mg 100 mL/hr   11/21/20 2335 New Bag/Given   ganciclovir (CYTOVENE) 375 mg in sodium chloride 0.9 % 100 mL IVPB 375 mg 100 mL/hr   11/22/20 0127 New Bag/Given   doxycycline (VIBRAMYCIN) 100 mg in sodium chloride 0.9 % 250 mL IVPB 100 mg 125 mL/hr   11/22/20 0513 New Bag/Given   clindamycin (CLEOCIN) IVPB 900 mg 900 mg 100 mL/hr   11/22/20 1035 New Bag/Given   ganciclovir (CYTOVENE) 375 mg in sodium chloride 0.9 % 100 mL IVPB 375 mg 100 mL/hr   11/22/20 1156 New Bag/Given   sulfamethoxazole-trimethoprim (BACTRIM) 375 mg of trimethoprim in dextrose 5 % 500 mL IVPB 375 mg of trimethoprim 349 mL/hr   11/22/20 1304 New Bag/Given   doxycycline (VIBRAMYCIN) 100 mg in sodium chloride 0.9 % 250 mL IVPB 100 mg 125 mL/hr   11/22/20 1828 New Bag/Given   sulfamethoxazole-trimethoprim (BACTRIM) 375 mg of trimethoprim in dextrose 5 % 500 mL IVPB 375 mg of trimethoprim 349 mL/hr   11/22/20 2257 New Bag/Given   ganciclovir (CYTOVENE) 375 mg in sodium chloride 0.9 % 100 mL IVPB 375 mg 100 mL/hr   11/23/20 0319 New Bag/Given   sulfamethoxazole-trimethoprim (BACTRIM) 375 mg of trimethoprim in dextrose 5 % 500 mL IVPB 375 mg of trimethoprim 349 mL/hr   11/23/20 1116 New Bag/Given   sulfamethoxazole-trimethoprim (BACTRIM) 375 mg of trimethoprim in dextrose 5 % 500 mL IVPB 375 mg of trimethoprim 349 mL/hr   11/23/20 1132 New Bag/Given   ganciclovir (CYTOVENE)  375 mg in sodium chloride 0.9 % 100 mL IVPB 375 mg 100 mL/hr   11/23/20 1838 New Bag/Given   sulfamethoxazole-trimethoprim (BACTRIM) 375 mg of trimethoprim in dextrose 5 % 500 mL IVPB 375 mg of trimethoprim 349 mL/hr      Scheduled Meds:  chlorhexidine gluconate (MEDLINE KIT)  15 mL Mouth Rinse BID   Chlorhexidine Gluconate Cloth  6 each Topical q morning   clonazepam  1 mg Per Tube BID   docusate  100 mg Per Tube BID   enoxaparin (LOVENOX) injection  40 mg Subcutaneous Q24H   feeding supplement (PROSource TF)  45 mL Per Tube BID   free water  20 mL Per Tube Q4H   insulin aspart  0-20 Units Subcutaneous Q4H   insulin aspart  3 Units Subcutaneous Q4H   mouth rinse  15 mL Mouth Rinse 10 times per day   methylPREDNISolone (SOLU-MEDROL) injection  40 mg Intravenous Q12H   midazolam       oxyCODONE  5 mg Per Tube Q6H   pantoprazole (PROTONIX) IV  40 mg Intravenous Daily   polyethylene glycol  17 g Per Tube Daily   sodium chloride  1 g Per Tube Daily   Continuous Infusions:  sodium chloride Stopped (11/23/20 1645)   sodium chloride 10 mL/hr at  11/23/20 1800   sodium chloride 50 mL/hr at 11/23/20 1800   dexmedetomidine (PRECEDEX) IV infusion 1.2 mcg/kg/hr (11/23/20 2103)   feeding supplement (VITAL 1.5 CAL) 1,000 mL (11/23/20 0049)   ganciclovir Stopped (11/23/20 1343)   HYDROmorphone 2 mg/hr (11/23/20 1800)   norepinephrine (LEVOPHED) Adult infusion Stopped (11/23/20 1433)   propofol (DIPRIVAN) infusion 80 mcg/kg/min (11/23/20 2144)   sulfamethoxazole-trimethoprim 375 mg of trimethoprim (11/23/20 1838)   PRN Meds:.acetaminophen, acetaminophen, HYDROmorphone, midazolam, ondansetron **OR** ondansetron (ZOFRAN) IV, vecuronium  Chest x-ray today:    Interim History / Subjective:  Remains critically ill, + fever Severe hypoxia, requiring 80% FiO2 on vent Sedated with Precedex and Dilaudid, still asynchronous Required addition of propofol 6/11  Objective   Blood pressure (!)  110/55, pulse 92, temperature 99.14 F (37.3 C), resp. rate (!) 31, height 5' 7" (1.702 m), weight 72.6 kg, SpO2 92 %.    Vent Mode: PRVC FiO2 (%):  [100 %] 100 % Set Rate:  [28 bmp-35 bmp] 35 bmp Vt Set:  [400 mL-500 mL] 400 mL PEEP:  [5 cmH20-10 cmH20] 10 cmH20 Plateau Pressure:  [27 cmH20] 27 cmH20   Intake/Output Summary (Last 24 hours) at 11/23/2020 2218 Last data filed at 11/23/2020 1800 Gross per 24 hour  Intake 5946.6 ml  Output 2625 ml  Net 3321.6 ml    Filed Weights   11/21/20 0357 11/22/20 0424 11/23/20 0500  Weight: 75 kg 72.7 kg 72.6 kg    REVIEW OF SYSTEMS  PATIENT IS UNABLE TO PROVIDE COMPLETE REVIEW OF SYSTEMS DUE TO SEVERE CRITICAL ILLNESS AND TOXIC METABOLIC ENCEPHALOPATHY   PHYSICAL EXAMINATION:  GENERAL: Critically ill-appearing male, laying in bed, intubated and sedated, asynchronous with the vent, tachypneic even on the vent. HEAD: Normocephalic, atraumatic.  EYES: Pupils equal, round, reactive to light.  No scleral icterus.  MOUTH: Moist mucosal membrane.  ET tube in place NECK: Supple, no JVD PULMONARY: Coarse breath sounds bilaterally, no wheezing, overbreathing the vent, even CARDIOVASCULAR: S1 and S2. Regular rate and rhythm. No murmurs, rubs, or gallops.  GASTROINTESTINAL: Soft, nontender, -distended. Positive bowel sounds.  MUSCULOSKELETAL: No swelling, clubbing, or edema.  NEUROLOGIC: Heavily sedated due to ARDS, does not follow commands, pupils PERRLA SKIN: Warm and diaphoretic.  No obvious rashes, lesions, ulceration   Labs/imaging that I havepersonally reviewed  (right click and "Reselect all SmartList Selections" daily)  Labs 11/21/20: Na 129, glucose 265, BUN 13, Cr. 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 leading to severe hypoxic resp failure intubated x 2 with severe b/l ling disease and  ARDS/ALI transient renal failrue now ith elevated liver tests with liver dysfunction +PJP PNEUMONIA +TOXO +SYPHILIS +RHINOVIRUS +CMV  Assessment / Plan:   Acute Hypoxic and Hypercapnic Respiratory Failure due to PJP Pneumonia Severe ARDS -Full vent support, implement lung protective strategies via ARDS Net Protocol -Reiterated ARDS protocol with VT 400 equals to 6 mL/kg, ABG to follow -Wean FiO2 and PEEP as tolerated maintain O2 sats greater than 90% -Follow intermittent chest x-ray and ABG as needed -Spontaneous breathing trials when respiratory parameters met -Continue VAP bundle -As needed bronchodilators -Continue antibiotics  Septic Shock -Continuous cardiac monitoring -Maintain MAP greater than 65 -IV fluids -Vasopressors as needed to maintain MAP goal -2D Echo 11/08/20 :LVEF 60-65%, normal RV systolic function -Persistent shock physiology bodes for very poor prognosis -Persistent requirement of vasopressors.  Severe  Sepsis due to PJP Pneumonia Newly diagnosed AIDS Persistent Fever -Monitor fever curve -Trend WBCs -Follow cultures -ID following, antibiotics as per ID -Currently on Bactrim after CMV titer back. Bactrim better for PJP/Toxo -CD4 is 7 and VL 1.5 million copies -Will start HAART after 7-10 days of PCP treatment per ID -Added gancyclovir for CMV > 1 million copies  Hyponatremia Volume overload -Monitor I&O's / urinary output -Follow BMP -Ensure adequate renal perfusion -Avoid nephrotoxic agents as able -Replace electrolytes as indicated -Repeat Lasix x1today  Mild Transaminitis -Negative viral hepatitis panel -Trend LFT's  Acute toxic metabolic encephalopathy, need for sedation -Goal RASS -2 to -3 given severe ARDS -Dilaudid & Versed, and Precedex as needed to maintain RASS goal -Avoid sedating meds as able -SAT as tolerates, not stable for same due to high FiO2 requirement -Provide supportive care  Hyperglycemia -CBG's -SSI -Follow ICU  Hypo/Hyperglycemia protocol   Pt is critically ill.  Prognosis is guarded. High risk for cardiac arrest and death.  Best practice (right click and "Reselect all SmartList Selections" daily)  Diet:  Tube Feed Pain/Anxiety/Delirium protocol (if indicated): Yes (RASS goal -2 to -3) VAP protocol (if indicated): Yes DVT prophylaxis: Lovenox GI prophylaxis: PPI Glucose control:  SSI Yes Central venous access:  Yes, and it is still needed Arterial line:  N/A Foley:  Yes, and it is still needed Mobility:  bed rest  PT consulted: N/A Last date of multidisciplinary goals of care discussion [11/23/20] we will place palliative care consultation Code Status:  full code Disposition: ICU    Labs   CBC: Recent Labs  Lab 11/19/20 0507 11/20/20 0250 11/21/20 0511 11/22/20 0415 11/23/20 0528  WBC 7.4 8.2 8.0 9.2 11.5*  NEUTROABS 6.9 7.6 7.4 8.6* 10.5*  HGB 10.0* 10.6* 9.7* 9.8* 9.7*  HCT 30.0* 32.2* 29.5* 30.5* 30.7*  MCV 83.6 85.4 85.0 85.9 87.2  PLT 216 196 198 175 183     Basic Metabolic Panel: Recent Labs  Lab 11/17/20 0332 11/18/20 0405 11/19/20 0507 11/20/20 0250 11/21/20 0511 11/22/20 0415 11/23/20 0528  NA 127* 125* 126* 130* 129* 128* 131*  K 5.0 4.4 4.7 4.7 4.3 4.7 4.7  CL 97* 96* 92* 95* 92* 93* 94*  CO2 _0 32  GLUCOSE 237* 206* 248* 116* 265* 158* 179*  BUN 22* _1 CREATININE 0.73 0.59* 0.67 0.51* 0.56* 0.54* 0.56*  CALCIUM 8.1* 8.2* 8.1* 8.3* 8.0* 8.2* 8.2*  MG 2.1 1.8  --  2.4 2.1 1.9 2.0  PHOS 2.8 2.7  --   --  3.7 3.2 4.1    GFR: Estimated Creatinine Clearance: 113.6 mL/min (A) (by C-G formula based on SCr of 0.56 mg/dL (L)). Recent Labs  Lab 11/18/20 0405 11/19/20 0507 11/20/20 0250 11/21/20 0511 11/22/20 0415 11/23/20 0528  PROCALCITON 0.96  --   --   --   --   --   WBC 7.8   < > 8.2 8.0 9.2 11.5*   < > = values in this interval not displayed.     Liver Function Tests: Recent Labs  Lab 11/17/20 0332  11/18/20 0405 11/19/20 0507 11/21/20 0511 11/23/20 0528  AST 36 54* 63* 73* 217*  ALT 66* 73* 110* 139* 366*  ALKPHOS 114 115 130* 149* 309*  BILITOT 0.5 0.4 0.3 0.4 0.5  PROT 5.6* 5.7* 5.3* 5.1* 5.4*  ALBUMIN 1.9* 1.9* 1.9* 1.7* 1.9*    No results for input(s): LIPASE, AMYLASE in the last  168 hours. No results for input(s): AMMONIA in the last 168 hours.  ABG    Component Value Date/Time   PHART 7.27 (L) 11/23/2020 2109   PCO2ART 72 (HH) 11/23/2020 2109   PO2ART 80 (L) 11/23/2020 2109   HCO3 33.1 (H) 11/23/2020 2109   ACIDBASEDEF 2.3 (H) 11/16/2020 0441   O2SAT 93.9 11/23/2020 2109      Coagulation Profile: No results for input(s): INR, PROTIME in the last 168 hours.  Cardiac Enzymes: Recent Labs  Lab 11/23/20 0528  CKTOTAL 19*    HbA1C: Hgb A1c MFr Bld  Date/Time Value Ref Range Status  11/12/2020 05:37 AM 5.9 (H) 4.8 - 5.6 % Final    Comment:    (NOTE)         Prediabetes: 5.7 - 6.4         Diabetes: >6.4         Glycemic control for adults with diabetes: <7.0     CBG: Recent Labs  Lab 11/23/20 0743 11/23/20 1116 11/23/20 1531 11/23/20 1637 11/23/20 2001  GLUCAP 130* 72 199* 220* 260*     Allergies No Known Allergies   The patient is critically ill with multiple organ systems failure and requires high complexity decision making for assessment and support, frequent evaluation and titration of therapies, application of advanced monitoring technologies and extensive interpretation of multiple databases. Critical Care Time devoted to patient care services described in this note is 45 minutes.  Updated sister at bedside.  Have reiterated to her the gravity of the situation.    Renold Don, MD [facility]   *This note was dictated using voice recognition software/Dragon.  Despite best efforts to proofread, errors can occur which can change the meaning.  Any change was purely unintentional.

## 2020-11-23 NOTE — Progress Notes (Signed)
Required several doses of Vecuronium, and versed to ensure he stayed synchronous with vent.

## 2020-11-24 ENCOUNTER — Encounter: Payer: Self-pay | Admitting: Internal Medicine

## 2020-11-24 ENCOUNTER — Inpatient Hospital Stay: Payer: Medicaid Other

## 2020-11-24 DIAGNOSIS — B250 Cytomegaloviral pneumonitis: Secondary | ICD-10-CM

## 2020-11-24 DIAGNOSIS — B59 Pneumocystosis: Secondary | ICD-10-CM

## 2020-11-24 DIAGNOSIS — J80 Acute respiratory distress syndrome: Secondary | ICD-10-CM

## 2020-11-24 DIAGNOSIS — B20 Human immunodeficiency virus [HIV] disease: Secondary | ICD-10-CM

## 2020-11-24 DIAGNOSIS — A539 Syphilis, unspecified: Secondary | ICD-10-CM

## 2020-11-24 DIAGNOSIS — Z7189 Other specified counseling: Secondary | ICD-10-CM

## 2020-11-24 DIAGNOSIS — J9602 Acute respiratory failure with hypercapnia: Secondary | ICD-10-CM

## 2020-11-24 DIAGNOSIS — B259 Cytomegaloviral disease, unspecified: Secondary | ICD-10-CM

## 2020-11-24 DIAGNOSIS — Z515 Encounter for palliative care: Secondary | ICD-10-CM

## 2020-11-24 LAB — BLOOD GAS, ARTERIAL
Acid-Base Excess: 3.1 mmol/L — ABNORMAL HIGH (ref 0.0–2.0)
Bicarbonate: 34.4 mmol/L — ABNORMAL HIGH (ref 20.0–28.0)
FIO2: 1
MECHVT: 400 mL
Mechanical Rate: 35
O2 Saturation: 93.8 %
PEEP: 10 cmH2O
Patient temperature: 37
pCO2 arterial: 86 mmHg (ref 32.0–48.0)
pH, Arterial: 7.21 — ABNORMAL LOW (ref 7.350–7.450)
pO2, Arterial: 84 mmHg (ref 83.0–108.0)

## 2020-11-24 LAB — COMPREHENSIVE METABOLIC PANEL
ALT: 454 U/L — ABNORMAL HIGH (ref 0–44)
AST: 221 U/L — ABNORMAL HIGH (ref 15–41)
Albumin: 1.8 g/dL — ABNORMAL LOW (ref 3.5–5.0)
Alkaline Phosphatase: 366 U/L — ABNORMAL HIGH (ref 38–126)
Anion gap: 4 — ABNORMAL LOW (ref 5–15)
BUN: 11 mg/dL (ref 6–20)
CO2: 34 mmol/L — ABNORMAL HIGH (ref 22–32)
Calcium: 7.9 mg/dL — ABNORMAL LOW (ref 8.9–10.3)
Chloride: 91 mmol/L — ABNORMAL LOW (ref 98–111)
Creatinine, Ser: 0.58 mg/dL — ABNORMAL LOW (ref 0.61–1.24)
GFR, Estimated: >60 mL/min (ref >60)
Glucose, Bld: 261 mg/dL — ABNORMAL HIGH (ref 70–99)
Potassium: 5.1 mmol/L (ref 3.5–5.1)
Sodium: 129 mmol/L — ABNORMAL LOW (ref 135–145)
Total Bilirubin: 0.6 mg/dL (ref 0.3–1.2)
Total Protein: 5.5 g/dL — ABNORMAL LOW (ref 6.5–8.1)

## 2020-11-24 LAB — CBC WITH DIFFERENTIAL/PLATELET
Abs Immature Granulocytes: 0.05 10*3/uL (ref 0.00–0.07)
Basophils Absolute: 0 10*3/uL (ref 0.0–0.1)
Basophils Relative: 0 %
Eosinophils Absolute: 0.2 10*3/uL (ref 0.0–0.5)
Eosinophils Relative: 2 %
HCT: 30.3 % — ABNORMAL LOW (ref 39.0–52.0)
Hemoglobin: 9.4 g/dL — ABNORMAL LOW (ref 13.0–17.0)
Immature Granulocytes: 1 %
Lymphocytes Relative: 5 %
Lymphs Abs: 0.4 10*3/uL — ABNORMAL LOW (ref 0.7–4.0)
MCH: 27.7 pg (ref 26.0–34.0)
MCHC: 31 g/dL (ref 30.0–36.0)
MCV: 89.4 fL (ref 80.0–100.0)
Monocytes Absolute: 0.2 10*3/uL (ref 0.1–1.0)
Monocytes Relative: 2 %
Neutro Abs: 8.8 10*3/uL — ABNORMAL HIGH (ref 1.7–7.7)
Neutrophils Relative %: 90 %
Platelets: 167 10*3/uL (ref 150–400)
RBC: 3.39 MIL/uL — ABNORMAL LOW (ref 4.22–5.81)
RDW: 14.9 % (ref 11.5–15.5)
WBC: 9.6 10*3/uL (ref 4.0–10.5)
nRBC: 0 % (ref 0.0–0.2)

## 2020-11-24 LAB — MAGNESIUM: Magnesium: 2.1 mg/dL (ref 1.7–2.4)

## 2020-11-24 LAB — GLUCOSE, CAPILLARY
Glucose-Capillary: 138 mg/dL — ABNORMAL HIGH (ref 70–99)
Glucose-Capillary: 139 mg/dL — ABNORMAL HIGH (ref 70–99)
Glucose-Capillary: 142 mg/dL — ABNORMAL HIGH (ref 70–99)
Glucose-Capillary: 143 mg/dL — ABNORMAL HIGH (ref 70–99)
Glucose-Capillary: 162 mg/dL — ABNORMAL HIGH (ref 70–99)
Glucose-Capillary: 219 mg/dL — ABNORMAL HIGH (ref 70–99)
Glucose-Capillary: 251 mg/dL — ABNORMAL HIGH (ref 70–99)

## 2020-11-24 LAB — TRIGLYCERIDES: Triglycerides: 512 mg/dL — ABNORMAL HIGH (ref <150)

## 2020-11-24 LAB — CULTURE, BLOOD (ROUTINE X 2)
Culture: NO GROWTH
Culture: NO GROWTH
Special Requests: ADEQUATE
Special Requests: ADEQUATE

## 2020-11-24 LAB — PHOSPHORUS: Phosphorus: 3.6 mg/dL (ref 2.5–4.6)

## 2020-11-24 MED ORDER — SODIUM ZIRCONIUM CYCLOSILICATE 5 G PO PACK
10.0000 g | PACK | Freq: Every day | ORAL | Status: DC
  Administered 2020-11-24: 10 g
  Filled 2020-11-24: qty 2

## 2020-11-24 MED ORDER — KETAMINE BOLUS VIA INFUSION
0.3500 mg/kg | Freq: Once | INTRAVENOUS | Status: AC
  Administered 2020-11-24: 25.41 mg via INTRAVENOUS
  Filled 2020-11-24: qty 30

## 2020-11-24 MED ORDER — LABETALOL HCL 5 MG/ML IV SOLN
10.0000 mg | INTRAVENOUS | Status: DC | PRN
  Administered 2020-11-24 – 2020-11-25 (×2): 10 mg via INTRAVENOUS
  Filled 2020-11-24: qty 4

## 2020-11-24 MED ORDER — CLINDAMYCIN PHOSPHATE 900 MG/50ML IV SOLN
900.0000 mg | Freq: Three times a day (TID) | INTRAVENOUS | Status: DC
  Administered 2020-11-24 – 2020-12-02 (×25): 900 mg via INTRAVENOUS
  Filled 2020-11-24 (×28): qty 50

## 2020-11-24 MED ORDER — PENICILLIN G BENZATHINE 1200000 UNIT/2ML IM SUSY
2.4000 10*6.[IU] | PREFILLED_SYRINGE | INTRAMUSCULAR | Status: DC
  Administered 2020-11-25: 2.4 10*6.[IU] via INTRAMUSCULAR
  Filled 2020-11-24 (×2): qty 4

## 2020-11-24 MED ORDER — OXYCODONE HCL 5 MG PO TABS
15.0000 mg | ORAL_TABLET | Freq: Four times a day (QID) | ORAL | Status: DC
  Administered 2020-11-24 – 2020-11-26 (×8): 15 mg
  Filled 2020-11-24 (×9): qty 3

## 2020-11-24 MED ORDER — PROSOURCE TF PO LIQD
45.0000 mL | Freq: Three times a day (TID) | ORAL | Status: DC
  Administered 2020-11-24 – 2020-11-25 (×3): 45 mL
  Filled 2020-11-24 (×5): qty 45

## 2020-11-24 MED ORDER — SODIUM CHLORIDE 1 G PO TABS
1.0000 g | ORAL_TABLET | Freq: Two times a day (BID) | ORAL | Status: DC
  Filled 2020-11-24: qty 1

## 2020-11-24 MED ORDER — METOLAZONE 2.5 MG PO TABS
2.5000 mg | ORAL_TABLET | Freq: Once | ORAL | Status: AC
  Administered 2020-11-24: 2.5 mg
  Filled 2020-11-24: qty 1

## 2020-11-24 MED ORDER — PRIMAQUINE PHOSPHATE 26.3 (15 BASE) MG PO TABS
30.0000 mg | ORAL_TABLET | Freq: Every day | ORAL | Status: DC
  Administered 2020-11-24 – 2020-12-01 (×8): 30 mg
  Filled 2020-11-24 (×10): qty 2

## 2020-11-24 MED ORDER — ATOVAQUONE 750 MG/5ML PO SUSP
1500.0000 mg | Freq: Two times a day (BID) | ORAL | Status: DC
  Administered 2020-11-24 – 2020-12-01 (×15): 1500 mg
  Filled 2020-11-24 (×18): qty 10

## 2020-11-24 MED ORDER — MIDAZOLAM 50MG/50ML (1MG/ML) PREMIX INFUSION
0.5000 mg/h | INTRAVENOUS | Status: DC
  Administered 2020-11-24: 4 mg/h via INTRAVENOUS
  Filled 2020-11-24: qty 50

## 2020-11-24 MED ORDER — MIDAZOLAM 50MG/50ML (1MG/ML) PREMIX INFUSION
0.5000 mg/h | INTRAVENOUS | Status: DC
  Administered 2020-11-24: 10 mg/h via INTRAVENOUS
  Administered 2020-11-24: 6 mg/h via INTRAVENOUS
  Administered 2020-11-24: 8 mg/h via INTRAVENOUS
  Administered 2020-11-25 – 2020-11-27 (×11): 10 mg/h via INTRAVENOUS
  Administered 2020-11-27: 7 mg/h via INTRAVENOUS
  Administered 2020-11-27: 8 mg/h via INTRAVENOUS
  Administered 2020-11-28: 10 mg/h via INTRAVENOUS
  Administered 2020-11-28: 8 mg/h via INTRAVENOUS
  Administered 2020-11-28: 4 mg/h via INTRAVENOUS
  Administered 2020-11-29 – 2020-12-02 (×18): 10 mg/h via INTRAVENOUS
  Filled 2020-11-24 (×38): qty 50

## 2020-11-24 MED ORDER — LABETALOL HCL 5 MG/ML IV SOLN
INTRAVENOUS | Status: AC
  Filled 2020-11-24: qty 4

## 2020-11-24 MED ORDER — LORAZEPAM 1 MG PO TABS
1.0000 mg | ORAL_TABLET | ORAL | Status: DC
  Administered 2020-11-24: 1 mg via ORAL

## 2020-11-24 MED ORDER — OXYCODONE HCL 5 MG PO TABS: 15.0000 mg | ORAL_TABLET | Freq: Four times a day (QID) | ORAL | Status: DC

## 2020-11-24 MED ORDER — LORAZEPAM 1 MG PO TABS
1.0000 mg | ORAL_TABLET | ORAL | Status: DC
  Administered 2020-11-24 – 2020-11-26 (×12): 1 mg
  Filled 2020-11-24 (×12): qty 1

## 2020-11-24 MED ORDER — HYDROMORPHONE HCL 1 MG/ML IJ SOLN
1.0000 mg | INTRAMUSCULAR | Status: DC | PRN
  Administered 2020-11-25: 1 mg via INTRAVENOUS
  Filled 2020-11-24: qty 1

## 2020-11-24 MED ORDER — VITAL 1.5 CAL PO LIQD
1000.0000 mL | ORAL | Status: DC
  Administered 2020-11-24 (×2): 1000 mL

## 2020-11-24 MED ORDER — QUETIAPINE FUMARATE 25 MG PO TABS
25.0000 mg | ORAL_TABLET | Freq: Every day | ORAL | Status: DC
  Administered 2020-11-24 – 2020-12-01 (×8): 25 mg
  Filled 2020-11-24 (×8): qty 1

## 2020-11-24 MED ORDER — SODIUM CHLORIDE 1 G PO TABS
2.0000 g | ORAL_TABLET | Freq: Three times a day (TID) | ORAL | Status: DC
  Administered 2020-11-24 – 2020-11-26 (×5): 2 g
  Filled 2020-11-24 (×7): qty 2

## 2020-11-24 MED ORDER — INSULIN GLARGINE 100 UNIT/ML ~~LOC~~ SOLN
7.0000 [IU] | Freq: Every day | SUBCUTANEOUS | Status: DC
  Administered 2020-11-24 – 2020-12-01 (×7): 7 [IU] via SUBCUTANEOUS
  Filled 2020-11-24 (×9): qty 0.07

## 2020-11-24 MED ORDER — SODIUM CHLORIDE 1 G PO TABS
2.0000 g | ORAL_TABLET | Freq: Three times a day (TID) | ORAL | Status: DC
  Administered 2020-11-24: 2 g via ORAL
  Filled 2020-11-24 (×2): qty 2

## 2020-11-24 MED ORDER — ACETAZOLAMIDE 250 MG PO TABS
500.0000 mg | ORAL_TABLET | Freq: Once | ORAL | Status: AC
  Administered 2020-11-24: 500 mg
  Filled 2020-11-24: qty 2

## 2020-11-24 NOTE — Progress Notes (Signed)
Vt increased to 440 after ABG results

## 2020-11-24 NOTE — TOC Progression Note (Signed)
Transition of Care [facility]) - Progression Note    Patient Details  Name: Jordan Gibson MRN: [id number] Date of Birth: May 15, 1981  Transition of Care [facility]) CM/SW Contact  Marina Goodell Phone Number: [phone] 11/24/2020, 4:29 PM  Clinical Narrative:     Patient remains critically ill.  Patient remains intubated, w/ vent at 100%.  Palliative discussed goals of care with patient's sister Jordan Gibson.  Ms. Clarene Reamer stated she would like for patient to remain FULL CODE.       Expected Discharge Plan and Services                                                 Social Determinants of Health (SDOH) Interventions    Readmission Risk Interventions No flowsheet data found.

## 2020-11-24 NOTE — Consult Note (Signed)
ECMO Consult Note   Called for telephone consultation for VV ECMO consideration for patient at [facility] Admitting Diagnosis- PJP pneumonia. Primary Issue- acute hypercapnic and hypoxic respiratory failure - persistent despite lung protective ventilation.  Age:40 y.o. Weight: 72 kg - BMI 25  Days on Mechanical Ventilation- 10 days  MAP FiO2 Oxygen Index P/F Ratio  66 1.0 N/A 66    Vasopressors no   MSOF No   RESP score (VV-ECMO) : -1 (30-50% survival) http://www.respscore.com  SAVE score (VA-ECMO) : http://www.save-score.com  Recent Blood Gas:     Component Value Date/Time   PHART 7.30 (L) 11/24/2020 1140   PCO2ART PENDING 11/24/2020 1140   PO2ART 66 (L) 11/24/2020 1140   HCO3 33.7 (H) 11/24/2020 1140   ACIDBASEDEF 2.3 (H) 11/16/2020 0441   O2SAT 89.0 11/24/2020 1140    Coags:    Component Value Date/Time   INR 1.3 (H) 11/08/2020 0313    CBC    Component Value Date/Time   WBC 9.6 11/24/2020 0435   RBC 3.39 (L) 11/24/2020 0435   HGB 9.4 (L) 11/24/2020 0435   HGB 10.2 (L) 11/17/2020 0956   HCT 30.3 (L) 11/24/2020 0435   HCT 39.8 11/11/2020 0650   PLT 167 11/24/2020 0435   PLT 293 11/11/2020 0650   MCV 89.4 11/24/2020 0435   MCV 86 11/11/2020 0650   MCH 27.7 11/24/2020 0435   MCHC 31.0 11/24/2020 0435   RDW 14.9 11/24/2020 0435   RDW 13.5 11/11/2020 0650   LYMPHSABS 0.4 (L) 11/24/2020 0435   LYMPHSABS 0.6 (L) 11/11/2020 0650   MONOABS 0.2 11/24/2020 0435   EOSABS 0.2 11/24/2020 0435   EOSABS 0.2 11/11/2020 0650   BASOSABS 0.0 11/24/2020 0435   BASOSABS 0.0 11/11/2020 0650    BMET    Component Value Date/Time   NA 129 (L) 11/24/2020 0435   K 5.1 11/24/2020 0435   CL 91 (L) 11/24/2020 0435   CO2 34 (H) 11/24/2020 0435   GLUCOSE 261 (H) 11/24/2020 0435   BUN 11 11/24/2020 0435   CREATININE 0.58 (L) 11/24/2020 0435   CALCIUM 7.9 (L) 11/24/2020 0435   GFRNONAA >60 11/24/2020 0435                                                                                                                                                              ECMO physician Jordan Gibson notified at 1130 (time) Candidate meets ECMO Criteria- No  Placed on ECMO watch at N/A (time).   Referring Physician: Shanon Rosser Mekel Gibson is an 40 y.o. male.  HPI: 40 year old man with newly diagnosed HIV with CD4 9 presenting with PJP pneumonia, who has been intubate for at least 10 days without improvement. Extensive bilateral airspace disease   History reviewed. No pertinent past medical history.  History reviewed. No pertinent surgical history.  History reviewed. No pertinent family history.  Social History:  reports that he has never smoked. He has never used smokeless tobacco. He reports current alcohol use of about 1.0 standard drink of alcohol per week. No history on file for drug use.  Allergies: No Known Allergies  Medications: I have reviewed the patient's current medications.  Results for orders placed or performed during the hospital encounter of 10/21/2020 (from the past 48 hour(s))  Glucose, capillary     Status: Abnormal   Collection Time: 11/22/20  4:01 PM  Result Value Ref Range   Glucose-Capillary 168 (H) 70 - 99 mg/dL    Comment: Glucose reference range applies only to samples taken after fasting for at least 8 hours.  Glucose, capillary     Status: Abnormal   Collection Time: 11/22/20  7:52 PM  Result Value Ref Range   Glucose-Capillary 223 (H) 70 - 99 mg/dL    Comment: Glucose reference range applies only to samples taken after fasting for at least 8 hours.  Glucose, capillary     Status: Abnormal   Collection Time: 11/22/20  8:52 PM  Result Value Ref Range   Glucose-Capillary 188 (H) 70 - 99 mg/dL    Comment: Glucose reference range applies only to samples taken after fasting for at least 8 hours.  Glucose, capillary     Status: Abnormal   Collection Time: 11/23/20 12:12 AM  Result Value Ref Range   Glucose-Capillary 133 (H) 70 - 99  mg/dL    Comment: Glucose reference range applies only to samples taken after fasting for at least 8 hours.  Blood gas, arterial     Status: Abnormal   Collection Time: 11/23/20  2:58 AM  Result Value Ref Range   FIO2 1.00    Delivery systems VENTILATOR    Mode PRESSURE REGULATED VOLUME CONTROL    VT 500 mL   Peep/cpap 5.0 cm H20   pH, Arterial 7.33 (L) 7.350 - 7.450   pCO2 arterial 65 (H) 32.0 - 48.0 mmHg   pO2, Arterial 68 (L) 83.0 - 108.0 mmHg   Bicarbonate 34.3 (H) 20.0 - 28.0 mmol/L   Acid-Base Excess 6.4 (H) 0.0 - 2.0 mmol/L   O2 Saturation 91.9 %   Patient temperature 37.0    Collection site RIGHT RADIAL    Sample type ARTERIAL DRAW    Allens test (pass/fail) PASS PASS   Mechanical Rate 28     Comment: Performed at [facility], [address] [place], [place] [address]  Glucose, capillary     Status: Abnormal   Collection Time: 11/23/20  3:45 AM  Result Value Ref Range   Glucose-Capillary 146 (H) 70 - 99 mg/dL    Comment: Glucose reference range applies only to samples taken after fasting for at least 8 hours.  CBC with Differential/Platelet     Status: Abnormal   Collection Time: 11/23/20  5:28 AM  Result Value Ref Range   WBC 11.5 (H) 4.0 - 10.5 K/uL   RBC 3.52 (L) 4.22 - 5.81 MIL/uL   Hemoglobin 9.7 (L) 13.0 - 17.0 g/dL   HCT 98.1 (L) 19.1 - 47.8 %   MCV 87.2 80.0 - 100.0 fL   MCH 27.6 26.0 - 34.0 pg   MCHC 31.6 30.0 - 36.0 g/dL   RDW 29.5 62.1 - 30.8 %   Platelets 183 150 - 400 K/uL   nRBC 0.0 0.0 - 0.2 %   Neutrophils Relative %  92 %   Neutro Abs 10.5 (H) 1.7 - 7.7 K/uL   Lymphocytes Relative 3 %   Lymphs Abs 0.4 (L) 0.7 - 4.0 K/uL   Monocytes Relative 2 %   Monocytes Absolute 0.2 0.1 - 1.0 K/uL   Eosinophils Relative 2 %   Eosinophils Absolute 0.2 0.0 - 0.5 K/uL   Basophils Relative 0 %   Basophils Absolute 0.0 0.0 - 0.1 K/uL   Immature Granulocytes 1 %   Abs Immature Granulocytes 0.15 (H) 0.00 - 0.07 K/uL    Comment: Performed at [facility], [address] [place], [place] [address]  Phosphorus     Status: None   Collection Time: 11/23/20  5:28 AM  Result Value Ref Range   Phosphorus 4.1 2.5 - 4.6 mg/dL    Comment: Performed at [facility], [address], [place], [place] [address]  Magnesium     Status: None   Collection Time: 11/23/20  5:28 AM  Result Value Ref Range   Magnesium 2.0 1.7 - 2.4 mg/dL    Comment: Performed at [facility], [address] [place], [place] [address]  Triglycerides     Status: Abnormal   Collection Time: 11/23/20  5:28 AM  Result Value Ref Range   Triglycerides 469 (H) <150 mg/dL    Comment: Performed at [facility], [address] [place], [place] [address]  Comprehensive metabolic panel     Status: Abnormal   Collection Time: 11/23/20  5:28 AM  Result Value Ref Range   Sodium 131 (L) 135 - 145 mmol/L   Potassium 4.7 3.5 - 5.1 mmol/L   Chloride 94 (L) 98 - 111 mmol/L   CO2 32 22 - 32 mmol/L   Glucose, Bld 179 (H) 70 - 99 mg/dL    Comment: Glucose reference range applies only to samples taken after fasting for at least 8 hours.   BUN 12 6 - 20 mg/dL   Creatinine, Ser 0.62 (L) 0.61 - 1.24 mg/dL   Calcium 8.2 (L) 8.9 - 10.3 mg/dL   Total Protein 5.4 (L) 6.5 - 8.1 g/dL   Albumin 1.9 (L) 3.5 - 5.0 g/dL   AST 694 (H) 15 - 41 U/L   ALT 366 (H) 0 - 44 U/L   Alkaline Phosphatase 309 (H) 38 - 126 U/L   Total Bilirubin 0.5 0.3 - 1.2 mg/dL   GFR, Estimated >85 >46 mL/min    Comment: (NOTE) Calculated using the CKD-EPI Creatinine Equation (2021)    Anion gap 5 5 - 15    Comment: Performed at [facility], [address] [place], [place] [address]  CK     Status: Abnormal   Collection Time: 11/23/20  5:28 AM  Result Value Ref Range   Total CK 19 (L) 49 - 397 U/L    Comment: Performed at [facility], [address] [place], [place] [address]  Glucose, capillary     Status: Abnormal   Collection Time: 11/23/20  7:43 AM   Result Value Ref Range   Glucose-Capillary 130 (H) 70 - 99 mg/dL    Comment: Glucose reference range applies only to samples taken after fasting for at least 8 hours.  Glucose, capillary     Status: None   Collection Time: 11/23/20 11:16 AM  Result Value Ref Range   Glucose-Capillary 72 70 - 99 mg/dL    Comment: Glucose reference range applies only to samples taken after fasting for at least 8 hours.  Glucose, capillary     Status: Abnormal   Collection Time: 11/23/20  3:31 PM  Result Value Ref Range   Glucose-Capillary 199 (H) 70 - 99 mg/dL    Comment: Glucose reference range applies only to samples taken after fasting for at least 8 hours.  Glucose, capillary     Status: Abnormal   Collection Time: 11/23/20  4:37 PM  Result Value Ref Range   Glucose-Capillary 220 (H) 70 - 99 mg/dL    Comment: Glucose reference range applies only to samples taken after fasting for at least 8 hours.  Glucose, capillary     Status: Abnormal   Collection Time: 11/23/20  8:01 PM  Result Value Ref Range   Glucose-Capillary 260 (H) 70 - 99 mg/dL    Comment: Glucose reference range applies only to samples taken after fasting for at least 8 hours.  Blood gas, arterial     Status: Abnormal   Collection Time: 11/23/20  9:09 PM  Result Value Ref Range   FIO2 1.00    Delivery systems VENTILATOR    Mode PRESSURE REGULATED VOLUME CONTROL    VT 400 mL   Peep/cpap 10.0 cm H20   pH, Arterial 7.27 (L) 7.350 - 7.450   pCO2 arterial 72 (HH) 32.0 - 48.0 mmHg    Comment: CRITICAL RESULT, NOTIFIED PHYSICIAN B RUST CHESTER NP  11/23/20  2129  BC    pO2, Arterial 80 (L) 83.0 - 108.0 mmHg   Bicarbonate 33.1 (H) 20.0 - 28.0 mmol/L   Acid-Base Excess 4.4 (H) 0.0 - 2.0 mmol/L   O2 Saturation 93.9 %   Patient temperature 37.0    Collection site RIGHT RADIAL    Sample type ARTERIAL DRAW    Allens test (pass/fail) PASS PASS   Mechanical Rate 35     Comment: Performed at [facility], [address]  [place], [place] [address]  Glucose, capillary     Status: Abnormal   Collection Time: 11/23/20 11:49 PM  Result Value Ref Range   Glucose-Capillary 146 (H) 70 - 99 mg/dL    Comment: Glucose reference range applies only to samples taken after fasting for at least 8 hours.  Glucose, capillary     Status: Abnormal   Collection Time: 11/24/20  3:33 AM  Result Value Ref Range   Glucose-Capillary 251 (H) 70 - 99 mg/dL    Comment: Glucose reference range applies only to samples taken after fasting for at least 8 hours.  CBC with Differential/Platelet     Status: Abnormal   Collection Time: 11/24/20  4:35 AM  Result Value Ref Range   WBC 9.6 4.0 - 10.5 K/uL   RBC 3.39 (L) 4.22 - 5.81 MIL/uL   Hemoglobin 9.4 (L) 13.0 - 17.0 g/dL   HCT 63.1 (L) 49.7 - 02.6 %   MCV 89.4 80.0 - 100.0 fL   MCH 27.7 26.0 - 34.0 pg   MCHC 31.0 30.0 - 36.0 g/dL   RDW 37.8 58.8 - 50.2 %   Platelets 167 150 - 400 K/uL   nRBC 0.0 0.0 - 0.2 %   Neutrophils Relative % 90 %   Neutro Abs 8.8 (H) 1.7 - 7.7 K/uL   Lymphocytes Relative 5 %   Lymphs Abs 0.4 (L) 0.7 - 4.0 K/uL   Monocytes Relative 2 %   Monocytes Absolute 0.2 0.1 - 1.0 K/uL   Eosinophils Relative 2 %   Eosinophils Absolute 0.2 0.0 - 0.5 K/uL   Basophils Relative 0 %  Basophils Absolute 0.0 0.0 - 0.1 K/uL   Immature Granulocytes 1 %   Abs Immature Granulocytes 0.05 0.00 - 0.07 K/uL    Comment: Performed at [facility], [address] [place], [place] [address]  Phosphorus     Status: None   Collection Time: 11/24/20  4:35 AM  Result Value Ref Range   Phosphorus 3.6 2.5 - 4.6 mg/dL    Comment: Performed at [facility], [address] [place], [place] [address]  Magnesium     Status: None   Collection Time: 11/24/20  4:35 AM  Result Value Ref Range   Magnesium 2.1 1.7 - 2.4 mg/dL    Comment: Performed at [facility], [address] [place], [place] [address]  Comprehensive metabolic panel     Status: Abnormal    Collection Time: 11/24/20  4:35 AM  Result Value Ref Range   Sodium 129 (L) 135 - 145 mmol/L   Potassium 5.1 3.5 - 5.1 mmol/L   Chloride 91 (L) 98 - 111 mmol/L   CO2 34 (H) 22 - 32 mmol/L   Glucose, Bld 261 (H) 70 - 99 mg/dL    Comment: Glucose reference range applies only to samples taken after fasting for at least 8 hours.   BUN 11 6 - 20 mg/dL   Creatinine, Ser 5.780.58 (L) 0.61 - 1.24 mg/dL   Calcium 7.9 (L) 8.9 - 10.3 mg/dL   Total Protein 5.5 (L) 6.5 - 8.1 g/dL   Albumin 1.8 (L) 3.5 - 5.0 g/dL   AST 469221 (H) 15 - 41 U/L   ALT 454 (H) 0 - 44 U/L   Alkaline Phosphatase 366 (H) 38 - 126 U/L   Total Bilirubin 0.6 0.3 - 1.2 mg/dL   GFR, Estimated >62>60 >95>60 mL/min    Comment: (NOTE) Calculated using the CKD-EPI Creatinine Equation (2021)    Anion gap 4 (L) 5 - 15    Comment: Performed at [facility], [address] [place], [place] [address]  Triglycerides     Status: Abnormal   Collection Time: 11/24/20  4:35 AM  Result Value Ref Range   Triglycerides 512 (H) <150 mg/dL    Comment: Performed at [facility], [address] [place], [place] [address]  Blood gas, arterial     Status: Abnormal   Collection Time: 11/24/20  4:40 AM  Result Value Ref Range   FIO2 1.00    Delivery systems VENTILATOR    Mode PRESSURE REGULATED VOLUME CONTROL    VT 400 mL   Peep/cpap 10.0 cm H20   pH, Arterial 7.21 (L) 7.350 - 7.450   pCO2 arterial 86 (HH) 32.0 - 48.0 mmHg    Comment: CRITICAL RESULT, NOTIFIED PHYSICIAN B RUST CHESTER NP  11/24/20  0525  BC    pO2, Arterial 84 83.0 - 108.0 mmHg   Bicarbonate 34.4 (H) 20.0 - 28.0 mmol/L   Acid-Base Excess 3.1 (H) 0.0 - 2.0 mmol/L   O2 Saturation 93.8 %   Patient temperature 37.0    Collection site LEFT RADIAL    Sample type ARTERIAL DRAW    Allens test (pass/fail) PASS PASS   Mechanical Rate 35     Comment: Performed at [facility], [address] [place], [place] [address]  Glucose, capillary     Status: Abnormal    Collection Time: 11/24/20  7:25 AM  Result Value Ref Range   Glucose-Capillary 162 (H) 70 - 99 mg/dL    Comment: Glucose  reference range applies only to samples taken after fasting for at least 8 hours.  Glucose, capillary     Status: Abnormal   Collection Time: 11/24/20 11:18 AM  Result Value Ref Range   Glucose-Capillary 143 (H) 70 - 99 mg/dL    Comment: Glucose reference range applies only to samples taken after fasting for at least 8 hours.  Blood gas, arterial     Status: Abnormal (Preliminary result)   Collection Time: 11/24/20 11:40 AM  Result Value Ref Range   FIO2 1.00    Delivery systems VENTILATOR    Mode PRESSURE REGULATED VOLUME CONTROL    VT 440 mL   LHR 35 resp/min   Peep/cpap 10.0 cm H20   pH, Arterial 7.30 (L) 7.350 - 7.450   pCO2 arterial PENDING 32.0 - 48.0 mmHg   pO2, Arterial 66 (L) 83.0 - 108.0 mmHg   Bicarbonate 33.7 (H) 20.0 - 28.0 mmol/L   Acid-Base Excess 6.0 (H) 0.0 - 2.0 mmol/L   O2 Saturation 89.0 %   Patient temperature 38.0    Collection site A-LINE    Sample type ARTERIAL DRAW    Allens test (pass/fail) NOT APPLICABLE (A) PASS   Mechanical Rate 35     Comment: Performed at [facility], [address] [place], [place] [address]    DG Chest Port 1 View  Result Date: 11/24/2020 CLINICAL DATA:  Acute respiratory failure EXAM: PORTABLE CHEST 1 VIEW COMPARISON:  Radiograph 11/23/2020 FINDINGS: *Endotracheal tube tip terminates approximately 3.1 cm from the carina. *Transesophageal tube tip and side port distal to the GE junction. High attenuation enteric contrast media within the gastric lumen. *Left IJ approach central venous catheter tip terminates at the left brachiocephalic-caval confluence. *External support devices and telemetry leads overlie the chest. Persistent bilateral heterogeneous interstitial and airspace opacities throughout both lungs with minimal if any significant interval clearing from the comparison exam. Lung volumes  remain low. Suspect bilateral pleural effusions. No visible pneumothorax. Stable cardiomediastinal contours though partially obscured by overlying opacity. No acute osseous or chest wall abnormalities. IMPRESSION: Lines and tubes as above. Persistent bilateral heterogeneous airspace disease, not significantly changed from prior Suspect trace effusions. Electronically Signed   By: Kreg Shropshire M.D.   On: 11/24/2020 04:27   DG Chest Port 1 View  Result Date: 11/23/2020 CLINICAL DATA:  Intubation EXAM: PORTABLE CHEST 1 VIEW COMPARISON:  Radiograph 11/22/2020 FINDINGS: Endotracheal tube in the mid to lower trachea, 2.8 cm from the carina. Transesophageal tube tip and side port are distal to the GE junction, terminating below the margins of imaging. Left IJ approach central venous catheter tip terminates in the region of the left brachiocephalic vein-superior caval confluence. Telemetry leads and support devices overlie the chest. No significant interval change in the extensive bilateral mixed patchy interstitial and airspace disease throughout both lungs with a basilar gradient. Partial obscuration of cardiac margins with otherwise stable cardiomediastinal contours. No pneumothorax. No sizable layering pleural effusion. High attenuation enteric contrast media within the gastric lumen. No other acute osseous or soft tissue abnormality. IMPRESSION: 1. Endotracheal tube tip terminates 2.8 cm from the carina, could consider retraction 2 cm to position in the mid trachea. 2. Satisfactory positioning of the transesophageal tube. 3. Left IJ catheter terminates at the brachiocephalic-caval confluence. 4. Stable bilateral heterogeneous opacities compatible with ARDS. Electronically Signed   By: Kreg Shropshire M.D.   On: 11/23/2020 02:50     Blood pressure 123/68, pulse 98, temperature 100.22 F (37.9 C), resp. rate Marland Kitchen)  28, height 5\' 7"  (1.702 m), weight 72.6 kg, SpO2 96 %.   Assessment/Plan:  Patient is not a  candidate for ECMO due to prolonged time on mechanical ventilation and significant underlying illness (advanced AIDS with unclear trajectory).     Date: 11/24/2020 Time: 12:32 PM

## 2020-11-24 NOTE — Progress Notes (Signed)
Inpatient Diabetes Program Recommendations  AACE/ADA: New Consensus Statement on Inpatient Glycemic Control   Target Ranges:  Prepandial:   less than 140 mg/dL      Peak postprandial:   less than 180 mg/dL (1-2 hours)      Critically ill patients:  140 - 180 mg/dL   Results for ADITYA, NASTASI (MRN [id number]) as of 11/24/2020 09:38  Ref. Range 11/23/2020 07:43 11/23/2020 11:16 11/23/2020 15:31 11/23/2020 16:37 11/23/2020 20:01 11/23/2020 23:49 11/24/2020 03:33 11/24/2020 07:25  Glucose-Capillary Latest Ref Range: 70 - 99 mg/dL 885 (H) 72 027 (H) 741 (H) 260 (H) 146 (H) 251 (H) 162 (H)    Review of Glycemic Control  Current orders for Inpatient glycemic control: Novolog 0-20 units Q4H, Novolog 3 units Q4H for tube feeding coverage; Solumedrol 40 mg Q12H, Vital @ 60 ml/hr  Inpatient Diabetes Program Recommendations:    Insulin: If steroids are continued, please consider ordering Lantus 7 units Q24H.  Thanks, Orlando Penner, RN, MSN, CDE Diabetes Coordinator Inpatient Diabetes Program [phone] (Team Pager from 8am to 5pm)

## 2020-11-24 NOTE — Consult Note (Addendum)
Consultation Note Date: 11/24/2020   Patient Name: Jordan Gibson  DOB: 10-18-1980  MRN: [id number]  Age / Sex: 40 y.o., male  PCP: Pcp, No Referring Physician: Ottie Glazier, MD  Reason for Consultation: Establishing goals of care and Psychosocial/spiritual support  HPI/Patient Profile: 40 y.o. male  with past medical history of no significant past medical history, presented to [facility] on 5/27 with a 1 week history of shortness of breath and nonproductive cough/fever admitted on 10/22/2020 with acute hypoxic respiratory failure and sepsis.  Community-acquired pneumonia/positive for HIV/AIDS  Clinical Assessment and Goals of Care: I have reviewed medical records including EPIC notes, labs and imaging, received report from RN, assessed the patient.  [name] is lying quietly in bed.  He appears acutely ill, intubated/ventilated and sedated.  There is no family at bedside at this time.  Conference with bedside nursing staff and critical care attending and NP.    Call to sister/healthcare surrogate, Jordan Gibson to discuss diagnosis prognosis, [place], EOL wishes, disposition and options.  Sister Jordan Gibson tells me that she is here in the hospital at [place] bedside.  I return to the ICU for a face-to-face meeting.  Spanish interpreter, Altha Harm, assist via iPad.  I introduced Palliative Medicine as specialized medical care for people living with serious illness. It focuses on providing relief from the symptoms and stress of a serious illness. The goal is to improve quality of life for both the patient and the family.  Jordan Gibson shares that their parents are in [place], and they know the severity of Jordan Gibson's illness.  She shares that there are 5 siblings, the other siblings are in [place].  She shares that their brothers and sisters know of the severity of Jordan Gibson's illness.  We talked about Jordan Gibson's acute lung  dysfunction.  We talked about the ventilator support including, but not limited to, ventilation, oxygenation, 100% FiO2, no further support is available via ventilator.  Advanced directives, concepts specific to code status, were considered and discussed.  After discussing the "Ventilator failure" we talked about CODE STATUS, if you are doing all these things for [facility], and still and yet his heart stops, the medical team recommends allowing a natural passing.  After thoughtful consideration, Jordan Gibson shares that she would want chest compressions, attempted resuscitation. Discussed the importance of continued conversation with family and the medical providers regarding overall plan of care and treatment options, ensuring decisions are within the context of the patient's values and GOCs.    Questions and concerns were addressed.   The family was encouraged to call with questions or concerns.  PMT will continue to support holistically.  Conference with attending, bedside nursing staff, transition of care team related to patient condition, needs, goals of care.     HCPOA  NEXT OF KIN -sister, Jordan Gibson    SUMMARY OF RECOMMENDATIONS   At this point full scope/full code Time for outcomes PMT to continue to follow   Code Status/Advance Care Planning: Full code  Symptom Management:  Per CCM, no  additional needs at this time.  Palliative Prophylaxis:  Frequent Pain Assessment and Turn Reposition  Additional Recommendations (Limitations, Scope, Preferences): Full Scope Treatment  Psycho-social/Spiritual:  Desire for further Chaplaincy support:no Additional Recommendations: Caregiving  Support/Resources and ICU Family Guide  Prognosis:  Unable to determine, based on outcomes.  Guarded at this point due to severity of illness  Discharge Planning:  To be determined, based on outcomes.  At this point may not survive hospitalization.       Primary Diagnoses: Present on  Admission:  CAP (community acquired pneumonia)  HIV positive ([place])   I have reviewed the medical record, interviewed the patient and family, and examined the patient. The following aspects are pertinent.  History reviewed. No pertinent past medical history. Social History   Socioeconomic History   Marital status: Single    Spouse name: Not on file   Number of children: Not on file   Years of education: Not on file   Highest education level: Not on file  Occupational History   Not on file  Tobacco Use   Smoking status: Never   Smokeless tobacco: Never  Substance and Sexual Activity   Alcohol use: Yes    Alcohol/week: 1.0 standard drink    Types: 1 Cans of beer per week   Drug use: Not on file   Sexual activity: Not on file  Other Topics Concern   Not on file  Social History Narrative   Not on file   Social Determinants of Health   Financial Resource Strain: Not on file  Food Insecurity: Not on file  Transportation Needs: Not on file  Physical Activity: Not on file  Stress: Not on file  Social Connections: Not on file   History reviewed. No pertinent family history. Scheduled Meds:  chlorhexidine gluconate (MEDLINE KIT)  15 mL Mouth Rinse BID   Chlorhexidine Gluconate Cloth  6 each Topical q morning   docusate  100 mg Per Tube BID   enoxaparin (LOVENOX) injection  40 mg Subcutaneous Q24H   feeding supplement (PROSource TF)  45 mL Per Tube TID   free water  20 mL Per Tube Q4H   insulin aspart  0-20 Units Subcutaneous Q4H   insulin aspart  3 Units Subcutaneous Q4H   labetalol       LORazepam  1 mg Per Tube Q4H   mouth rinse  15 mL Mouth Rinse 10 times per day   methylPREDNISolone (SOLU-MEDROL) injection  40 mg Intravenous Q12H   oxyCODONE  15 mg Per Tube Q6H   pantoprazole (PROTONIX) IV  40 mg Intravenous Daily   polyethylene glycol  17 g Per Tube Daily   QUEtiapine  25 mg Per Tube QHS   sodium chloride  2 g Per Tube TID WC   sodium zirconium cyclosilicate  10  g Per Tube Daily   Continuous Infusions:  sodium chloride 250 mL (11/24/20 1117)   dexmedetomidine (PRECEDEX) IV infusion 1.2 mcg/kg/hr (11/24/20 1200)   feeding supplement (VITAL 1.5 CAL) 1,000 mL (11/24/20 1138)   ganciclovir Stopped (11/24/20 0003)   HYDROmorphone 2 mg/hr (11/24/20 1230)   ketamine (KETALAR) Adult IV Infusion 1 mg/kg/hr (11/24/20 1200)   midazolam 8 mg/hr (11/24/20 1229)   norepinephrine (LEVOPHED) Adult infusion Stopped (11/23/20 1433)   propofol (DIPRIVAN) infusion 40 mcg/kg/min (11/24/20 1200)   sulfamethoxazole-trimethoprim 348.7 mL/hr at 11/24/20 1200   PRN Meds:.acetaminophen, acetaminophen, HYDROmorphone (DILAUDID) injection, labetalol, midazolam, ondansetron **OR** ondansetron (ZOFRAN) IV, vecuronium Medications Prior to Admission:  Prior to Admission medications  Medication Sig Start Date End Date Taking? Authorizing Provider  acetaminophen (TYLENOL) 325 MG tablet Take 650 mg by mouth every 6 (six) hours as needed.   Yes [provider]  albuterol (VENTOLIN HFA) 108 (90 Base) MCG/ACT inhaler Inhale 2 puffs into the lungs every 4 (four) hours as needed. 10/31/20  Yes [provider]  fluticasone-salmeterol (ADVAIR) 100-50 MCG/ACT AEPB Inhale 1 puff into the lungs 2 (two) times daily. 11/03/20  Yes [provider]  Vitamin D, Ergocalciferol, (DRISDOL) 1.25 MG (50000 UNIT) CAPS capsule Take 1 capsule by mouth once a week. 10/31/20  Yes [provider]   No Known Allergies Review of Systems  Unable to perform ROS: Intubated   Physical Exam Vitals and nursing note reviewed.  Constitutional:      Appearance: He is ill-appearing.     Interventions: He is intubated.  HENT:     Head: Normocephalic and atraumatic.  Cardiovascular:     Rate and Rhythm: Tachycardia present.  Pulmonary:     Effort: Tachypnea present. He is intubated.  Skin:    General: Skin is warm and dry.    Vital Signs: BP 123/68   Pulse 98   Temp 100.22  F (37.9 C)   Resp (!) 28   Ht '5\' 7"'  (1.702 m)   Wt 72.6 kg   SpO2 96%   BMI 25.07 kg/m  Pain Scale: CPOT   Pain Score: Asleep   SpO2: SpO2: 96 % O2 Device:SpO2: 96 % O2 Flow Rate: .O2 Flow Rate (L/min): 30 L/min  IO: Intake/output summary:  Intake/Output Summary (Last 24 hours) at 11/24/2020 1250 Last data filed at 11/24/2020 1200 Gross per 24 hour  Intake 5224.17 ml  Output 4280 ml  Net 944.17 ml    LBM: Last BM Date: 11/23/20 Baseline Weight: Weight: 77.1 kg Most recent weight: Weight: 72.6 kg     Palliative Assessment/Data:   Flowsheet Rows    Flowsheet Row Most Recent Value  Intake Tab   Referral Department Critical care  Unit at Time of Referral ICU  Palliative Care Primary Diagnosis Pulmonary  Date Notified 11/21/20  Palliative Care Type New Palliative care  Reason for referral Clarify Goals of Care  Date of Admission 10/30/2020  Date first seen by Palliative Care 11/24/20  # of days Palliative referral response time 3 Day(s)  # of days IP prior to Palliative referral 15  Clinical Assessment   Palliative Performance Scale Score 10%  Pain Max last 24 hours Not able to report  Pain Min Last 24 hours Not able to report  Dyspnea Max Last 24 Hours Not able to report  Dyspnea Min Last 24 hours Not able to report  Psychosocial & Spiritual Assessment   Palliative Care Outcomes        Time In: 1300 Time Out: 1410 Time Total: 70 minutes  Greater than 50%  of this time was spent counseling and coordinating care related to the above assessment and plan.  Signed by: Drue Novel, NP   Please contact Palliative Medicine Team phone at [phone] for questions and concerns.  For individual provider: See Shea Evans

## 2020-11-24 NOTE — Progress Notes (Signed)
Date of Admission:  10/17/2020    ID: Jordan Gibson is a 40 y.o. male  Active Problems:   CAP (community acquired pneumonia)   Sepsis ([place])   Acute respiratory failure with hypoxia ([place])   Hyponatremia   Transaminitis   HIV positive ([place])    Subjective:  Remains intubated Still has fever Difficulty in ventilating the patient.     Medications:   chlorhexidine gluconate (MEDLINE KIT)  15 mL Mouth Rinse BID   Chlorhexidine Gluconate Cloth  6 each Topical q morning   clonazepam  1 mg Per Tube BID   docusate  100 mg Per Tube BID   enoxaparin (LOVENOX) injection  40 mg Subcutaneous Q24H   feeding supplement (PROSource TF)  45 mL Per Tube BID   free water  20 mL Per Tube Q4H   insulin aspart  0-20 Units Subcutaneous Q4H   insulin aspart  3 Units Subcutaneous Q4H   mouth rinse  15 mL Mouth Rinse 10 times per day   methylPREDNISolone (SOLU-MEDROL) injection  40 mg Intravenous Q12H   midazolam       oxyCODONE  5 mg Per Tube Q6H   pantoprazole (PROTONIX) IV  40 mg Intravenous Daily   polyethylene glycol  17 g Per Tube Daily   sodium chloride  1 g Per Tube BID    Objective: Vital signs in last 24 hours: Patient Vitals for the past 24 hrs:  BP Temp Temp src Pulse Resp SpO2  11/24/20 0600 114/60 99.32 F (37.4 C) -- 98 (!) 31 92 %  11/24/20 0500 (!) 145/68 99.86 F (37.7 C) -- (!) 109 (!) 28 94 %  11/24/20 0400 125/66 99.86 F (37.7 C) Esophageal 100 (!) 35 95 %  11/24/20 0300 128/60 (!) 100.94 F (38.3 C) -- (!) 108 (!) 35 96 %  11/24/20 0206 -- -- -- -- -- 93 %  11/24/20 0200 (!) 161/77 (!) 101.66 F (38.7 C) -- (!) 123 15 93 %  11/24/20 0100 (!) 151/62 (!) 101.84 F (38.8 C) -- (!) 119 (!) 31 92 %  11/24/20 0000 (!) 164/82 (!) 100.76 F (38.2 C) -- (!) 118 (!) 35 95 %  11/23/20 2300 124/65 99.86 F (37.7 C) -- (!) 102 (!) 35 (!) 89 %  11/23/20 2200 (!) 110/55 99.14 F (37.3 C) -- 92 (!) 31 92 %  11/23/20 2100 (!) 109/54 99.14 F (37.3 C) -- 95 (!) 35 93 %   11/23/20 2000 (!) 115/59 99.5 F (37.5 C) -- (!) 102 14 (!) 84 %  11/23/20 1956 -- -- -- -- -- (!) 85 %  11/23/20 1930 -- 98.96 F (37.2 C) -- 91 (!) 22 90 %  11/23/20 1900 -- 99.14 F (37.3 C) -- 88 (!) 28 96 %  11/23/20 1800 (!) 96/47 99.86 F (37.7 C) -- 91 (!) 28 100 %  11/23/20 1700 (!) 95/54 100.04 F (37.8 C) -- 92 20 97 %  11/23/20 1600 (!) 100/54 100.04 F (37.8 C) Esophageal 95 15 94 %  11/23/20 1500 (!) 110/59 (!) 100.58 F (38.1 C) -- (!) 107 20 (!) 89 %  11/23/20 1400 (!) 161/78 100.04 F (37.8 C) -- (!) 125 (!) 28 90 %  11/23/20 1300 (!) 121/55 100.2 F (37.9 C) -- 89 (!) 28 (!) 86 %  11/23/20 1200 (!) 105/49 (!) 100.4 F (38 C) Esophageal 89 (!) 28 90 %  11/23/20 1100 (!) 84/45 (!) 100.4 F (38 C) -- 92 13 91 %  11/23/20 1000 (!) 92/51 (!) 100.6 F (38.1 C) -- 88 (!) 25 (!) 88 %  11/23/20 0900 (!) 105/58 (!) 100.8 F (38.2 C) -- 91 16 91 %  11/23/20 0800 (!) 110/58 (!) 100.58 F (38.1 C) -- (!) 103 (!) 28 90 %     PHYSICAL EXAM:  General: Intubated sedated Lungs: Bilateral air entry.  Crepitation present Heart: Tachycardia Abdomen: Soft, non-tender,not distended. Bowel sounds normal. No masses Extremities: atraumatic, no cyanosis. No edema. No clubbing Skin: No rashes or lesions. Or bruising Lymph: Cervical, supraclavicular normal. Neurologic: Cannot assess Lab Results Recent Labs    11/23/20 0528 11/24/20 0435  WBC 11.5* 9.6  HGB 9.7* 9.4*  HCT 30.7* 30.3*  NA 131* 129*  K 4.7 5.1  CL 94* 91*  CO2 32 34*  BUN 12 11  CREATININE 0.56* 0.58*   Liver Panel Recent Labs    11/23/20 0528 11/24/20 0435  PROT 5.4* 5.5*  ALBUMIN 1.9* 1.8*  AST 217* 221*  ALT 366* 454*  ALKPHOS 309* 366*  BILITOT 0.5 0.6     Infectious disease work-up BC- NG Fungal antibody-Neg Histoplasma antigen neg Toxo IgG 286 Cryptococcus antigen neg HIV RNA 1.5 million copies PCP PCR  POSITIVE in BAL Beta D glucan > 500 RPR 1:4 TPA positive EBV PCR 4400- Not  significant AFB blood culture sent on 11/19/20 AFB BAL 11/14/20 still pending  CMV > 1 million Studies/Results: DG Chest Port 1 View  Result Date: 11/24/2020 CLINICAL DATA:  Acute respiratory failure EXAM: PORTABLE CHEST 1 VIEW COMPARISON:  Radiograph 11/23/2020 FINDINGS: *Endotracheal tube tip terminates approximately 3.1 cm from the carina. *Transesophageal tube tip and side port distal to the GE junction. High attenuation enteric contrast media within the gastric lumen. *Left IJ approach central venous catheter tip terminates at the left brachiocephalic-caval confluence. *External support devices and telemetry leads overlie the chest. Persistent bilateral heterogeneous interstitial and airspace opacities throughout both lungs with minimal if any significant interval clearing from the comparison exam. Lung volumes remain low. Suspect bilateral pleural effusions. No visible pneumothorax. Stable cardiomediastinal contours though partially obscured by overlying opacity. No acute osseous or chest wall abnormalities. IMPRESSION: Lines and tubes as above. Persistent bilateral heterogeneous airspace disease, not significantly changed from prior Suspect trace effusions. Electronically Signed   By: Lovena Le M.D.   On: 11/24/2020 04:27   DG Chest Port 1 View  Result Date: 11/23/2020 CLINICAL DATA:  Intubation EXAM: PORTABLE CHEST 1 VIEW COMPARISON:  Radiograph 11/22/2020 FINDINGS: Endotracheal tube in the mid to lower trachea, 2.8 cm from the carina. Transesophageal tube tip and side port are distal to the GE junction, terminating below the margins of imaging. Left IJ approach central venous catheter tip terminates in the region of the left brachiocephalic vein-superior caval confluence. Telemetry leads and support devices overlie the chest. No significant interval change in the extensive bilateral mixed patchy interstitial and airspace disease throughout both lungs with a basilar gradient. Partial obscuration of  cardiac margins with otherwise stable cardiomediastinal contours. No pneumothorax. No sizable layering pleural effusion. High attenuation enteric contrast media within the gastric lumen. No other acute osseous or soft tissue abnormality. IMPRESSION: 1. Endotracheal tube tip terminates 2.8 cm from the carina, could consider retraction 2 cm to position in the mid trachea. 2. Satisfactory positioning of the transesophageal tube. 3. Left IJ catheter terminates at the brachiocephalic-caval confluence. 4. Stable bilateral heterogeneous opacities compatible with ARDS. Electronically Signed   By: Lovena Le M.D.   On: 11/23/2020  02:50   DG Chest Port 1 View  Result Date: 11/22/2020 CLINICAL DATA:  Respiratory distress.  ARDS. EXAM: PORTABLE CHEST 1 VIEW COMPARISON:  Yesterday. FINDINGS: Endotracheal tube tip 2 cm above the carina. Left jugular catheter tip in the proximal superior vena cava. Nasogastric tube extending into the stomach. Injected contrast in the stomach. No significant change in extensive bilateral airspace opacity with obscuration of some of the heart borders. No gross cardiac enlargement. No pneumothorax. Unremarkable bones. IMPRESSION: 1. Endotracheal tube tip 2 cm above the carina. This could be retracted 3 cm. 2. Stable changes of ARDS. Electronically Signed   By: Claudie Revering M.D.   On: 11/22/2020 13:43     Assessment/Plan: AIDS: Newly diagnosed.  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 Difficulty in ventilating the patient. Currently on trimethoprim/sulfamethoxazole IV and ganciclovir.  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  Concern for drug allergy.  Bactrim is a drug which can produce cholestatic picture.  So we will stop it and change to clindamycin, primaquine and atovaquone and evaluate the LFTs  Toxoplasma IgG highly positive indicating  reactivation.  Clinically did not have any CNS toxo.  Bactrim can provide primary prophylaxis. So is atovaquone.  RPR 1 is to 4 with positive tPA.  Got a dose of benzathine penicillin.  Will need 2 more doses at weekly interval.  Epstein-Barr virus DNA of 4400 is not significant.  Patient is critically ill with guarded prognosis Discussed the management with the sister at bedside Discussed the management with the intensivist.

## 2020-11-24 NOTE — Progress Notes (Signed)
Nutrition Follow-up  DOCUMENTATION CODES:   Not applicable  INTERVENTION:   Decrease Vital 1.5 to 38m/hr + ProSource 477mTID via tube   Propofol: 17.4559mr- provides 460kcal/day   Free water flushes 32m32m hours to maintain tube patency   Regimen provides 2560kcal/day, 122g/day protein and 1128ml33m free water   NUTRITION DIAGNOSIS:   Inadequate oral intake related to  (pt sedated and ventilated) as evidenced by NPO status.  GOAL:   Provide needs based on ASPEN/SCCM guidelines -met with tube feeds   MONITOR:   Vent status, Labs, Weight trends, TF tolerance, Skin, I & O's  ASSESSMENT:   40 y.37 Male with no significant past medical history who [facility] with a 1 week history of shortness of breath, nonproductive cough and fever. Pt admitted with acute hypoxic respiratory failure and sepsis in the setting of suspected community acquired pneumonia. Pt also positive for HIV   Pt remains sedated and ventilated. Pt tolerating tube feeds well at goal rate; RD will adjust feeds as pt is now on propofol. Per chart, pt is down ~10lbs since admit. Pt is noted to have anasarca. UOP 4125ml.15m+11.0L on his I & O's. Pt getting minimum free water flushes. Pt continues to have hyponatremia. RD will provide pt an additional 300kcal/day in setting of weight loss. Bactrim and lokelma restarted. [facility]   Medications reviewed and include: colace, lovenox, insulin, solu-medrol, oxycodone, protonix, miralax, lokelma, NaCl tabs, precedex, fentanyl, bactrim, propofol   Labs reviewed: Na 129(L), K 5.1 wnl, creat 0.58(L), P 3.6wnl, Mg 2.1 wnl, alb 1.8(L) Triglycerides 512(H) Hgb 9.4(L), Hct 30.3(L) cbgs- 251, 162, 143 x 24 hrs  Patient is currently intubated on ventilator support MV: 15.3 L/min Temp (24hrs), Avg:99.9 F (37.7 C), Min:98.96 F (37.2 C), Max:101.84 F (38.8 C)  Propofol: 17.45ml/h47mrovides 460kcal/day   MAP-  >65mmHg 32mP- 4125ml  Di52mrder:   Diet Order             Diet NPO time specified  Diet effective now                  EDUCATION NEEDS:   No education needs have been identified at this time  Skin:  Skin Assessment: Reviewed RN Assessment  Last BM:  6/12  Height:   Ht Readings from Last 1 Encounters:  11/19/20 _0  (1.702 m)    Weight:   Wt Readings from Last 1 Encounters:  11/23/20 72.6 kg   BMI:  Body mass index is 25.07 kg/m.  Estimated Nutritional Needs:   Kcal:  2200-2500kcal/day  Protein:  110-125g/day  Fluid:  2.1-2.4L/day  Junella Domke CamKoleen DistanceLDN Please refer to [facility]/or RD on-call/weekend/after hours pager

## 2020-11-24 NOTE — Progress Notes (Signed)
NAME:  Jordan Gibson, MRN:  [id number], DOB:  01/26/1981, LOS: 71 ADMISSION DATE:  10/14/2020  Brief History of Present Illness   Jordan Gibson is a 40 y.o. Male with no significant past medical history who presented to [facility] ED on 11/07/20 with a 1 week history of shortness of breath, nonproductive cough, and fever. He was admitted to Acute Hypoxic Respiratory Failure and Sepsis in the setting of suspected Community Acquired Pneumonia.    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 Not on HAART. CD4 count 9, viral load 1.5 million. 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 Also tested positive for Rhinovirus/enterovirus, Syphilis, and Toxoplasma now complicated by ARDS requiring mechanical ventilation support.   Pertinent  Medical History  No significant past medical history   Significant Hospital Events: Including procedures, antibiotic start and stop dates in addition to other pertinent events  11/10/2020: Presented to ED, to be admitted to stepdown unit by hospitalist 11/07/2020: Increasing FiO2 requirements and worsening respiratory status, PCCM consulted due to high risk for deterioration and need for intubation. ABX broadened to Cefepime and Vancomycin, along with Azithromycin 11/08/20- patient with respiratory failure s/p ETT overnight. Vancomycin dcd today MRSA pcr negative. 11/09/20- Pt extubated to HFNC _0  AND 45%.  Patient with severe ARDS. I met with family and reviewed care plan. +Rhino/ENtero virus 11/10/20- patient improved, has been extubated to HFNC.  Tranferring to step down unit, signed out to [facility]. 11/12/20- FiO2 increased to 75%, dcd IV NS at 50/h, have aded lasix 20 bid, decreased prednisone to 40 once daily, stopped po vicodin.  [facility] ordered BID with RT for recruitment.  Repeat CXR today 11/13/20: Transfferd back to ICU  Due to severe acute hypoxic respiratory failure 11/14/20: Failed HHFN and BiPAP requiring emergent re- intubation. s/p bedside Bronchoscopy 11/15/20: Patient  continues to be hypoxic after increasing FiO2 from 50% to 100% & PEEP from 10 to 12. ABG post these changes revealed a PF ratio of 100 & respiratory acidosis. Proned 6/5 severe hypoxia, ARDS, AIDS, +renal failure 6/7 severe hypoxia, ALI 6/8 severe hypoxia, ARDS initiated PRONING 6/9 SEVERE HYPOXIA  6/10: Severe hypoxia, remains critically ill 6/11: Persistent issues with hypoxemia, increased sedation needs due to ventilator asynchrony 6/12: Persistent issues with ventilator asynchrony, increasing FiO2 requirements, updated sister and have made it very clear that his prognosis is exceedingly poor 11/24/20-  patient on mechanical ventilation on FiO2 100%, there was previosly recurrent dyssynchrony requiring numerous doses of paralytics to reach stable vital signs.  Complex MICU course on >8 infusions simultaneously. Reviewed in detail with RN this morning.  Very unfortunate and extremely ill young patient at high risk of death.  We considered transfer for potential ECMO - [facility] is at capacity unable to accept any patients, [facility] is at capacity unable to take any patients, [facility] was able to review case but patient not candidate.  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  Reivewed with Palliative care today.  Cultures:  6/7: Blood culture x2>> 6/3: PJP PCR:  Positive 6/3: Toxoplasma PCR: negative, IgG positive 6/3: Histoplasma urinary Ag: <0.5 6/3: BAL:  Normal respiratory flora 6/1: RPR: Reactive, titer 1:4, Ab (+) 6/1: Cryptococcus Ag: (-) 5/31: Hep B/C screen: (-) 5/31: Quantiferon-TB: (-) 5/31: Histoplasma Gal[place]: <0.5 5/31: Fungitell: >500 5/28:Mycoplasma pneumonia ab >>negative 5/28 Tracheal aspirate: Normal respiratory flora 5/28 Respiratory panel: (+) Rhinovirus / enterovirus 5/26: SARS-CoV-2 PCR>> negative 5/26: Influenza PCR>> negative 5/26: Blood culture  x2>>No growth thus far 5/26: Urine Cx>>no  growth 5/27: MRSA PCR>> negative 5/27:Strep pneumo urinary antigen>>negative 5/27:Legionella urinary antigen>>negative 6/08: CMV DNA over 1 million copies   Antimicrobials:  5/26 Azithromycin >> 5/31 5/26 Cefepime >> 5/30 5/27 Vancomycin > 5/28 5/31 Bactrim >>  6/3 Penicillin G Benzathine >> 6/4 Anidulafungin >>  6/7 Zosyn>>6/9 6/10 ganciclovir>>   Antibiotics Given (last 72 hours)     Date/Time Action Medication Dose Rate   11/21/20 1158 New Bag/Given   sulfamethoxazole-trimethoprim (BACTRIM) 375 mg of trimethoprim in dextrose 5 % 500 mL IVPB 375 mg of trimethoprim 349 mL/hr   11/21/20 1350 New Bag/Given   doxycycline (VIBRAMYCIN) 100 mg in sodium chloride 0.9 % 250 mL IVPB 100 mg 125 mL/hr   11/21/20 1517 New Bag/Given   clindamycin (CLEOCIN) IVPB 900 mg 900 mg 100 mL/hr   11/21/20 1519 Given   primaquine tablet 30 mg 30 mg    11/21/20 1530 Given   atovaquone (MEPRON) 750 MG/5ML suspension 1,500 mg 1,500 mg    11/21/20 2113 Given   atovaquone (MEPRON) 750 MG/5ML suspension 1,500 mg 1,500 mg    11/21/20 2124 New Bag/Given   clindamycin (CLEOCIN) IVPB 900 mg 900 mg 100 mL/hr   11/21/20 2335 New Bag/Given   ganciclovir (CYTOVENE) 375 mg in sodium chloride 0.9 % 100 mL IVPB 375 mg 100 mL/hr   11/22/20 0127 New Bag/Given   doxycycline (VIBRAMYCIN) 100 mg in sodium chloride 0.9 % 250 mL IVPB 100 mg 125 mL/hr   11/22/20 0513 New Bag/Given   clindamycin (CLEOCIN) IVPB 900 mg 900 mg 100 mL/hr   11/22/20 1035 New Bag/Given   ganciclovir (CYTOVENE) 375 mg in sodium chloride 0.9 % 100 mL IVPB 375 mg 100 mL/hr   11/22/20 1156 New Bag/Given   sulfamethoxazole-trimethoprim (BACTRIM) 375 mg of trimethoprim in dextrose 5 % 500 mL IVPB 375 mg of trimethoprim 349 mL/hr   11/22/20 1304 New Bag/Given   doxycycline (VIBRAMYCIN) 100 mg in sodium chloride 0.9 % 250 mL IVPB 100 mg 125 mL/hr   11/22/20 1828 New Bag/Given   sulfamethoxazole-trimethoprim (BACTRIM) 375 mg of trimethoprim in  dextrose 5 % 500 mL IVPB 375 mg of trimethoprim 349 mL/hr   11/22/20 2257 New Bag/Given   ganciclovir (CYTOVENE) 375 mg in sodium chloride 0.9 % 100 mL IVPB 375 mg 100 mL/hr   11/23/20 0319 New Bag/Given   sulfamethoxazole-trimethoprim (BACTRIM) 375 mg of trimethoprim in dextrose 5 % 500 mL IVPB 375 mg of trimethoprim 349 mL/hr   11/23/20 1116 New Bag/Given   sulfamethoxazole-trimethoprim (BACTRIM) 375 mg of trimethoprim in dextrose 5 % 500 mL IVPB 375 mg of trimethoprim 349 mL/hr   11/23/20 1132 New Bag/Given   ganciclovir (CYTOVENE) 375 mg in sodium chloride 0.9 % 100 mL IVPB 375 mg 100 mL/hr   11/23/20 1838 New Bag/Given   sulfamethoxazole-trimethoprim (BACTRIM) 375 mg of trimethoprim in dextrose 5 % 500 mL IVPB 375 mg of trimethoprim 349 mL/hr   11/23/20 2303 New Bag/Given   ganciclovir (CYTOVENE) 375 mg in sodium chloride 0.9 % 100 mL IVPB 375 mg 100 mL/hr   11/24/20 0241 New Bag/Given   sulfamethoxazole-trimethoprim (BACTRIM) 375 mg of trimethoprim in dextrose 5 % 500 mL IVPB 375 mg of trimethoprim 349 mL/hr      Scheduled Meds:  chlorhexidine gluconate (MEDLINE KIT)  15 mL Mouth Rinse BID   Chlorhexidine Gluconate Cloth  6 each Topical q morning   clonazepam  1 mg Per Tube BID   docusate  100  mg Per Tube BID   enoxaparin (LOVENOX) injection  40 mg Subcutaneous Q24H   feeding supplement (PROSource TF)  45 mL Per Tube BID   free water  20 mL Per Tube Q4H   insulin aspart  0-20 Units Subcutaneous Q4H   insulin aspart  3 Units Subcutaneous Q4H   mouth rinse  15 mL Mouth Rinse 10 times per day   methylPREDNISolone (SOLU-MEDROL) injection  40 mg Intravenous Q12H   oxyCODONE  5 mg Per Tube Q6H   pantoprazole (PROTONIX) IV  40 mg Intravenous Daily   polyethylene glycol  17 g Per Tube Daily   sodium chloride  1 g Per Tube BID   Continuous Infusions:  sodium chloride Stopped (11/23/20 1645)   sodium chloride 10 mL/hr at 11/24/20 0600   sodium chloride 50 mL/hr at 11/23/20 1800    dexmedetomidine (PRECEDEX) IV infusion 1 mcg/kg/hr (11/24/20 0600)   feeding supplement (VITAL 1.5 CAL) 60 mL/hr at 11/24/20 0600   ganciclovir 375 mg (11/23/20 2303)   HYDROmorphone 2 mg/hr (11/24/20 0600)   ketamine (KETALAR) Adult IV Infusion 1 mg/kg/hr (11/24/20 0720)   midazolam 6 mg/hr (11/24/20 0615)   norepinephrine (LEVOPHED) Adult infusion Stopped (11/23/20 1433)   propofol (DIPRIVAN) infusion 40 mcg/kg/min (11/24/20 0819)   sulfamethoxazole-trimethoprim 375 mg of trimethoprim (11/24/20 0241)   PRN Meds:.acetaminophen, acetaminophen, HYDROmorphone, midazolam, ondansetron **OR** ondansetron (ZOFRAN) IV, vecuronium  Chest x-ray today:    Interim History / Subjective:  Remains critically ill, + fever Severe hypoxia, requiring 80% FiO2 on vent Sedated with Precedex and Dilaudid, still asynchronous Required addition of propofol 6/11  Objective   Blood pressure (!) 107/57, pulse 89, temperature 99.14 F (37.3 C), resp. rate (!) 26, height 5' 7" (1.702 m), weight 72.6 kg, SpO2 97 %.    Vent Mode: PRVC FiO2 (%):  [100 %] 100 % Set Rate:  [28 bmp-35 bmp] 35 bmp Vt Set:  [400 mL-500 mL] 440 mL PEEP:  [10 cmH20] 10 cmH20 Plateau Pressure:  [32 cmH20] 32 cmH20   Intake/Output Summary (Last 24 hours) at 11/24/2020 0834 Last data filed at 11/24/2020 0741 Gross per 24 hour  Intake 4400.92 ml  Output 4155 ml  Net 245.92 ml    Filed Weights   11/21/20 0357 11/22/20 0424 11/23/20 0500  Weight: 75 kg 72.7 kg 72.6 kg    REVIEW OF SYSTEMS  PATIENT IS UNABLE TO PROVIDE COMPLETE REVIEW OF SYSTEMS DUE TO SEVERE CRITICAL ILLNESS AND TOXIC METABOLIC ENCEPHALOPATHY   PHYSICAL EXAMINATION:  GENERAL: Critically ill-appearing male, laying in bed, intubated and sedated, asynchronous with the vent, tachypneic even on the vent. HEAD: Normocephalic, atraumatic.  EYES: Pupils equal, round, reactive to light.  No scleral icterus.  MOUTH: Moist mucosal membrane.  ET tube in place NECK:  Supple, + JVD 4+ PULMONARY: Coarse breath sounds bilaterally, no wheezing, overbreathing the vent, even CARDIOVASCULAR: S1 and S2. Regular rate and rhythm. No murmurs, rubs, or gallops.  GASTROINTESTINAL: Soft, nontender, -distended. Positive bowel sounds.  MUSCULOSKELETAL: No swelling, clubbing, or edema.  NEUROLOGIC: Heavily sedated due to ARDS, does not follow commands, pupils PERRLA SKIN: Warm and diaphoretic.  No obvious rashes, lesions, ulceration   Labs/imaging that I havepersonally reviewed  (right click and "Reselect all SmartList Selections" daily)  Labs 11/21/20: Na 129, glucose 265, BUN 13, Cr. 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  leading to severe hypoxic resp failure intubated x 2 with severe b/l ling disease and ARDS/ALI transient renal failrue now ith elevated liver tests with liver dysfunction +PJP PNEUMONIA +TOXO +SYPHILIS +RHINOVIRUS +CMV  Assessment / Plan:   Acute Hypoxic and Hypercapnic Respiratory Failure due to PJP Pneumonia Severe ARDS -Full vent support, implement lung protective strategies via ARDS Net Protocol -Reiterated ARDS protocol with VT 400 equals to 6 mL/kg, ABG to follow -Wean FiO2 and PEEP as tolerated maintain O2 sats greater than 90% -Follow intermittent chest x-ray and ABG as needed -Spontaneous breathing trials when respiratory parameters met -Continue VAP bundle -As needed bronchodilators -Continue antibiotics  Septic Shock -Continuous cardiac monitoring -Maintain MAP greater than 65 -IV fluids -Vasopressors as needed to maintain MAP goal -2D Echo 11/08/20 :LVEF 60-65%, normal RV systolic function -Persistent shock physiology bodes for very poor prognosis -Persistent requirement of vasopressors.  Severe Sepsis due to PJP Pneumonia Newly diagnosed AIDS Persistent Fever -Monitor fever  curve -Trend WBCs -Follow cultures -ID following, antibiotics as per ID -Currently on Bactrim after CMV titer back. Bactrim better for PJP/Toxo -CD4 is 7 and VL 1.5 million copies -Will start HAART after 7-10 days of PCP treatment per ID -gancyclovir for CMV > 1 million copies  Hyponatremia Volume overload -Monitor I&O's / urinary output -Follow BMP -Ensure adequate renal perfusion -Avoid nephrotoxic agents as able -Replace electrolytes as indicated -Repeat Lasix x1today  Mild Transaminitis -Negative viral hepatitis panel -Trend LFT's  Acute toxic metabolic encephalopathy, need for sedation -Goal RASS -2 to -3 given severe ARDS -Dilaudid & Versed, and Precedex as needed to maintain RASS goal -Avoid sedating meds as able -SAT as tolerates, not stable for same due to high FiO2 requirement -Provide supportive care  Hyperglycemia -CBG's -SSI -Follow ICU Hypo/Hyperglycemia protocol   Pt is critically ill.  Prognosis is guarded. High risk for cardiac arrest and death.  Best practice (right click and "Reselect all SmartList Selections" daily)  Diet:  Tube Feed Pain/Anxiety/Delirium protocol (if indicated): Yes (RASS goal -2 to -3) VAP protocol (if indicated): Yes DVT prophylaxis: Lovenox GI prophylaxis: PPI Glucose control:  SSI Yes Central venous access:  Yes, and it is still needed Arterial line:  N/A Foley:  Yes, and it is still needed Mobility:  bed rest  PT consulted: N/A Last date of multidisciplinary goals of care discussion [11/23/20] we will place palliative care consultation Code Status:  full code Disposition: ICU    Labs   CBC: Recent Labs  Lab 11/20/20 0250 11/21/20 0511 11/22/20 0415 11/23/20 0528 11/24/20 0435  WBC 8.2 8.0 9.2 11.5* 9.6  NEUTROABS 7.6 7.4 8.6* 10.5* 8.8*  HGB 10.6* 9.7* 9.8* 9.7* 9.4*  HCT 32.2* 29.5* 30.5* 30.7* 30.3*  MCV 85.4 85.0 85.9 87.2 89.4  PLT 196 198 175 183 167     Basic Metabolic Panel: Recent Labs  Lab  11/18/20 0405 11/19/20 0507 11/20/20 0250 11/21/20 0511 11/22/20 0415 11/23/20 0528 11/24/20 0435  NA 125*   < > 130* 129* 128* 131* 129*  K 4.4   < > 4.7 4.3 4.7 4.7 5.1  CL 96*   < > 95* 92* 93* 94* 91*  CO2 26   < > _0 32 34*  GLUCOSE 206*   < > 116* 265* 158* 179* 261*  BUN 16   < > _1 CREATININE 0.59*   < > 0.51* 0.56* 0.54* 0.56* 0.58*  CALCIUM 8.2*   < > 8.3* 8.0* 8.2* 8.2*  7.9*  MG 1.8  --  2.4 2.1 1.9 2.0 2.1  PHOS 2.7  --   --  3.7 3.2 4.1 3.6   < > = values in this interval not displayed.    GFR: Estimated Creatinine Clearance: 113.6 mL/min (A) (by C-G formula based on SCr of 0.58 mg/dL (L)). Recent Labs  Lab 11/18/20 0405 11/19/20 0507 11/21/20 0511 11/22/20 0415 11/23/20 0528 11/24/20 0435  PROCALCITON 0.96  --   --   --   --   --   WBC 7.8   < > 8.0 9.2 11.5* 9.6   < > = values in this interval not displayed.     Liver Function Tests: Recent Labs  Lab 11/18/20 0405 11/19/20 0507 11/21/20 0511 11/23/20 0528 11/24/20 0435  AST 54* 63* 73* 217* 221*  ALT 73* 110* 139* 366* 454*  ALKPHOS 115 130* 149* 309* 366*  BILITOT 0.4 0.3 0.4 0.5 0.6  PROT 5.7* 5.3* 5.1* 5.4* 5.5*  ALBUMIN 1.9* 1.9* 1.7* 1.9* 1.8*    No results for input(s): LIPASE, AMYLASE in the last 168 hours. No results for input(s): AMMONIA in the last 168 hours.  ABG    Component Value Date/Time   PHART 7.21 (L) 11/24/2020 0440   PCO2ART 86 (HH) 11/24/2020 0440   PO2ART 84 11/24/2020 0440   HCO3 34.4 (H) 11/24/2020 0440   ACIDBASEDEF 2.3 (H) 11/16/2020 0441   O2SAT 93.8 11/24/2020 0440      Coagulation Profile: No results for input(s): INR, PROTIME in the last 168 hours.  Cardiac Enzymes: Recent Labs  Lab 11/23/20 0528  CKTOTAL 19*     HbA1C: Hgb A1c MFr Bld  Date/Time Value Ref Range Status  11/12/2020 05:37 AM 5.9 (H) 4.8 - 5.6 % Final    Comment:    (NOTE)         Prediabetes: 5.7 - 6.4         Diabetes: >6.4         Glycemic control for  adults with diabetes: <7.0     CBG: Recent Labs  Lab 11/23/20 1637 11/23/20 2001 11/23/20 2349 11/24/20 0333 11/24/20 0725  GLUCAP 220* 260* 146* 251* 162*     Allergies No Known Allergies   Critical care provider statement:    Critical care time (minutes):  109   Critical care time was exclusive of:  Separately billable procedures and  treating other patients   Critical care was necessary to treat or prevent imminent or  life-threatening deterioration of the following conditions:  AIDS, ARDS, JPJ pna, CMV pneumnia, severe ARDS   Critical care was time spent personally by me on the following  activities:  Development of treatment plan with patient or surrogate,  discussions with consultants, evaluation of patient's response to  treatment, examination of patient, obtaining history from patient or  surrogate, ordering and performing treatments and interventions, ordering  and review of laboratory studies and re-evaluation of patient's condition   I assumed direction of critical care for this patient from another  provider in my specialty: no     *This note was dictated using voice recognition software/Dragon.  Despite best efforts to proofread, errors can occur which can change the meaning.  Any change was purely unintentional.    Ottie Glazier, M.D.  [facility] [place] ]

## 2020-11-24 NOTE — Procedures (Signed)
Arterial Catheter Insertion Procedure Note  Jordan Gibson  [id number]  August 14, 1980  Date:11/24/20  Time:11:38 AM    Provider Performing: Judithe Modest    Procedure: Insertion of Arterial Line (43154) without US guidance  Indication(s) Blood pressure monitoring and/or need for frequent ABGs  Consent Unable to obtain consent due to emergent nature of procedure.  Anesthesia None   Time Out Verified patient identification, verified procedure, site/side was marked, verified correct patient position, special equipment/implants available, medications/allergies/relevant history reviewed, required imaging and test results available.   Sterile Technique Maximal sterile technique including full sterile barrier drape, hand hygiene, sterile gown, sterile gloves, mask, hair covering, sterile ultrasound probe cover (if used).   Procedure Description Area of catheter insertion was cleaned with chlorhexidine and draped in sterile fashion. Without real-time ultrasound guidance an arterial catheter was placed into the right radial artery.  Appropriate arterial tracings confirmed on monitor.     Complications/Tolerance None; patient tolerated the procedure well.   EBL Minimal   Specimen(s) None  BIOPATCH applied to the insertion site.    [name], AGACNP-BC [place] [facility] Prefer epic messenger for cross cover needs If after hours, please call E-link

## 2020-11-24 NOTE — Plan of Care (Signed)
Neuro: unresponsive, attempted to wean sedation-unsuccessful due to him quickly becoming dyssynchronous with the ventilator Resp: on max ventilator support, vec x1 for ventilator compliance CV: TMAX 38.1-tylenol and ice effective, hypertension/tachycardia this afternoon-labetalol effective GIGU: foley in place, BM x 1, OG in place-tolerating feeds well Skin: clean dry and intact Social: Sister and brother-in-law spent time at the bedside with patient today. Update given by palliative NP via interpreter ipad.   Events: A line place, tolerated well-transducing for BP management, ABGs and other blood draws  Problem: Education: Goal: Knowledge of General Education information will improve Description: Including pain rating scale, medication(s)/side effects and non-pharmacologic comfort measures Outcome: Not Progressing   Problem: Health Behavior/Discharge Planning: Goal: Ability to manage health-related needs will improve Outcome: Not Progressing   Problem: Clinical Measurements: Goal: Ability to maintain clinical measurements within normal limits will improve Outcome: Not Progressing Goal: Will remain free from infection Outcome: Not Progressing Goal: Diagnostic test results will improve Outcome: Not Progressing Goal: Respiratory complications will improve Outcome: Not Progressing Goal: Cardiovascular complication will be avoided Outcome: Not Progressing   Problem: Activity: Goal: Risk for activity intolerance will decrease Outcome: Not Progressing   Problem: Nutrition: Goal: Adequate nutrition will be maintained Outcome: Not Progressing   Problem: Coping: Goal: Level of anxiety will decrease Outcome: Not Progressing   Problem: Elimination: Goal: Will not experience complications related to bowel motility Outcome: Not Progressing Goal: Will not experience complications related to urinary retention Outcome: Not Progressing   Problem: Pain Managment: Goal: General  experience of comfort will improve Outcome: Not Progressing   Problem: Safety: Goal: Ability to remain free from injury will improve Outcome: Not Progressing   Problem: Skin Integrity: Goal: Risk for impaired skin integrity will decrease Outcome: Not Progressing   Problem: Activity: Goal: Ability to tolerate increased activity will improve Outcome: Not Progressing   Problem: Clinical Measurements: Goal: Ability to maintain a body temperature in the normal range will improve Outcome: Not Progressing   Problem: Respiratory: Goal: Ability to maintain adequate ventilation will improve Outcome: Not Progressing Goal: Ability to maintain a clear airway will improve Outcome: Not Progressing

## 2020-11-24 NOTE — Progress Notes (Signed)
Called with discussed with [facility] transfer center and ICU Attending/ECMO specialist to see if pt would be a candidate for ECMO due to severe persistent Acute Hypoxic Hypercapnic Respiratory Failure despite lung protective strategies and proning.  Last P/F ratio 66.  Pt is deemed NOT a candidate for ECMO given his severe HIV/AIDS and his length of intubation being 10 days.      [name], AGACNP-BC [place] [facility] Prefer epic messenger for cross cover needs If after hours, please call E-link

## 2020-11-25 ENCOUNTER — Inpatient Hospital Stay: Payer: Medicaid Other

## 2020-11-25 LAB — CBC WITH DIFFERENTIAL/PLATELET
Abs Immature Granulocytes: 0.05 10*3/uL (ref 0.00–0.07)
Basophils Absolute: 0 10*3/uL (ref 0.0–0.1)
Basophils Relative: 0 %
Eosinophils Absolute: 0.1 10*3/uL (ref 0.0–0.5)
Eosinophils Relative: 1 %
HCT: 32 % — ABNORMAL LOW (ref 39.0–52.0)
Hemoglobin: 9.9 g/dL — ABNORMAL LOW (ref 13.0–17.0)
Immature Granulocytes: 1 %
Lymphocytes Relative: 6 %
Lymphs Abs: 0.6 10*3/uL — ABNORMAL LOW (ref 0.7–4.0)
MCH: 27.4 pg (ref 26.0–34.0)
MCHC: 30.9 g/dL (ref 30.0–36.0)
MCV: 88.6 fL (ref 80.0–100.0)
Monocytes Absolute: 0.2 10*3/uL (ref 0.1–1.0)
Monocytes Relative: 2 %
Neutro Abs: 8.9 10*3/uL — ABNORMAL HIGH (ref 1.7–7.7)
Neutrophils Relative %: 90 %
Platelets: 175 10*3/uL (ref 150–400)
RBC: 3.61 MIL/uL — ABNORMAL LOW (ref 4.22–5.81)
RDW: 15.3 % (ref 11.5–15.5)
Smear Review: NORMAL
WBC: 9.8 10*3/uL (ref 4.0–10.5)
nRBC: 0 % (ref 0.0–0.2)

## 2020-11-25 LAB — COMPREHENSIVE METABOLIC PANEL
ALT: 374 U/L — ABNORMAL HIGH (ref 0–44)
AST: 132 U/L — ABNORMAL HIGH (ref 15–41)
Albumin: 2 g/dL — ABNORMAL LOW (ref 3.5–5.0)
Alkaline Phosphatase: 354 U/L — ABNORMAL HIGH (ref 38–126)
Anion gap: 8 (ref 5–15)
BUN: 17 mg/dL (ref 6–20)
CO2: 34 mmol/L — ABNORMAL HIGH (ref 22–32)
Calcium: 8.7 mg/dL — ABNORMAL LOW (ref 8.9–10.3)
Chloride: 92 mmol/L — ABNORMAL LOW (ref 98–111)
Creatinine, Ser: 0.72 mg/dL (ref 0.61–1.24)
GFR, Estimated: >60 mL/min (ref >60)
Glucose, Bld: 205 mg/dL — ABNORMAL HIGH (ref 70–99)
Potassium: 4.5 mmol/L (ref 3.5–5.1)
Sodium: 134 mmol/L — ABNORMAL LOW (ref 135–145)
Total Bilirubin: 0.7 mg/dL (ref 0.3–1.2)
Total Protein: 6.2 g/dL — ABNORMAL LOW (ref 6.5–8.1)

## 2020-11-25 LAB — BLOOD GAS, ARTERIAL
Acid-Base Excess: 6.2 mmol/L — ABNORMAL HIGH (ref 0.0–2.0)
Acid-Base Excess: 6 mmol/L — ABNORMAL HIGH (ref 0.0–2.0)
Acid-Base Excess: 5.4 mmol/L — ABNORMAL HIGH (ref 0.0–2.0)
Bicarbonate: 32.2 mmol/L — ABNORMAL HIGH (ref 20.0–28.0)
Bicarbonate: 33.7 mmol/L — ABNORMAL HIGH (ref 20.0–28.0)
Bicarbonate: 33.8 mmol/L — ABNORMAL HIGH (ref 20.0–28.0)
FIO2: 1
FIO2: 1
FIO2: 100
MECHVT: 440 mL
MECHVT: 440 mL
Mechanical Rate: 35
O2 Saturation: 98 %
O2 Saturation: 89 %
O2 Saturation: 91.1 %
PEEP: 10 cmH2O
PEEP: 10 cmH2O
Patient temperature: 37
Patient temperature: 38
Patient temperature: 37
RATE: 35 resp/min
RATE: 35 resp/min
pCO2 arterial: 52 mmHg — ABNORMAL HIGH (ref 32.0–48.0)
pCO2 arterial: 72 mmHg (ref 32.0–48.0)
pH, Arterial: 7.4 (ref 7.350–7.450)
pH, Arterial: 7.3 — ABNORMAL LOW (ref 7.350–7.450)
pH, Arterial: 7.28 — ABNORMAL LOW (ref 7.350–7.450)
pO2, Arterial: 105 mmHg (ref 83.0–108.0)
pO2, Arterial: 66 mmHg — ABNORMAL LOW (ref 83.0–108.0)
pO2, Arterial: 69 mmHg — ABNORMAL LOW (ref 83.0–108.0)

## 2020-11-25 LAB — MAGNESIUM: Magnesium: 2.2 mg/dL (ref 1.7–2.4)

## 2020-11-25 LAB — GLUCOSE, CAPILLARY
Glucose-Capillary: 167 mg/dL — ABNORMAL HIGH (ref 70–99)
Glucose-Capillary: 198 mg/dL — ABNORMAL HIGH (ref 70–99)
Glucose-Capillary: 93 mg/dL (ref 70–99)
Glucose-Capillary: 149 mg/dL — ABNORMAL HIGH (ref 70–99)
Glucose-Capillary: 204 mg/dL — ABNORMAL HIGH (ref 70–99)
Glucose-Capillary: 176 mg/dL — ABNORMAL HIGH (ref 70–99)

## 2020-11-25 LAB — TRIGLYCERIDES: Triglycerides: 378 mg/dL — ABNORMAL HIGH (ref <150)

## 2020-11-25 LAB — PHOSPHORUS: Phosphorus: 3.9 mg/dL (ref 2.5–4.6)

## 2020-11-25 MED ORDER — VITAL 1.5 CAL PO LIQD
1000.0000 mL | ORAL | Status: DC
  Administered 2020-11-25: 1000 mL

## 2020-11-25 MED ORDER — SODIUM CHLORIDE 0.9 % IV SOLN
1.0000 mg/kg/h | INTRAVENOUS | Status: DC
Start: 2020-11-25 — End: 2020-12-03
  Administered 2020-11-25 (×4): 1.25 mg/kg/h via INTRAVENOUS
  Administered 2020-11-26 (×2): 2 mg/kg/h via INTRAVENOUS
  Administered 2020-11-26: 1.25 mg/kg/h via INTRAVENOUS
  Administered 2020-11-26 – 2020-11-27 (×3): 2 mg/kg/h via INTRAVENOUS
  Administered 2020-11-27: 2.1 mg/kg/h via INTRAVENOUS
  Administered 2020-11-27 (×2): 2 mg/kg/h via INTRAVENOUS
  Administered 2020-11-27: 2.5 mg/kg/h via INTRAVENOUS
  Administered 2020-11-27: 2.3 mg/kg/h via INTRAVENOUS
  Administered 2020-11-27 – 2020-11-28 (×4): 2.5 mg/kg/h via INTRAVENOUS
  Administered 2020-11-28 (×2): 2.3 mg/kg/h via INTRAVENOUS
  Administered 2020-11-28: 2.5 mg/kg/h via INTRAVENOUS
  Administered 2020-11-28 – 2020-11-29 (×2): 3 mg/kg/h via INTRAVENOUS
  Administered 2020-11-29 – 2020-11-30 (×13): 2.5 mg/kg/h via INTRAVENOUS
  Administered 2020-11-30: 2.52 mg/kg/h via INTRAVENOUS
  Administered 2020-11-30 – 2020-12-02 (×15): 2.5 mg/kg/h via INTRAVENOUS
  Filled 2020-11-25 (×6): qty 5
  Filled 2020-11-25: qty 500
  Filled 2020-11-25 (×6): qty 5
  Filled 2020-11-25: qty 500
  Filled 2020-11-25 (×14): qty 5
  Filled 2020-11-25: qty 500
  Filled 2020-11-25 (×4): qty 5
  Filled 2020-11-25: qty 500
  Filled 2020-11-25 (×4): qty 5
  Filled 2020-11-25: qty 500
  Filled 2020-11-25 (×5): qty 5
  Filled 2020-11-25: qty 500
  Filled 2020-11-25: qty 5
  Filled 2020-11-25: qty 500
  Filled 2020-11-25 (×4): qty 5
  Filled 2020-11-25: qty 500
  Filled 2020-11-25 (×7): qty 5
  Filled 2020-11-25: qty 500
  Filled 2020-11-25 (×2): qty 5
  Filled 2020-11-25: qty 500
  Filled 2020-11-25: qty 5
  Filled 2020-11-25: qty 500
  Filled 2020-11-25 (×5): qty 5
  Filled 2020-11-25: qty 500
  Filled 2020-11-25 (×11): qty 5
  Filled 2020-11-25: qty 500
  Filled 2020-11-25 (×5): qty 5
  Filled 2020-11-25: qty 500
  Filled 2020-11-25: qty 5
  Filled 2020-11-25: qty 500
  Filled 2020-11-25 (×20): qty 5
  Filled 2020-11-25 (×2): qty 500
  Filled 2020-11-25 (×5): qty 5
  Filled 2020-11-25: qty 500
  Filled 2020-11-25 (×26): qty 5

## 2020-11-25 MED ORDER — STERILE WATER FOR INJECTION IJ SOLN
INTRAMUSCULAR | Status: AC
  Administered 2020-11-25: 10 mL
  Filled 2020-11-25: qty 10

## 2020-11-25 MED ORDER — LABETALOL HCL 5 MG/ML IV SOLN: 10.0000 mg | INTRAVENOUS | Status: DC | PRN

## 2020-11-25 MED ORDER — PROSOURCE TF PO LIQD
45.0000 mL | Freq: Every day | ORAL | Status: DC
  Administered 2020-11-26 – 2020-12-01 (×6): 45 mL
  Filled 2020-11-25 (×8): qty 45

## 2020-11-25 MED ORDER — KETAMINE BOLUS VIA INFUSION
0.3500 mg/kg | Freq: Once | INTRAVENOUS | Status: AC
  Administered 2020-11-25: 25.41 mg via INTRAVENOUS
  Filled 2020-11-25: qty 30

## 2020-11-25 MED ORDER — HYDROMORPHONE BOLUS VIA INFUSION
1.0000 mg | INTRAVENOUS | Status: DC | PRN
  Administered 2020-11-28 – 2020-12-01 (×12): 2 mg via INTRAVENOUS
  Filled 2020-11-25: qty 2

## 2020-11-25 MED ORDER — HYDRALAZINE HCL 20 MG/ML IJ SOLN
10.0000 mg | INTRAMUSCULAR | Status: DC | PRN
  Administered 2020-11-28: 20 mg via INTRAVENOUS
  Filled 2020-11-25 (×2): qty 1

## 2020-11-25 MED ORDER — MIDAZOLAM BOLUS VIA INFUSION
2.0000 mg | INTRAVENOUS | Status: DC | PRN
  Administered 2020-11-28 – 2020-11-30 (×7): 4 mg via INTRAVENOUS
  Administered 2020-12-01: 2 mg via INTRAVENOUS
  Administered 2020-12-01 (×5): 4 mg via INTRAVENOUS
  Filled 2020-11-25: qty 4

## 2020-11-25 MED ORDER — FUROSEMIDE 10 MG/ML IJ SOLN
40.0000 mg | Freq: Every day | INTRAMUSCULAR | Status: DC
  Administered 2020-11-25: 40 mg via INTRAVENOUS
  Filled 2020-11-25: qty 4

## 2020-11-25 NOTE — Progress Notes (Signed)
NAME:  Jordan Gibson, MRN:  [id number], DOB:  [birth year], LOS: 52 ADMISSION DATE:  10/21/2020  Brief History of Present Illness   Jordan Gibson is a 40 y.o. Male with no significant past medical history who presented to [facility] ED on 11/07/20 with a 1 week history of shortness of breath, nonproductive cough, and fever. He was admitted to Acute Hypoxic Respiratory Failure and Sepsis in the setting of suspected Community Acquired Pneumonia.    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 Not on HAART. CD4 count 9, viral load 1.5 million. 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 Also tested positive for Rhinovirus/enterovirus, Syphilis, and Toxoplasma now complicated by ARDS requiring mechanical ventilation support.   Pertinent  Medical History  No significant past medical history   Significant Hospital Events: Including procedures, antibiotic start and stop dates in addition to other pertinent events  11/01/2020: Presented to ED, to be admitted to stepdown unit by hospitalist 11/07/2020: Increasing FiO2 requirements and worsening respiratory status, PCCM consulted due to high risk for deterioration and need for intubation. ABX broadened to Cefepime and Vancomycin, along with Azithromycin 11/08/20- patient with respiratory failure s/p ETT overnight. Vancomycin dcd today MRSA pcr negative. 11/09/20- Pt extubated to HFNC _0  AND 45%.  Patient with severe ARDS. I met with family and reviewed care plan. +Rhino/ENtero virus 11/10/20- patient improved, has been extubated to HFNC.  Tranferring to step down unit, signed out to [facility]. 11/12/20- FiO2 increased to 75%, dcd IV NS at 50/h, have aded lasix 20 bid, decreased prednisone to 40 once daily, stopped po vicodin.  [facility] ordered BID with RT for recruitment.  Repeat CXR today 11/13/20: Transfferd back to ICU  Due to severe acute hypoxic respiratory failure 11/14/20: Failed HHFN and BiPAP requiring emergent re- intubation. s/p bedside Bronchoscopy 11/15/20: Patient  continues to be hypoxic after increasing FiO2 from 50% to 100% & PEEP from 10 to 12. ABG post these changes revealed a PF ratio of 100 & respiratory acidosis. Proned 6/5 severe hypoxia, ARDS, AIDS, +renal failure 6/7 severe hypoxia, ALI 6/8 severe hypoxia, ARDS initiated PRONING 6/9 SEVERE HYPOXIA  6/10: Severe hypoxia, remains critically ill 6/11: Persistent issues with hypoxemia, increased sedation needs due to ventilator asynchrony 6/12: Persistent issues with ventilator asynchrony, increasing FiO2 requirements, updated sister and have made it very clear that his prognosis is exceedingly poor 11/24/20-  patient on mechanical ventilation on FiO2 100%, there was previosly recurrent dyssynchrony requiring numerous doses of paralytics to reach stable vital signs.  Complex MICU course on >8 infusions simultaneously. Reviewed in detail with RN this morning.  Very unfortunate and extremely ill young patient at high risk of death.  We considered transfer for potential ECMO - [facility] is at capacity unable to accept any patients, [facility] is at capacity unable to take any patients, [facility] able to review case but patient not candidate.  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  Reivewed with Palliative care today.  11/25/20- patient remains on PRVC 100% severe pneumonia with opportunistic infections due to advanced AIDS.  Patient is not responding to therapy with very poor prognosis  Cultures:  6/7: Blood culture x2>> 6/3: PJP PCR:  Positive 6/3: Toxoplasma PCR: negative, IgG positive 6/3: Histoplasma urinary Ag: <0.5 6/3: BAL:  Normal respiratory flora 6/1: RPR: Reactive, titer 1:4, Ab (+) 6/1: Cryptococcus Ag: (-) 5/31: Hep B/C screen: (-) 5/31: Quantiferon-TB: (-) 5/31: Histoplasma Gal[place]an: <0.5 5/31: Fungitell: >500 5/28:Mycoplasma  pneumonia ab >>negative 5/28 Tracheal aspirate: Normal respiratory flora 5/28  Respiratory panel: (+) Rhinovirus / enterovirus 5/26: SARS-CoV-2 PCR>> negative 5/26: Influenza PCR>> negative 5/26: Blood culture x2>>No growth thus far 5/26: Urine Cx>>no growth 5/27: MRSA PCR>> negative 5/27:Strep pneumo urinary antigen>>negative 5/27:Legionella urinary antigen>>negative 6/08: CMV DNA over 1 million copies   Antimicrobials:  5/26 Azithromycin >> 5/31 5/26 Cefepime >> 5/30 5/27 Vancomycin > 5/28 5/31 Bactrim >>  6/3 Penicillin G Benzathine >> 6/4 Anidulafungin >>  6/7 Zosyn>>6/9 6/10 ganciclovir>>   Antibiotics Given (last 72 hours)     Date/Time Action Medication Dose Rate   11/22/20 1035 New Bag/Given   ganciclovir (CYTOVENE) 375 mg in sodium chloride 0.9 % 100 mL IVPB 375 mg 100 mL/hr   11/22/20 1156 New Bag/Given   sulfamethoxazole-trimethoprim (BACTRIM) 375 mg of trimethoprim in dextrose 5 % 500 mL IVPB 375 mg of trimethoprim 349 mL/hr   11/22/20 1304 New Bag/Given   doxycycline (VIBRAMYCIN) 100 mg in sodium chloride 0.9 % 250 mL IVPB 100 mg 125 mL/hr   11/22/20 1828 New Bag/Given   sulfamethoxazole-trimethoprim (BACTRIM) 375 mg of trimethoprim in dextrose 5 % 500 mL IVPB 375 mg of trimethoprim 349 mL/hr   11/22/20 2257 New Bag/Given   ganciclovir (CYTOVENE) 375 mg in sodium chloride 0.9 % 100 mL IVPB 375 mg 100 mL/hr   11/23/20 0319 New Bag/Given   sulfamethoxazole-trimethoprim (BACTRIM) 375 mg of trimethoprim in dextrose 5 % 500 mL IVPB 375 mg of trimethoprim 349 mL/hr   11/23/20 1116 New Bag/Given   sulfamethoxazole-trimethoprim (BACTRIM) 375 mg of trimethoprim in dextrose 5 % 500 mL IVPB 375 mg of trimethoprim 349 mL/hr   11/23/20 1132 New Bag/Given   ganciclovir (CYTOVENE) 375 mg in sodium chloride 0.9 % 100 mL IVPB 375 mg 100 mL/hr   11/23/20 1838 New Bag/Given   sulfamethoxazole-trimethoprim (BACTRIM) 375 mg of trimethoprim in dextrose 5 % 500 mL IVPB 375 mg of trimethoprim 349 mL/hr   11/23/20 2303 New Bag/Given   ganciclovir (CYTOVENE) 375 mg  in sodium chloride 0.9 % 100 mL IVPB 375 mg 100 mL/hr   11/24/20 0241 New Bag/Given   sulfamethoxazole-trimethoprim (BACTRIM) 375 mg of trimethoprim in dextrose 5 % 500 mL IVPB 375 mg of trimethoprim 349 mL/hr   11/24/20 1116 New Bag/Given   sulfamethoxazole-trimethoprim (BACTRIM) 375 mg of trimethoprim in dextrose 5 % 500 mL IVPB 375 mg of trimethoprim 349 mL/hr   11/24/20 1418 New Bag/Given   ganciclovir (CYTOVENE) 375 mg in sodium chloride 0.9 % 100 mL IVPB 375 mg 100 mL/hr   11/24/20 1419 Given   primaquine tablet 30 mg 30 mg    11/24/20 1506 New Bag/Given   clindamycin (CLEOCIN) IVPB 900 mg 900 mg 100 mL/hr   11/24/20 1948 Given   atovaquone (MEPRON) 750 MG/5ML suspension 1,500 mg 1,500 mg    11/24/20 2119 New Bag/Given   clindamycin (CLEOCIN) IVPB 900 mg 900 mg 100 mL/hr   11/24/20 2302 New Bag/Given   ganciclovir (CYTOVENE) 375 mg in sodium chloride 0.9 % 100 mL IVPB 375 mg 100 mL/hr   11/25/20 0543 New Bag/Given   clindamycin (CLEOCIN) IVPB 900 mg 900 mg 100 mL/hr      Scheduled Meds:  atovaquone  1,500 mg Per Tube BID   chlorhexidine gluconate (MEDLINE KIT)  15 mL Mouth Rinse BID   Chlorhexidine Gluconate Cloth  6 each Topical q morning   docusate  100 mg Per Tube BID   enoxaparin (LOVENOX) injection  40 mg Subcutaneous Q24H  feeding supplement (PROSource TF)  45 mL Per Tube TID   free water  20 mL Per Tube Q4H   insulin aspart  0-20 Units Subcutaneous Q4H   insulin aspart  3 Units Subcutaneous Q4H   insulin glargine  7 Units Subcutaneous QHS   LORazepam  1 mg Per Tube Q4H   mouth rinse  15 mL Mouth Rinse 10 times per day   methylPREDNISolone (SOLU-MEDROL) injection  40 mg Intravenous Q12H   oxyCODONE  15 mg Per Tube Q6H   pantoprazole (PROTONIX) IV  40 mg Intravenous Daily   penicillin g benzathine (BICILLIN-LA) IM  2.4 Million Units Intramuscular Weekly   polyethylene glycol  17 g Per Tube Daily   primaquine  30 mg Per Tube Daily   QUEtiapine  25 mg Per Tube QHS    sodium chloride  2 g Per Tube TID WC   sodium zirconium cyclosilicate  10 g Per Tube Daily   Continuous Infusions:  sodium chloride 5 mL/hr at 11/25/20 0600   clindamycin (CLEOCIN) IV 900 mg (11/25/20 0543)   dexmedetomidine (PRECEDEX) IV infusion 1.5 mcg/kg/hr (11/25/20 0908)   feeding supplement (VITAL 1.5 CAL) 55 mL/hr at 11/25/20 0600   ganciclovir 375 mg (11/24/20 2302)   HYDROmorphone 4 mg/hr (11/25/20 0600)   ketamine (KETALAR) Adult IV Infusion 1.25 mg/kg/hr (11/25/20 0600)   midazolam 10 mg/hr (11/25/20 0840)   norepinephrine (LEVOPHED) Adult infusion Stopped (11/23/20 1433)   PRN Meds:.acetaminophen, acetaminophen, hydrALAZINE, HYDROmorphone, labetalol, midazolam, ondansetron **OR** ondansetron (ZOFRAN) IV, vecuronium  Chest x-ray today:      Objective   Blood pressure (!) 101/52, pulse (!) 101, temperature (!) 100.94 F (38.3 C), resp. rate (!) 23, height 5' 7.01" (1.702 m), weight 72.8 kg, SpO2 91 %.    Vent Mode: PRVC FiO2 (%):  [100 %] 100 % Set Rate:  [35 bmp] 35 bmp Vt Set:  [440 mL] 440 mL PEEP:  [10 cmH20] 10 cmH20 Plateau Pressure:  [33 cmH20] 33 cmH20   Intake/Output Summary (Last 24 hours) at 11/25/2020 1027 Last data filed at 11/25/2020 0600 Gross per 24 hour  Intake 2907.12 ml  Output 6035 ml  Net -3127.88 ml    Filed Weights   11/22/20 0424 11/23/20 0500 11/25/20 0405  Weight: 72.7 kg 72.6 kg 72.8 kg    REVIEW OF SYSTEMS  PATIENT IS UNABLE TO PROVIDE COMPLETE REVIEW OF SYSTEMS DUE TO SEVERE CRITICAL ILLNESS AND TOXIC METABOLIC ENCEPHALOPATHY   PHYSICAL EXAMINATION:  GENERAL: Critically ill-appearing male, laying in bed, intubated and sedated, asynchronous with the vent, tachypneic even on the vent. HEAD: Normocephalic, atraumatic.  EYES: Pupils equal, round, reactive to light.  No scleral icterus.  MOUTH: Moist mucosal membrane.  ET tube in place NECK: Supple, + JVD 4+ PULMONARY: Coarse breath sounds bilaterally, no wheezing,  overbreathing the vent, even CARDIOVASCULAR: S1 and S2. Regular rate and rhythm. No murmurs, rubs, or gallops.  GASTROINTESTINAL: Soft, nontender, -distended. Positive bowel sounds.  MUSCULOSKELETAL: No swelling, clubbing, or edema.  NEUROLOGIC: Heavily sedated due to ARDS, does not follow commands, pupils PERRLA SKIN: Warm and diaphoretic.  No obvious rashes, lesions, ulceration   Labs/imaging that I havepersonally reviewed  (right click and "Reselect all SmartList Selections" daily)  Labs 11/21/20: Na 129, glucose 265, BUN 13, Cr. 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 leading to severe hypoxic resp failure  intubated x 2 with severe b/l ling disease and ARDS/ALI transient renal failrue now ith elevated liver tests with liver dysfunction +PJP PNEUMONIA +TOXO +SYPHILIS +RHINOVIRUS +CMV  Assessment / Plan:   Acute Hypoxic and Hypercapnic Respiratory Failure due to PJP Pneumonia Severe ARDS -Full vent support, implement lung protective strategies via ARDS Net Protocol -Reiterated ARDS protocol with VT 400 equals to 6 mL/kg, ABG to follow -Wean FiO2 and PEEP as tolerated maintain O2 sats greater than 90% -Follow intermittent chest x-ray and ABG as needed -Spontaneous breathing trials when respiratory parameters met -Continue VAP bundle -As needed bronchodilators -Continue antibiotics  Septic Shock -Continuous cardiac monitoring -Maintain MAP greater than 65 -IV fluids -Vasopressors as needed to maintain MAP goal -2D Echo 11/08/20 :LVEF 60-65%, normal RV systolic function -Persistent shock physiology bodes for very poor prognosis -Persistent requirement of vasopressors.  Severe Sepsis due to PJP Pneumonia Newly diagnosed AIDS Persistent Fever -Monitor fever curve -Trend WBCs -Follow cultures -ID following, antibiotics as per  ID -Currently on Bactrim after CMV titer back. Bactrim better for PJP/Toxo -CD4 is 7 and VL 1.5 million copies -c/w HAART after 7-10 days of PCP treatment per ID-monitor for IRIS -gancyclovir for CMV > 1 million copies  Hyponatremia Volume overload -Monitor I&O's / urinary output -Follow BMP -Ensure adequate renal perfusion -Avoid nephrotoxic agents as able -Replace electrolytes as indicated -Repeat Lasix x1today  Mild Transaminitis -Negative viral hepatitis panel -Trend LFT's  Acute toxic metabolic encephalopathy, need for sedation -Goal RASS -2 to -3 given severe ARDS -Dilaudid & Versed, and Precedex as needed to maintain RASS goal -Avoid sedating meds as able -SAT as tolerates, not stable for same due to high FiO2 requirement -Provide supportive care  Hyperglycemia -CBG's -SSI -Follow ICU Hypo/Hyperglycemia protocol   Pt is critically ill.  Prognosis is guarded. High risk for cardiac arrest and death.  Best practice (right click and "Reselect all SmartList Selections" daily)  Diet:  Tube Feed Pain/Anxiety/Delirium protocol (if indicated): Yes (RASS goal -2 to -3) VAP protocol (if indicated): Yes DVT prophylaxis: Lovenox GI prophylaxis: PPI Glucose control:  SSI Yes Central venous access:  Yes, and it is still needed Arterial line:  N/A Foley:  Yes, and it is still needed Mobility:  bed rest  PT consulted: N/A Last date of multidisciplinary goals of care discussion [11/23/20] we will place palliative care consultation Code Status:  full code Disposition: ICU    Labs   CBC: Recent Labs  Lab 11/21/20 0511 11/22/20 0415 11/23/20 0528 11/24/20 0435 11/25/20 0510  WBC 8.0 9.2 11.5* 9.6 9.8  NEUTROABS 7.4 8.6* 10.5* 8.8* 8.9*  HGB 9.7* 9.8* 9.7* 9.4* 9.9*  HCT 29.5* 30.5* 30.7* 30.3* 32.0*  MCV 85.0 85.9 87.2 89.4 88.6  PLT 198 175 183 167 175     Basic Metabolic Panel: Recent Labs  Lab 11/21/20 0511 11/22/20 0415 11/23/20 0528 11/24/20 0435  11/25/20 0510  NA 129* 128* 131* 129* 134*  K 4.3 4.7 4.7 5.1 4.5  CL 92* 93* 94* 91* 92*  CO2 29 30 32 34* 34*  GLUCOSE 265* 158* 179* 261* 205*  BUN _0 CREATININE 0.56* 0.54* 0.56* 0.58* 0.72  CALCIUM 8.0* 8.2* 8.2* 7.9* 8.7*  MG 2.1 1.9 2.0 2.1 2.2  PHOS 3.7 3.2 4.1 3.6 3.9    GFR: Estimated Creatinine Clearance: 113.6 mL/min (by C-G formula based on SCr of 0.72 mg/dL). Recent Labs  Lab 11/22/20 0415 11/23/20 0528 11/24/20 0435 11/25/20 0510  WBC 9.2 11.5*  9.6 9.8     Liver Function Tests: Recent Labs  Lab 11/19/20 0507 11/21/20 0511 11/23/20 0528 11/24/20 0435 11/25/20 0510  AST 63* 73* 217* 221* 132*  ALT 110* 139* 366* 454* 374*  ALKPHOS 130* 149* 309* 366* 354*  BILITOT 0.3 0.4 0.5 0.6 0.7  PROT 5.3* 5.1* 5.4* 5.5* 6.2*  ALBUMIN 1.9* 1.7* 1.9* 1.8* 2.0*    No results for input(s): LIPASE, AMYLASE in the last 168 hours. No results for input(s): AMMONIA in the last 168 hours.  ABG    Component Value Date/Time   PHART 7.28 (L) 11/25/2020 0500   PCO2ART 72 (HH) 11/25/2020 0500   PO2ART 69 (L) 11/25/2020 0500   HCO3 33.8 (H) 11/25/2020 0500   ACIDBASEDEF 2.3 (H) 11/16/2020 0441   O2SAT 91.1 11/25/2020 0500      Coagulation Profile: No results for input(s): INR, PROTIME in the last 168 hours.  Cardiac Enzymes: Recent Labs  Lab 11/23/20 0528  CKTOTAL 19*     HbA1C: Hgb A1c MFr Bld  Date/Time Value Ref Range Status  11/12/2020 05:37 AM 5.9 (H) 4.8 - 5.6 % Final    Comment:    (NOTE)         Prediabetes: 5.7 - 6.4         Diabetes: >6.4         Glycemic control for adults with diabetes: <7.0     CBG: Recent Labs  Lab 11/24/20 1955 11/24/20 2017 11/24/20 2335 11/25/20 0358 11/25/20 0730  GLUCAP 139* 142* 138* 167* 198*     Allergies No Known Allergies   Critical care provider statement:    Critical care time (minutes):  33   Critical care time was exclusive of:  Separately billable procedures and  treating  other patients   Critical care was necessary to treat or prevent imminent or  life-threatening deterioration of the following conditions:  AIDS, ARDS, JPJ pna, CMV pneumnia, severe ARDS   Critical care was time spent personally by me on the following  activities:  Development of treatment plan with patient or surrogate,  discussions with consultants, evaluation of patient's response to  treatment, examination of patient, obtaining history from patient or  surrogate, ordering and performing treatments and interventions, ordering  and review of laboratory studies and re-evaluation of patient's condition   I assumed direction of critical care for this patient from another  provider in my specialty: no     *This note was dictated using voice recognition software/Dragon.  Despite best efforts to proofread, errors can occur which can change the meaning.  Any change was purely unintentional.    Ottie Glazier, M.D.  [facility] ]

## 2020-11-25 NOTE — Progress Notes (Signed)
Nutrition Brief Follow-up  Propofol discontinued   INTERVENTION:   Increase Vital 1.5 to 67ml/hr + ProSource 25ml daily via tube   Free water flushes 60ml q4 hours to maintain tube patency   Regimen provides 2560kcal/day, 125g/day protein and 1447ml/day free water   Estimated Nutritional Needs:   Kcal:  2200-2500kcal/day  Protein:  110-125g/day  Fluid:  2.1-2.4L/day  Betsey Holiday MS, RD, LDN Please refer to [facility] for RD and/or RD on-call/weekend/after hours pager

## 2020-11-26 LAB — MISC LABCORP TEST (SEND OUT): Labcorp test code: 138651

## 2020-11-26 LAB — CBC WITH DIFFERENTIAL/PLATELET
Abs Immature Granulocytes: 0.06 10*3/uL (ref 0.00–0.07)
Basophils Absolute: 0 10*3/uL (ref 0.0–0.1)
Basophils Relative: 0 %
Eosinophils Absolute: 0.1 10*3/uL (ref 0.0–0.5)
Eosinophils Relative: 1 %
HCT: 34.1 % — ABNORMAL LOW (ref 39.0–52.0)
Hemoglobin: 10.6 g/dL — ABNORMAL LOW (ref 13.0–17.0)
Immature Granulocytes: 1 %
Lymphocytes Relative: 8 %
Lymphs Abs: 0.8 10*3/uL (ref 0.7–4.0)
MCH: 27.6 pg (ref 26.0–34.0)
MCHC: 31.1 g/dL (ref 30.0–36.0)
MCV: 88.8 fL (ref 80.0–100.0)
Monocytes Absolute: 0.4 10*3/uL (ref 0.1–1.0)
Monocytes Relative: 4 %
Neutro Abs: 8.6 10*3/uL — ABNORMAL HIGH (ref 1.7–7.7)
Neutrophils Relative %: 86 %
Platelets: 198 10*3/uL (ref 150–400)
RBC: 3.84 MIL/uL — ABNORMAL LOW (ref 4.22–5.81)
RDW: 15.5 % (ref 11.5–15.5)
WBC: 9.9 10*3/uL (ref 4.0–10.5)
nRBC: 0 % (ref 0.0–0.2)

## 2020-11-26 LAB — COMPREHENSIVE METABOLIC PANEL
ALT: 254 U/L — ABNORMAL HIGH (ref 0–44)
AST: 64 U/L — ABNORMAL HIGH (ref 15–41)
Albumin: 2.1 g/dL — ABNORMAL LOW (ref 3.5–5.0)
Alkaline Phosphatase: 338 U/L — ABNORMAL HIGH (ref 38–126)
Anion gap: 11 (ref 5–15)
BUN: 17 mg/dL (ref 6–20)
CO2: 38 mmol/L — ABNORMAL HIGH (ref 22–32)
Calcium: 8.5 mg/dL — ABNORMAL LOW (ref 8.9–10.3)
Chloride: 87 mmol/L — ABNORMAL LOW (ref 98–111)
Creatinine, Ser: 0.55 mg/dL — ABNORMAL LOW (ref 0.61–1.24)
GFR, Estimated: >60 mL/min (ref >60)
Glucose, Bld: 157 mg/dL — ABNORMAL HIGH (ref 70–99)
Potassium: 4.2 mmol/L (ref 3.5–5.1)
Sodium: 136 mmol/L (ref 135–145)
Total Bilirubin: 0.9 mg/dL (ref 0.3–1.2)
Total Protein: 6.3 g/dL — ABNORMAL LOW (ref 6.5–8.1)

## 2020-11-26 LAB — GLUCOSE, CAPILLARY
Glucose-Capillary: 139 mg/dL — ABNORMAL HIGH (ref 70–99)
Glucose-Capillary: 175 mg/dL — ABNORMAL HIGH (ref 70–99)
Glucose-Capillary: 63 mg/dL — ABNORMAL LOW (ref 70–99)
Glucose-Capillary: 85 mg/dL (ref 70–99)
Glucose-Capillary: 94 mg/dL (ref 70–99)
Glucose-Capillary: 81 mg/dL (ref 70–99)

## 2020-11-26 LAB — ACID FAST SMEAR (AFB, MYCOBACTERIA): Acid Fast Smear: NEGATIVE

## 2020-11-26 LAB — MAGNESIUM: Magnesium: 2 mg/dL (ref 1.7–2.4)

## 2020-11-26 LAB — TRIGLYCERIDES: Triglycerides: 455 mg/dL — ABNORMAL HIGH (ref <150)

## 2020-11-26 LAB — PHOSPHORUS: Phosphorus: 3.6 mg/dL (ref 2.5–4.6)

## 2020-11-26 LAB — FUNGITELL, SERUM: Fungitell Result: 136 pg/mL — ABNORMAL HIGH (ref <80)

## 2020-11-26 MED ORDER — STERILE WATER FOR INJECTION IJ SOLN
INTRAMUSCULAR | Status: AC
  Administered 2020-11-26: 10 mL
  Filled 2020-11-26: qty 10

## 2020-11-26 MED ORDER — ESMOLOL HCL-SODIUM CHLORIDE 2000 MG/100ML IV SOLN
25.0000 ug/kg/min | INTRAVENOUS | Status: DC
  Administered 2020-11-26 – 2020-11-27 (×2): 25 ug/kg/min via INTRAVENOUS
  Filled 2020-11-26 (×3): qty 100

## 2020-11-26 MED ORDER — DEXTROSE 50 % IV SOLN: 12.5000 g | Freq: Once | INTRAVENOUS | Status: AC

## 2020-11-26 MED ORDER — DOLUTEGRAVIR SODIUM 50 MG PO TABS
50.0000 mg | ORAL_TABLET | Freq: Every day | ORAL | Status: DC
  Administered 2020-11-27 – 2020-12-01 (×5): 50 mg
  Filled 2020-11-26 (×6): qty 1

## 2020-11-26 MED ORDER — FUROSEMIDE 10 MG/ML IJ SOLN
INTRAMUSCULAR | Status: AC
  Filled 2020-11-26: qty 4

## 2020-11-26 MED ORDER — FUROSEMIDE 10 MG/ML IJ SOLN
40.0000 mg | INTRAMUSCULAR | Status: AC
  Administered 2020-11-26: 40 mg via INTRAVENOUS

## 2020-11-26 MED ORDER — FUROSEMIDE 10 MG/ML IJ SOLN: 40.0000 mg | Freq: Every day | INTRAMUSCULAR | Status: DC

## 2020-11-26 MED ORDER — EMTRICITABINE-TENOFOVIR AF 200-25 MG PO TABS
1.0000 | ORAL_TABLET | Freq: Every day | ORAL | Status: DC
  Administered 2020-11-27 – 2020-12-01 (×5): 1
  Filled 2020-11-26 (×6): qty 1

## 2020-11-26 MED ORDER — DEXTROSE 50 % IV SOLN
INTRAVENOUS | Status: AC
  Administered 2020-11-26: 12.5 g via INTRAVENOUS
  Filled 2020-11-26: qty 50

## 2020-11-26 MED ORDER — PHENYLEPHRINE CONCENTRATED 100MG/250ML (0.4 MG/ML) INFUSION SIMPLE
0.0000 ug/min | INTRAVENOUS | Status: DC
  Filled 2020-11-26: qty 250

## 2020-11-26 NOTE — Progress Notes (Signed)
Pt's o2 Sats dropped on the moni tor had good wave forms was co relating. Pt was grey in color.Suction done by RT. Inj Lasix 40 mg given as ordered.Pt sats increased  78-81%. Resting in bed Will continue to observe closey.

## 2020-11-26 NOTE — Progress Notes (Signed)
NAME:  Jordan Gibson, MRN:  [id number], DOB:  05-06-1981, LOS: 39 ADMISSION DATE:  10/25/2020  Brief History of Present Illness   Jordan Gibson is a 40 y.o. Male with no significant past medical history who presented to [facility] ED on 11/07/20 with a 1 week history of shortness of breath, nonproductive cough, and fever. He was admitted to Acute Hypoxic Respiratory Failure and Sepsis in the setting of suspected Community Acquired Pneumonia.    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 Not on HAART. CD4 count 9, viral load 1.5 million. 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 Also tested positive for Rhinovirus/enterovirus, Syphilis, and Toxoplasma now complicated by ARDS requiring mechanical ventilation support.   Pertinent  Medical History  No significant past medical history   Significant Hospital Events: Including procedures, antibiotic start and stop dates in addition to other pertinent events  11/10/2020: Presented to ED, to be admitted to stepdown unit by hospitalist 11/07/2020: Increasing FiO2 requirements and worsening respiratory status, PCCM consulted due to high risk for deterioration and need for intubation. ABX broadened to Cefepime and Vancomycin, along with Azithromycin 11/08/20- patient with respiratory failure s/p ETT overnight. Vancomycin dcd today MRSA pcr negative. 11/09/20- Pt extubated to HFNC _0  AND 45%.  Patient with severe ARDS. I met with family and reviewed care plan. +Rhino/ENtero virus 11/10/20- patient improved, has been extubated to HFNC.  Tranferring to step down unit, signed out to [facility]. 11/12/20- FiO2 increased to 75%, dcd IV NS at 50/h, have aded lasix 20 bid, decreased prednisone to 40 once daily, stopped po vicodin.  Metane[employer] ordered BID with RT for recruitment.  Repeat CXR today 11/13/20: Transfferd back to ICU  Due to severe acute hypoxic respiratory failure 11/14/20: Failed HHFN and BiPAP requiring emergent re- intubation. s/p bedside Bronchoscopy 11/15/20: Patient  continues to be hypoxic after increasing FiO2 from 50% to 100% & PEEP from 10 to 12. ABG post these changes revealed a PF ratio of 100 & respiratory acidosis. Proned 6/5 severe hypoxia, ARDS, AIDS, +renal failure 6/7 severe hypoxia, ALI 6/8 severe hypoxia, ARDS initiated PRONING 6/9 SEVERE HYPOXIA  6/10: Severe hypoxia, remains critically ill 6/11: Persistent issues with hypoxemia, increased sedation needs due to ventilator asynchrony 6/12: Persistent issues with ventilator asynchrony, increasing FiO2 requirements, updated sister and have made it very clear that his prognosis is exceedingly poor 11/24/20-  patient on mechanical ventilation on FiO2 100%, there was previosly recurrent dyssynchrony requiring numerous doses of paralytics to reach stable vital signs.  Complex MICU course on >8 infusions simultaneously. Reviewed in detail with RN this morning.  Very unfortunate and extremely ill young patient at high risk of death.  We considered transfer for potential ECMO - [facility] is at capacity unable to accept any patients, [facility] is at capacity unable to take any patients, [facility] able to review case but patient not candidate.  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  Reivewed with Palliative care today.  11/25/20- patient remains on PRVC 100% severe pneumonia with opportunistic infections due to advanced AIDS.  Patient is not responding to therapy with very poor prognosis 11/26/20- patient is worse overnight, he is desaturated to 85% on Fio2 100%.  Reviewed plan with ID and will initiate AIDS therapy today. Met with sister today reviewed goals of care recommendation from me for DNR.  Family wants to keep full code.   Cultures:  6/7: Blood culture x2>> 6/3: PJP PCR:  Positive 6/3: Toxoplasma PCR: negative, IgG positive 6/3: Histoplasma urinary Ag: <0.5 6/3: BAL:  Normal respiratory flora 6/1: RPR: Reactive, titer  1:4, Ab (+) 6/1: Cryptococcus Ag: (-) 5/31: Hep B/C screen: (-) 5/31: Quantiferon-TB: (-) 5/31: Histoplasma Gal[place]an: <0.5 5/31: Fungitell: >500 5/28:Mycoplasma pneumonia ab >>negative 5/28 Tracheal aspirate: Normal respiratory flora 5/28 Respiratory panel: (+) Rhinovirus / enterovirus 5/26: SARS-CoV-2 PCR>> negative 5/26: Influenza PCR>> negative 5/26: Blood culture x2>>No growth thus far 5/26: Urine Cx>>no growth 5/27: MRSA PCR>> negative 5/27:Strep pneumo urinary antigen>>negative 5/27:Legionella urinary antigen>>negative 6/08: CMV DNA over 1 million copies   Antimicrobials:  5/26 Azithromycin >> 5/31 5/26 Cefepime >> 5/30 5/27 Vancomycin > 5/28 5/31 Bactrim >>  6/3 Penicillin G Benzathine >> 6/4 Anidulafungin >>  6/7 Zosyn>>6/9 6/10 ganciclovir>>   Antibiotics Given (last 72 hours)     Date/Time Action Medication Dose Rate   11/23/20 1116 New Bag/Given   sulfamethoxazole-trimethoprim (BACTRIM) 375 mg of trimethoprim in dextrose 5 % 500 mL IVPB 375 mg of trimethoprim 349 mL/hr   11/23/20 1132 New Bag/Given   ganciclovir (CYTOVENE) 375 mg in sodium chloride 0.9 % 100 mL IVPB 375 mg 100 mL/hr   11/23/20 1838 New Bag/Given   sulfamethoxazole-trimethoprim (BACTRIM) 375 mg of trimethoprim in dextrose 5 % 500 mL IVPB 375 mg of trimethoprim 349 mL/hr   11/23/20 2303 New Bag/Given   ganciclovir (CYTOVENE) 375 mg in sodium chloride 0.9 % 100 mL IVPB 375 mg 100 mL/hr   11/24/20 0241 New Bag/Given   sulfamethoxazole-trimethoprim (BACTRIM) 375 mg of trimethoprim in dextrose 5 % 500 mL IVPB 375 mg of trimethoprim 349 mL/hr   11/24/20 1116 New Bag/Given   sulfamethoxazole-trimethoprim (BACTRIM) 375 mg of trimethoprim in dextrose 5 % 500 mL IVPB 375 mg of trimethoprim 349 mL/hr   11/24/20 1418 New Bag/Given   ganciclovir (CYTOVENE) 375 mg in sodium chloride 0.9 % 100 mL IVPB 375 mg 100 mL/hr   11/24/20 1419 Given   primaquine tablet 30 mg 30 mg    11/24/20 1506 New Bag/Given    clindamycin (CLEOCIN) IVPB 900 mg 900 mg 100 mL/hr   11/24/20 1948 Given   atovaquone (MEPRON) 750 MG/5ML suspension 1,500 mg 1,500 mg    11/24/20 2119 New Bag/Given   clindamycin (CLEOCIN) IVPB 900 mg 900 mg 100 mL/hr   11/24/20 2302 New Bag/Given   ganciclovir (CYTOVENE) 375 mg in sodium chloride 0.9 % 100 mL IVPB 375 mg 100 mL/hr   11/25/20 0543 New Bag/Given   clindamycin (CLEOCIN) IVPB 900 mg 900 mg 100 mL/hr   11/25/20 0950 Given   primaquine tablet 30 mg 30 mg    11/25/20 0950 Given   atovaquone (MEPRON) 750 MG/5ML suspension 1,500 mg 1,500 mg    11/25/20 0951 Given  [Bilateral glutes]   penicillin g benzathine (BICILLIN LA) 1200000 UNIT/2ML injection 2.4 Million Units 2.4 Million Units    11/25/20 1129 New Bag/Given   ganciclovir (CYTOVENE) 375 mg in sodium chloride 0.9 % 100 mL IVPB 375 mg 100 mL/hr   11/25/20 1339 New Bag/Given   clindamycin (CLEOCIN) IVPB 900 mg 900 mg 100 mL/hr   11/25/20 2131 Given   atovaquone (MEPRON) 750 MG/5ML suspension 1,500 mg 1,500 mg    11/25/20 2133 New Bag/Given   clindamycin (CLEOCIN) IVPB 900 mg 900 mg 100 mL/hr   11/25/20 2233 New Bag/Given   ganciclovir (CYTOVENE) 375 mg in sodium chloride 0.9 % 100 mL IVPB 375 mg 100 mL/hr   11/26/20 0527 New Bag/Given   clindamycin (CLEOCIN)  IVPB 900 mg 900 mg 100 mL/hr      Scheduled Meds:  atovaquone  1,500 mg Per Tube BID   chlorhexidine gluconate (MEDLINE KIT)  15 mL Mouth Rinse BID   Chlorhexidine Gluconate Cloth  6 each Topical q morning   docusate  100 mg Per Tube BID   enoxaparin (LOVENOX) injection  40 mg Subcutaneous Q24H   feeding supplement (PROSource TF)  45 mL Per Tube Daily   free water  20 mL Per Tube Q4H   furosemide       furosemide  40 mg Intravenous Daily   insulin aspart  0-20 Units Subcutaneous Q4H   insulin aspart  3 Units Subcutaneous Q4H   insulin glargine  7 Units Subcutaneous QHS   LORazepam  1 mg Per Tube Q4H   mouth rinse  15 mL Mouth Rinse 10 times per day    methylPREDNISolone (SOLU-MEDROL) injection  40 mg Intravenous Q12H   oxyCODONE  15 mg Per Tube Q6H   pantoprazole (PROTONIX) IV  40 mg Intravenous Daily   penicillin g benzathine (BICILLIN-LA) IM  2.4 Million Units Intramuscular Weekly   polyethylene glycol  17 g Per Tube Daily   primaquine  30 mg Per Tube Daily   QUEtiapine  25 mg Per Tube QHS   sodium chloride  2 g Per Tube TID WC   Continuous Infusions:  sodium chloride 5 mL/hr at 11/25/20 1900   clindamycin (CLEOCIN) IV 900 mg (11/26/20 0527)   dexmedetomidine (PRECEDEX) IV infusion 1.2 mcg/kg/hr (11/26/20 0600)   feeding supplement (VITAL 1.5 CAL) 70 mL/hr at 11/26/20 0600   ganciclovir 375 mg (11/25/20 2233)   HYDROmorphone 4 mg/hr (11/26/20 0600)   ketamine (KETALAR) Adult IV Infusion 1.25 mg/kg/hr (11/26/20 0600)   midazolam 10 mg/hr (11/26/20 0641)   PRN Meds:.acetaminophen, acetaminophen, hydrALAZINE, HYDROmorphone, labetalol, midazolam, ondansetron **OR** ondansetron (ZOFRAN) IV, vecuronium  Chest x-ray today:      Objective   Blood pressure 111/72, pulse (!) 126, temperature 99.68 F (37.6 C), resp. rate 15, height 5' 7.01" (1.702 m), weight 72.8 kg, SpO2 (!) 76 %.    Vent Mode: PRVC FiO2 (%):  [100 %] 100 % Set Rate:  [35 bmp] 35 bmp Vt Set:  [420 mL-440 mL] 420 mL PEEP:  [10 cmH20-15 cmH20] 15 cmH20 Plateau Pressure:  [34 cmH20] 34 cmH20   Intake/Output Summary (Last 24 hours) at 11/26/2020 0824 Last data filed at 11/26/2020 0600 Gross per 24 hour  Intake 2902.56 ml  Output 4800 ml  Net -1897.44 ml    Filed Weights   11/22/20 0424 11/23/20 0500 11/25/20 0405  Weight: 72.7 kg 72.6 kg 72.8 kg    REVIEW OF SYSTEMS  PATIENT IS UNABLE TO PROVIDE COMPLETE REVIEW OF SYSTEMS DUE TO SEVERE CRITICAL ILLNESS AND TOXIC METABOLIC ENCEPHALOPATHY   PHYSICAL EXAMINATION:  GENERAL: Critically ill-appearing male, laying in bed, intubated and sedated, asynchronous with the vent, tachypneic even on the vent. HEAD:  Normocephalic, atraumatic.  EYES: Pupils equal, round, reactive to light.  No scleral icterus.  MOUTH: Moist mucosal membrane.  ET tube in place NECK: Supple, + JVD 4+ PULMONARY: Coarse breath sounds bilaterally, no wheezing, overbreathing the vent, even CARDIOVASCULAR: S1 and S2. Regular rate and rhythm. No murmurs, rubs, or gallops.  GASTROINTESTINAL: Soft, nontender, -distended. Positive bowel sounds.  MUSCULOSKELETAL: No swelling, clubbing, or edema.  NEUROLOGIC: sedated on mv GCS4T SKIN: Warm and diaphoretic.  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 leading to severe hypoxic resp failure intubated x 2 with severe b/l ling disease and ARDS/ALI transient renal failrue now ith elevated liver tests with liver dysfunction +PJP PNEUMONIA +TOXO +SYPHILIS +RHINOVIRUS +CMV  Assessment / Plan:   Acute Hypoxic and Hypercapnic Respiratory Failure due to PJP Pneumonia Severe ARDS -Full vent support, implement lung protective strategies via ARDS Net Protocol -Reiterated ARDS protocol with VT 400 equals to 6 mL/kg, ABG to follow -Wean FiO2 and PEEP as tolerated maintain O2 sats greater than 90% -Follow intermittent chest x-ray and ABG as needed -Spontaneous breathing trials when respiratory parameters met -Continue VAP bundle -As needed bronchodilators -Continue antibiotics  Septic Shock -Continuous cardiac monitoring -Maintain MAP greater than 65 -IV fluids -Vasopressors as needed to maintain MAP goal -2D Echo 11/08/20 :LVEF 60-65%, normal RV systolic function -Persistent shock physiology bodes for very poor prognosis -Persistent requirement of vasopressors.  Severe Sepsis due to PJP Pneumonia Newly diagnosed AIDS Persistent Fever -Monitor fever  curve -Trend WBCs -Follow cultures -ID following, antibiotics as per ID -Currently on Bactrim after CMV titer back. Bactrim better for PJP/Toxo -CD4 is 7 and VL 1.5 million copies -c/w HAART after 7-10 days of PCP treatment per ID-monitor for IRIS -gancyclovir for CMV > 1 million copies  Hyponatremia Volume overload -Monitor I&O's / urinary output -Follow BMP -Ensure adequate renal perfusion -Avoid nephrotoxic agents as able -Replace electrolytes as indicated -Repeat Lasix x1today  Mild Transaminitis -Negative viral hepatitis panel -Trend LFT's  Acute toxic metabolic encephalopathy, need for sedation -Goal RASS -2 to -3 given severe ARDS -Dilaudid & Versed, and Precedex as needed to maintain RASS goal -Avoid sedating meds as able -SAT as tolerates, not stable for same due to high FiO2 requirement -Provide supportive care  Hyperglycemia -CBG's -SSI -Follow ICU Hypo/Hyperglycemia protocol   Pt is critically ill.  Prognosis is guarded. High risk for cardiac arrest and death.  Best practice (right click and "Reselect all SmartList Selections" daily)  Diet:  Tube Feed Pain/Anxiety/Delirium protocol (if indicated): Yes (RASS goal -2 to -3) VAP protocol (if indicated): Yes DVT prophylaxis: Lovenox GI prophylaxis: PPI Glucose control:  SSI Yes Central venous access:  Yes, and it is still needed Arterial line:  N/A Foley:  Yes, and it is still needed Mobility:  bed rest  PT consulted: N/A Last date of multidisciplinary goals of care discussion [11/23/20] we will place palliative care consultation Code Status:  full code Disposition: ICU    Labs   CBC: Recent Labs  Lab 11/22/20 0415 11/23/20 0528 11/24/20 0435 11/25/20 0510 11/26/20 0520  WBC 9.2 11.5* 9.6 9.8 9.9  NEUTROABS 8.6* 10.5* 8.8* 8.9* 8.6*  HGB 9.8* 9.7* 9.4* 9.9* 10.6*  HCT 30.5* 30.7* 30.3* 32.0* 34.1*  MCV 85.9 87.2 89.4 88.6 88.8  PLT 175 183 167 175 198     Basic Metabolic Panel: Recent  Labs  Lab 11/22/20 0415 11/23/20 0528 11/24/20 0435 11/25/20 0510 11/26/20 0520  NA 128* 131* 129* 134* 136  K 4.7 4.7 5.1 4.5 4.2  CL 93* 94* 91* 92* 87*  CO2 30 32 34* 34* 38*  GLUCOSE 158* 179* 261* 205* 157*  BUN _0 CREATININE 0.54* 0.56* 0.58* 0.72 0.55*  CALCIUM 8.2* 8.2* 7.9* 8.7* 8.5*  MG 1.9 2.0 2.1 2.2 2.0  PHOS 3.2 4.1 3.6 3.9 3.6  GFR: Estimated Creatinine Clearance: 113.6 mL/min (A) (by C-G formula based on SCr of 0.55 mg/dL (L)). Recent Labs  Lab 11/23/20 0528 11/24/20 0435 11/25/20 0510 11/26/20 0520  WBC 11.5* 9.6 9.8 9.9     Liver Function Tests: Recent Labs  Lab 11/21/20 0511 11/23/20 0528 11/24/20 0435 11/25/20 0510 11/26/20 0520  AST 73* 217* 221* 132* 64*  ALT 139* 366* 454* 374* 254*  ALKPHOS 149* 309* 366* 354* 338*  BILITOT 0.4 0.5 0.6 0.7 0.9  PROT 5.1* 5.4* 5.5* 6.2* 6.3*  ALBUMIN 1.7* 1.9* 1.8* 2.0* 2.1*    No results for input(s): LIPASE, AMYLASE in the last 168 hours. No results for input(s): AMMONIA in the last 168 hours.  ABG    Component Value Date/Time   PHART 7.28 (L) 11/25/2020 0500   PCO2ART 72 (HH) 11/25/2020 0500   PO2ART 69 (L) 11/25/2020 0500   HCO3 33.8 (H) 11/25/2020 0500   ACIDBASEDEF 2.3 (H) 11/16/2020 0441   O2SAT 91.1 11/25/2020 0500      Coagulation Profile: No results for input(s): INR, PROTIME in the last 168 hours.  Cardiac Enzymes: Recent Labs  Lab 11/23/20 0528  CKTOTAL 19*     HbA1C: Hgb A1c MFr Bld  Date/Time Value Ref Range Status  11/12/2020 05:37 AM 5.9 (H) 4.8 - 5.6 % Final    Comment:    (NOTE)         Prediabetes: 5.7 - 6.4         Diabetes: >6.4         Glycemic control for adults with diabetes: <7.0     CBG: Recent Labs  Lab 11/25/20 1538 11/25/20 1926 11/25/20 2318 11/26/20 0345 11/26/20 0715  GLUCAP 149* 204* 176* 139* 175*     Allergies No Known Allergies   Critical care provider statement:    Critical care time (minutes):  33   Critical  care time was exclusive of:  Separately billable procedures and  treating other patients   Critical care was necessary to treat or prevent imminent or  life-threatening deterioration of the following conditions:  AIDS, ARDS, JPJ pna, CMV pneumnia, severe ARDS   Critical care was time spent personally by me on the following  activities:  Development of treatment plan with patient or surrogate,  discussions with consultants, evaluation of patient's response to  treatment, examination of patient, obtaining history from patient or  surrogate, ordering and performing treatments and interventions, ordering  and review of laboratory studies and re-evaluation of patient's condition   I assumed direction of critical care for this patient from another  provider in my specialty: no     *This note was dictated using voice recognition software/Dragon.  Despite best efforts to proofread, errors can occur which can change the meaning.  Any change was purely unintentional.    Ottie Glazier, M.D.  Pulmonary & [place]

## 2020-11-26 NOTE — Progress Notes (Signed)
   11/26/20 1330  Clinical Encounter Type  Visited With Patient and family together  Visit Type Spiritual support;Social support;Critical Care;Initial  Referral From Nurse  Consult/Referral To Chaplain  Spiritual Encounters  Spiritual Needs Prayer;Emotional  Stress Factors  Family Stress Factors Health changes;Loss of control;Major life changes   Chaplain Kirstie Larsen visited with PT and sister who was at bedside. Chaplain gave space for sister to share her origin of faith and to express her feelings. Chaplain prayed with PT's sister in the room, and other sister by way of facetime. PT's sister was in great distress and crying at the bedside. She was genuinely concerned with her brother's help. Chaplain will follow up again tomorrow.

## 2020-11-26 NOTE — Progress Notes (Signed)
Updated pts sister Cheri Kearns via telephone utilizing Spanish Interpretor all questions were answered.  Ms. Clarene Reamer was appreciative of update will continue to monitor and assess pt.   Camie Patience, AGNP  Pulmonary/Critical Care Pager [phone] (please enter 7 digits) PCCM Consult Pager [phone] (please enter 7 digits)

## 2020-11-26 NOTE — Progress Notes (Signed)
Pts with worsening hypoxia O2 sats decreasing to the 50's during shift while nursing staff turned pt to clean following a bowel movement.  Therefore, increased PEEP from 10 to 15 and administered iv lasix 40 mg x1 dose.  Following interventions pts O2 sats 79% to 81%.  Awaiting for Spanish Interpretor to arrive to notify pts sister Cheri Kearns via telephone regarding decline in pts condition.  Will continue to monitor and assess pt.  Camie Patience, AGNP  Pulmonary/Critical Care Pager [phone] (please enter 7 digits) PCCM Consult Pager [phone] (please enter 7 digits)

## 2020-11-26 NOTE — Progress Notes (Signed)
Date of Admission:  10/31/2020      ID: Jordan Gibson is a 40 y.o. male  Active Problems:   CAP (community acquired pneumonia)   Sepsis ([place])   Acute respiratory failure with hypoxia ([place])   Hyponatremia   Transaminitis   HIV positive ([place])   AIDS (acquired immune deficiency syndrome) ([place])   CMV (cytomegalovirus infection) ([place])   Syphilis   PCP (pneumocystis jiroveci pneumonia) ([place])   Acute respiratory distress syndrome (ARDS) ([facility])    Subjective: Remains intubated  Medications:   atovaquone  1,500 mg Per Tube BID   chlorhexidine gluconate (MEDLINE KIT)  15 mL Mouth Rinse BID   Chlorhexidine Gluconate Cloth  6 each Topical q morning   enoxaparin (LOVENOX) injection  40 mg Subcutaneous Q24H   feeding supplement (PROSource TF)  45 mL Per Tube Daily   free water  20 mL Per Tube Q4H   furosemide       insulin aspart  0-20 Units Subcutaneous Q4H   insulin aspart  3 Units Subcutaneous Q4H   insulin glargine  7 Units Subcutaneous QHS   mouth rinse  15 mL Mouth Rinse 10 times per day   methylPREDNISolone (SOLU-MEDROL) injection  40 mg Intravenous Q12H   pantoprazole (PROTONIX) IV  40 mg Intravenous Daily   penicillin g benzathine (BICILLIN-LA) IM  2.4 Million Units Intramuscular Weekly   primaquine  30 mg Per Tube Daily   QUEtiapine  25 mg Per Tube QHS    Objective: Vital signs in last 24 hours: Temp:  [98.78 F (37.1 C)-102.02 F (38.9 C)] 100.58 F (38.1 C) (06/15 1515) Pulse Rate:  [91-127] 101 (06/15 1515) Resp:  [13-35] 35 (06/15 1515) BP: (89-158)/(49-98) 95/61 (06/15 1500) SpO2:  [51 %-97 %] 91 % (06/15 1515) Arterial Line BP: (63-179)/(31-79) 101/56 (06/15 1515) FiO2 (%):  [100 %] 100 % (06/15 1155)  PHYSICAL EXAM:  General: Intubated , sedated- 80% oxygen Neck: Supple, symmetrical, no adenopathy, thyroid: non tender no carotid bruit and no JVD. Back: No CVA tenderness. Lungs: b/l crepts Heart: tachycardia Abdomen: Soft,  Extremities:  atraumatic, no cyanosis. No edema. No clubbing Skin: No rashes or lesions. Or bruising Lymph: Cervical, supraclavicular normal. Neurologic: cannot assess  Lab Results Recent Labs    11/25/20 0510 11/26/20 0520  WBC 9.8 9.9  HGB 9.9* 10.6*  HCT 32.0* 34.1*  NA 134* 136  K 4.5 4.2  CL 92* 87*  CO2 34* 38*  BUN 17 17  CREATININE 0.72 0.55*   Liver Panel Recent Labs    11/25/20 0510 11/26/20 0520  PROT 6.2* 6.3*  ALBUMIN 2.0* 2.1*  AST 132* 64*  ALT 374* 254*  ALKPHOS 354* 338*  BILITOT 0.7 0.9   Sedimentation Rate No results for input(s): ESRSEDRATE in the last 72 hours. C-Reactive Protein No results for input(s): CRP in the last 72 hours.  Microbiology:  Studies/Results: DG Chest Port 1 View  Result Date: 11/25/2020 CLINICAL DATA:  Acute respiratory failure EXAM: PORTABLE CHEST 1 VIEW COMPARISON:  Portable exam 0430 hours compared to 11/24/2020 FINDINGS: Tip of endotracheal tube projects 2.8 cm above carina. Nasogastric tube extends into stomach. LEFT jugular line with tip projecting over SVC. Stable heart size mediastinal contours. Diffuse BILATERAL airspace infiltrates unchanged. No pleural effusion or pneumothorax. Osseous structures unremarkable. IMPRESSION: No change in severe diffuse BILATERAL pulmonary infiltrates. Electronically Signed   By: Lavonia Dana M.D.   On: 11/25/2020 07:42     Assessment/Plan:  Acute hypoxic resp failure due  to PJP and CMV pneumonitis along with ARDS. PJP pneumonia- on Clinda, atovaquone and primaquine ( instead of bactrim- due to mixed pattern)  Difficulty in ventilating the patient. Currently on clinda, primaquine atovaquone and ganciclovir.   AIDS: Newly diagnosed.  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  Concern for drug allergy.  so Bactrim was D/C on 6/13  and it has improved   Toxoplasma IgG highly positive indicating reactivation.  Clinically did not have any CNS toxo.  Bactrim can provide primary prophylaxis. So is atovaquone.  RPR 1 is to 4 with positive tPA.  Got a dose of benzathine penicillin.  Will need 2 more doses at weekly interval.  Epstein-Barr virus DNA of 4400 is not significant.  Patient is critically ill with guarded prognosis Discussed the management with the sister at bedside Discussed the management with the intensivist.

## 2020-11-26 NOTE — Progress Notes (Signed)
Palliative: Mr. Charlette Caffey lying quietly in bed intubated/ventilated and sedated.  He appears acutely ill and very frail.  He is on 100% FiO2 with saturations in the low to mid 90s.  His sister, Alyssa Grove is at bedside.  We talked about his acute health concerns including, but not limited to, ventilator support, fever, cooling blanket.  Gardiner Ramus tells me that she has no concerns or questions at this time.  She is tearful during our visit.  Conference with bedside nursing staff related to patient condition, needs, goals of care. Goals are set, PMT to continue to shadow.  Plan: Continue full scope/full code.  Anticipate in-hospital death.  15 minutes Lillia Carmel, NP Palliative medicine team Team phone [phone] Greater than 50% of this time was spent counseling and coordinating care related to the above assessment and plan.

## 2020-11-26 NOTE — TOC Progression Note (Signed)
Transition of Care [facility]) - Progression Note    Patient Details  Name: Jordan Gibson MRN: [id number] Date of Birth: 04-Jun-1981  Transition of Care [facility]) CM/SW Contact  Marina Goodell Phone Number: [phone] 11/26/2020, 12:16 PM  Clinical Narrative:     Patient remains critically ill, sedated and intubated w/ vent 100%. Chaplain was requested and spoke with patient's sister Liliam who is at bedside.  Patient's prognosis is poor.       Expected Discharge Plan and Services                                                 Social Determinants of Health (SDOH) Interventions    Readmission Risk Interventions No flowsheet data found.

## 2020-11-26 NOTE — Progress Notes (Signed)
  Chaplain On-Call received call from [employer] with the report of the patient's sister at the bedside and appearing to need support.  Chaplain met patient's sister Liliam; the patient is non-responsive.  RN Chloe activated the Film/video editor. Chaplain and sister were assisted by Spanish Cytogeneticist.  Chaplain provided spiritual and emotional support and prayer with Liliam and other family members who were listening on her telephone.  Chaplains remain available for additional support as needed.  Chaplain Pollyann Samples M.Div., [facility]

## 2020-11-27 DIAGNOSIS — B258 Other cytomegaloviral diseases: Secondary | ICD-10-CM

## 2020-11-27 LAB — COMPREHENSIVE METABOLIC PANEL
ALT: 188 U/L — ABNORMAL HIGH (ref 0–44)
AST: 60 U/L — ABNORMAL HIGH (ref 15–41)
Albumin: 2 g/dL — ABNORMAL LOW (ref 3.5–5.0)
Alkaline Phosphatase: 460 U/L — ABNORMAL HIGH (ref 38–126)
Anion gap: 8 (ref 5–15)
BUN: 23 mg/dL — ABNORMAL HIGH (ref 6–20)
CO2: 40 mmol/L — ABNORMAL HIGH (ref 22–32)
Calcium: 8.6 mg/dL — ABNORMAL LOW (ref 8.9–10.3)
Chloride: 89 mmol/L — ABNORMAL LOW (ref 98–111)
Creatinine, Ser: 0.44 mg/dL — ABNORMAL LOW (ref 0.61–1.24)
GFR, Estimated: >60 mL/min (ref >60)
Glucose, Bld: 103 mg/dL — ABNORMAL HIGH (ref 70–99)
Potassium: 4.6 mmol/L (ref 3.5–5.1)
Sodium: 137 mmol/L (ref 135–145)
Total Bilirubin: 1.2 mg/dL (ref 0.3–1.2)
Total Protein: 6.3 g/dL — ABNORMAL LOW (ref 6.5–8.1)

## 2020-11-27 LAB — GLUCOSE, CAPILLARY
Glucose-Capillary: 102 mg/dL — ABNORMAL HIGH (ref 70–99)
Glucose-Capillary: 108 mg/dL — ABNORMAL HIGH (ref 70–99)
Glucose-Capillary: 137 mg/dL — ABNORMAL HIGH (ref 70–99)
Glucose-Capillary: 159 mg/dL — ABNORMAL HIGH (ref 70–99)
Glucose-Capillary: 235 mg/dL — ABNORMAL HIGH (ref 70–99)
Glucose-Capillary: 93 mg/dL (ref 70–99)
Glucose-Capillary: 99 mg/dL (ref 70–99)

## 2020-11-27 LAB — CBC WITH DIFFERENTIAL/PLATELET
Abs Immature Granulocytes: 0.04 10*3/uL (ref 0.00–0.07)
Basophils Absolute: 0 10*3/uL (ref 0.0–0.1)
Basophils Relative: 0 %
Eosinophils Absolute: 0 10*3/uL (ref 0.0–0.5)
Eosinophils Relative: 0 %
HCT: 31.2 % — ABNORMAL LOW (ref 39.0–52.0)
Hemoglobin: 9.9 g/dL — ABNORMAL LOW (ref 13.0–17.0)
Immature Granulocytes: 1 %
Lymphocytes Relative: 7 %
Lymphs Abs: 0.5 10*3/uL — ABNORMAL LOW (ref 0.7–4.0)
MCH: 28.1 pg (ref 26.0–34.0)
MCHC: 31.7 g/dL (ref 30.0–36.0)
MCV: 88.6 fL (ref 80.0–100.0)
Monocytes Absolute: 0.4 10*3/uL (ref 0.1–1.0)
Monocytes Relative: 5 %
Neutro Abs: 7 10*3/uL (ref 1.7–7.7)
Neutrophils Relative %: 87 %
Platelets: 187 10*3/uL (ref 150–400)
RBC: 3.52 MIL/uL — ABNORMAL LOW (ref 4.22–5.81)
RDW: 15.9 % — ABNORMAL HIGH (ref 11.5–15.5)
WBC: 8 10*3/uL (ref 4.0–10.5)
nRBC: 0 % (ref 0.0–0.2)

## 2020-11-27 LAB — GENOSURE PRIME (GSPRIL)

## 2020-11-27 LAB — MAGNESIUM: Magnesium: 2.1 mg/dL (ref 1.7–2.4)

## 2020-11-27 LAB — PHOSPHORUS: Phosphorus: 3.8 mg/dL (ref 2.5–4.6)

## 2020-11-27 MED ORDER — AZITHROMYCIN 600 MG PO TABS
1200.0000 mg | ORAL_TABLET | ORAL | Status: DC
  Filled 2020-11-27: qty 2

## 2020-11-27 MED ORDER — VITAL 1.5 CAL PO LIQD
1000.0000 mL | ORAL | Status: DC
  Administered 2020-11-27: 40 mL/h
  Administered 2020-11-28 – 2020-12-01 (×4): 1000 mL

## 2020-11-27 MED ORDER — AZITHROMYCIN 200 MG/5ML PO SUSR
1200.0000 mg | ORAL | Status: DC
  Administered 2020-11-27: 1200 mg
  Filled 2020-11-27: qty 30

## 2020-11-27 NOTE — Progress Notes (Signed)
Date of Admission:  11/08/2020    ID: Jordan Gibson is a 40 y.o. 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[place])   Acute respiratory distress syndrome (ARDS) ([facility])    Subjective:   Medications:   atovaquone  1,500 mg Per Tube BID   azithromycin  1,200 mg Per Tube Weekly   chlorhexidine gluconate (MEDLINE KIT)  15 mL Mouth Rinse BID   Chlorhexidine Gluconate Cloth  6 each Topical q morning   dolutegravir  50 mg Per Tube Daily   emtricitabine-tenofovir AF  1 tablet Per Tube Daily   enoxaparin (LOVENOX) injection  40 mg Subcutaneous Q24H   feeding supplement (PROSource TF)  45 mL Per Tube Daily   free water  20 mL Per Tube Q4H   insulin aspart  0-20 Units Subcutaneous Q4H   insulin aspart  3 Units Subcutaneous Q4H   insulin glargine  7 Units Subcutaneous QHS   mouth rinse  15 mL Mouth Rinse 10 times per day   methylPREDNISolone (SOLU-MEDROL) injection  40 mg Intravenous Q12H   pantoprazole (PROTONIX) IV  40 mg Intravenous Daily   penicillin g benzathine (BICILLIN-LA) IM  2.4 Million Units Intramuscular Weekly   primaquine  30 mg Per Tube Daily   QUEtiapine  25 mg Per Tube QHS    Objective: Vital signs in last 24 hours: BP 101/70   Pulse (!) 108   Temp 100.04 F (37.8 C) (Esophageal)   Resp (!) 35   Ht 5' 7.01" (1.702 m)   Wt 72.9 kg   SpO2 93%   BMI 25.17 kg/m    LDA: rt radial arterial line-6/13 Urethral catheter 6/4>> CVC-left IJ 6/4>>  PHYSICAL EXAM:  General: intubated.sedated and on ketamine Lungs: b/l air entry- crepts bases Heart: Tachycardia NG tube Abdomen: Soft, non-tender,not distended. Bowel sounds normal. No masses foley Extremities: atraumatic, no cyanosis. No  edema. No clubbing Skin: No rashes or lesions. Or bruising Lymph: Cervical, supraclavicular normal. Neurologic: cannot assess  Lab Results Recent Labs    11/26/20 0520 11/27/20 0441  WBC 9.9 8.0  HGB 10.6* 9.9*  HCT 34.1* 31.2*  NA 136 137  K 4.2 4.6  CL 87* 89*  CO2 38* 40*  BUN 17 23*  CREATININE 0.55* 0.44*   Liver Panel Recent Labs    11/26/20 0520 11/27/20 0441  PROT 6.3* 6.3*  ALBUMIN 2.1* 2.0*  AST 64* 60*  ALT 254* 188*  ALKPHOS 338* 460*  BILITOT 0.9 1.2     Microbiology: Infectious disease work-up BC- NG Fungal antibody-Neg Histoplasma antigen neg Toxo IgG 286 Cryptococcus antigen neg HIV RNA 1.5 million copies PCP PCR  POSITIVE in BAL Beta D glucan > 500 RPR 1:4 TPA positive EBV PCR 4400- Not significant AFB blood culture sent on 11/19/20 AFB BAL 11/14/20 negative  CMV > 1 million   Assessment/Plan: Acute hypoxic resp failure due to PJP and CMV pneumonitis along with ARDS. PJP pneumonia- on Clinda, atovaquone and primaquine ( instead of bactrim- due to mixed pattern)   Difficulty in ventilating the patient. Currently on clinda, primaquine atovaquone and ganciclovir.   AIDS: Newly diagnosed.  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  Concern for drug allergy.  so Bactrim was D/C on 6/13  and it has improved. Alkpo4 up- r/o acalculous cholecysitis   Toxoplasma IgG highly positive indicating reactivation.  Clinically did not have any CNS toxo.  Bactrim can provide primary prophylaxis. So is atovaquone.  RPR 1 is to 4 with positive tPA.  Got a dose of benzathine penicillin.  Will need 2 more doses at weekly interval.  Epstein-Barr virus DNA of 4400 is not  significant.  Patient is critically ill with guarded prognosis Discussed the management with the intensivist.

## 2020-11-27 NOTE — Progress Notes (Signed)
NAME:  Jordan Gibson, MRN:  [id number], DOB:  July 17, 1980, LOS: 21 ADMISSION DATE:  11/05/2020  Brief History of Present Illness   Dervin Vore is a 40 y.o. Male with no significant past medical history who presented to [facility] ED on 11/07/20 with a 1 week history of shortness of breath, nonproductive cough, and fever. He was admitted to Acute Hypoxic Respiratory Failure and Sepsis in the setting of suspected Community Acquired Pneumonia.    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 Not on HAART. CD4 count 9, viral load 1.5 million. 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 Also tested positive for Rhinovirus/enterovirus, Syphilis, and Toxoplasma now complicated by ARDS requiring mechanical ventilation support.   Pertinent  Medical History  No significant past medical history   Significant Hospital Events: Including procedures, antibiotic start and stop dates in addition to other pertinent events  11/03/2020: Presented to ED, to be admitted to stepdown unit by hospitalist 11/07/2020: Increasing FiO2 requirements and worsening respiratory status, PCCM consulted due to high risk for deterioration and need for intubation. ABX broadened to Cefepime and Vancomycin, along with Azithromycin 11/08/20- patient with respiratory failure s/p ETT overnight. Vancomycin dcd today MRSA pcr negative. 11/09/20- Pt extubated to HFNC _0  AND 45%.  Patient with severe ARDS. I met with family and reviewed care plan. +Rhino/ENtero virus 11/10/20- patient improved, has been extubated to HFNC.  Tranferring to step down unit, signed out to [facility]. 11/12/20- FiO2 increased to 75%, dcd IV NS at 50/h, have aded lasix 20 bid, decreased prednisone to 40 once daily, stopped po vicodin.  Metane[facility] ordered BID with RT for recruitment.  Repeat CXR today 11/13/20: Transfferd back to ICU  Due to severe acute hypoxic respiratory failure 11/14/20: Failed HHFN and BiPAP requiring emergent re- intubation. s/p bedside Bronchoscopy 11/15/20: Patient  continues to be hypoxic after increasing FiO2 from 50% to 100% & PEEP from 10 to 12. ABG post these changes revealed a PF ratio of 100 & respiratory acidosis. Proned 6/5 severe hypoxia, ARDS, AIDS, +renal failure 6/7 severe hypoxia, ALI 6/8 severe hypoxia, ARDS initiated PRONING 6/9 SEVERE HYPOXIA  6/10: Severe hypoxia, remains critically ill 6/11: Persistent issues with hypoxemia, increased sedation needs due to ventilator asynchrony 6/12: Persistent issues with ventilator asynchrony, increasing FiO2 requirements, updated sister and have made it very clear that his prognosis is exceedingly poor 11/24/20-  patient on mechanical ventilation on FiO2 100%, there was previosly recurrent dyssynchrony requiring numerous doses of paralytics to reach stable vital signs.  Complex MICU course on >8 infusions simultaneously. Reviewed in detail with RN this morning.  Very unfortunate and extremely ill young patient at high risk of death.  We considered transfer for potential ECMO - [facility] is at capacity unable to accept any patients, [facility] is at capacity unable to take any patients, [facility] was able to review case but patient not candidate.  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  Reivewed with Palliative care today.  11/25/20- patient remains on PRVC 100% severe pneumonia with opportunistic infections due to advanced AIDS.  Patient is not responding to therapy with very poor prognosis 11/26/20- patient is worse overnight, he is desaturated to 85% on Fio2 100%.  Reviewed plan with ID and will initiate AIDS therapy today. Met with sister today reviewed goals of care recommendation from me for DNR.  Family wants to keep full code.  11/27/20- Patient is on 100% FiO2, no events overnight, remains high  risk of cardiopulmonary arrest.  Cultures:  6/7: Blood culture x2>> 6/3: PJP PCR:  Positive 6/3: Toxoplasma PCR: negative, IgG  positive 6/3: Histoplasma urinary Ag: <0.5 6/3: BAL:  Normal respiratory flora 6/1: RPR: Reactive, titer 1:4, Ab (+) 6/1: Cryptococcus Ag: (-) 5/31: Hep B/C screen: (-) 5/31: Quantiferon-TB: (-) 5/31: Histoplasma Gal'man: <0.5 5/31: Fungitell: >500 5/28:Mycoplasma pneumonia ab >>negative 5/28 Tracheal aspirate: Normal respiratory flora 5/28 Respiratory panel: (+) Rhinovirus / enterovirus 5/26: SARS-CoV-2 PCR>> negative 5/26: Influenza PCR>> negative 5/26: Blood culture x2>>No growth thus far 5/26: Urine Cx>>no growth 5/27: MRSA PCR>> negative 5/27:Strep pneumo urinary antigen>>negative 5/27:Legionella urinary antigen>>negative 6/08: CMV DNA over 1 million copies   Antimicrobials:  5/26 Azithromycin >> 5/31 5/26 Cefepime >> 5/30 5/27 Vancomycin > 5/28 5/31 Bactrim >>  6/3 Penicillin G Benzathine >> 6/4 Anidulafungin >>  6/7 Zosyn>>6/9 6/10 ganciclovir>>   Antibiotics Given (last 72 hours)     Date/Time Action Medication Dose Rate   11/24/20 1116 New Bag/Given   sulfamethoxazole-trimethoprim (BACTRIM) 375 mg of trimethoprim in dextrose 5 % 500 mL IVPB 375 mg of trimethoprim 349 mL/hr   11/24/20 1418 New Bag/Given   ganciclovir (CYTOVENE) 375 mg in sodium chloride 0.9 % 100 mL IVPB 375 mg 100 mL/hr   11/24/20 1419 Given   primaquine tablet 30 mg 30 mg    11/24/20 1506 New Bag/Given   clindamycin (CLEOCIN) IVPB 900 mg 900 mg 100 mL/hr   11/24/20 1948 Given   atovaquone (MEPRON) 750 MG/5ML suspension 1,500 mg 1,500 mg    11/24/20 2119 New Bag/Given   clindamycin (CLEOCIN) IVPB 900 mg 900 mg 100 mL/hr   11/24/20 2302 New Bag/Given   ganciclovir (CYTOVENE) 375 mg in sodium chloride 0.9 % 100 mL IVPB 375 mg 100 mL/hr   11/25/20 0543 New Bag/Given   clindamycin (CLEOCIN) IVPB 900 mg 900 mg 100 mL/hr   11/25/20 0950 Given   primaquine tablet 30 mg 30 mg    11/25/20 0950 Given   atovaquone (MEPRON) 750 MG/5ML suspension 1,500 mg 1,500 mg    11/25/20 0951 Given  [Bilateral  glutes]   penicillin g benzathine (BICILLIN LA) 1200000 UNIT/2ML injection 2.4 Million Units 2.4 Million Units    11/25/20 1129 New Bag/Given   ganciclovir (CYTOVENE) 375 mg in sodium chloride 0.9 % 100 mL IVPB 375 mg 100 mL/hr   11/25/20 1339 New Bag/Given   clindamycin (CLEOCIN) IVPB 900 mg 900 mg 100 mL/hr   11/25/20 2131 Given   atovaquone (MEPRON) 750 MG/5ML suspension 1,500 mg 1,500 mg    11/25/20 2133 New Bag/Given   clindamycin (CLEOCIN) IVPB 900 mg 900 mg 100 mL/hr   11/25/20 2233 New Bag/Given   ganciclovir (CYTOVENE) 375 mg in sodium chloride 0.9 % 100 mL IVPB 375 mg 100 mL/hr   11/26/20 0527 New Bag/Given   clindamycin (CLEOCIN) IVPB 900 mg 900 mg 100 mL/hr   11/26/20 0948 Given   primaquine tablet 30 mg 30 mg    11/26/20 0949 Given   atovaquone (MEPRON) 750 MG/5ML suspension 1,500 mg 1,500 mg    11/26/20 1222 New Bag/Given  [Hung by VAS team]   ganciclovir (CYTOVENE) 375 mg in sodium chloride 0.9 % 100 mL IVPB 375 mg 100 mL/hr   11/26/20 1348 New Bag/Given   clindamycin (CLEOCIN) IVPB 900 mg 900 mg 100 mL/hr   11/26/20 2141 Given   atovaquone (MEPRON) 750 MG/5ML suspension 1,500 mg 1,500 mg    11/26/20 2142 New Bag/Given   clindamycin (CLEOCIN) IVPB 900 mg  900 mg 100 mL/hr   11/26/20 2336 New Bag/Given   ganciclovir (CYTOVENE) 375 mg in sodium chloride 0.9 % 100 mL IVPB 375 mg 100 mL/hr   11/27/20 0552 New Bag/Given   clindamycin (CLEOCIN) IVPB 900 mg 900 mg 100 mL/hr      Scheduled Meds:  atovaquone  1,500 mg Per Tube BID   chlorhexidine gluconate (MEDLINE KIT)  15 mL Mouth Rinse BID   Chlorhexidine Gluconate Cloth  6 each Topical q morning   dolutegravir  50 mg Per Tube Daily   emtricitabine-tenofovir AF  1 tablet Per Tube Daily   enoxaparin (LOVENOX) injection  40 mg Subcutaneous Q24H   feeding supplement (PROSource TF)  45 mL Per Tube Daily   free water  20 mL Per Tube Q4H   insulin aspart  0-20 Units Subcutaneous Q4H   insulin aspart  3 Units Subcutaneous  Q4H   insulin glargine  7 Units Subcutaneous QHS   mouth rinse  15 mL Mouth Rinse 10 times per day   methylPREDNISolone (SOLU-MEDROL) injection  40 mg Intravenous Q12H   pantoprazole (PROTONIX) IV  40 mg Intravenous Daily   penicillin g benzathine (BICILLIN-LA) IM  2.4 Million Units Intramuscular Weekly   primaquine  30 mg Per Tube Daily   QUEtiapine  25 mg Per Tube QHS   Continuous Infusions:  sodium chloride Stopped (11/27/20 0552)   clindamycin (CLEOCIN) IV 100 mL/hr at 11/27/20 0600   esmolol 25 mcg/kg/min (11/27/20 0634)   feeding supplement (VITAL 1.5 CAL) Stopped (11/26/20 0700)   ganciclovir Stopped (11/27/20 0036)   HYDROmorphone 4 mg/hr (11/27/20 0600)   ketamine (KETALAR) Adult IV Infusion 2 mg/kg/hr (11/27/20 0631)   midazolam 10 mg/hr (11/27/20 0600)   PRN Meds:.acetaminophen, acetaminophen, hydrALAZINE, HYDROmorphone, labetalol, midazolam, ondansetron **OR** ondansetron (ZOFRAN) IV, vecuronium  Chest x-ray today:      Objective   Blood pressure 101/70, pulse (!) 108, temperature 100.04 F (37.8 C), temperature source Esophageal, resp. rate (!) 35, height 5' 7.01" (1.702 m), weight 72.9 kg, SpO2 93 %.    Vent Mode: PRVC FiO2 (%):  [100 %] 100 % Set Rate:  [35 bmp] 35 bmp Vt Set:  [420 mL] 420 mL PEEP:  [15 cmH20] 15 cmH20 Plateau Pressure:  [36 cmH20-43 cmH20] 43 cmH20   Intake/Output Summary (Last 24 hours) at 11/27/2020 0952 Last data filed at 11/27/2020 0600 Gross per 24 hour  Intake 1885.11 ml  Output 2050 ml  Net -164.89 ml    Filed Weights   11/23/20 0500 11/25/20 0405 11/27/20 0437  Weight: 72.6 kg 72.8 kg 72.9 kg    REVIEW OF SYSTEMS  PATIENT IS UNABLE TO PROVIDE COMPLETE REVIEW OF SYSTEMS DUE TO SEVERE CRITICAL ILLNESS AND TOXIC METABOLIC ENCEPHALOPATHY   PHYSICAL EXAMINATION:  GENERAL: Critically ill-appearing male, laying in bed, intubated and sedated, asynchronous with the vent, tachypneic even on the vent. HEAD: Normocephalic,  atraumatic.  EYES: Pupils equal, round, reactive to light.  No scleral icterus.  MOUTH: Moist mucosal membrane.  ET tube in place NECK: Supple, + JVD 4+ PULMONARY: Coarse breath sounds bilaterally, no wheezing, overbreathing the vent, even CARDIOVASCULAR: S1 and S2. Regular rate and rhythm. No murmurs, rubs, or gallops.  GASTROINTESTINAL: Soft, nontender, -distended. Positive bowel sounds.  MUSCULOSKELETAL: No swelling, clubbing, or edema.  NEUROLOGIC: sedated on mv GCS4T SKIN: Warm and diaphoretic.  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 leading to severe hypoxic resp failure intubated x 2 with severe b/l ling disease and ARDS/ALI transient renal failrue now ith elevated liver tests with liver dysfunction +PJP PNEUMONIA +TOXO +SYPHILIS +RHINOVIRUS +CMV  Assessment / Plan:   Acute Hypoxic and Hypercapnic Respiratory Failure due to PJP Pneumonia Severe ARDS -Full vent support, implement lung protective strategies via ARDS Net Protocol -Reiterated ARDS protocol with VT 400 equals to 6 mL/kg, ABG to follow -Wean FiO2 and PEEP as tolerated maintain O2 sats greater than 90% -Follow intermittent chest x-ray and ABG as needed -Spontaneous breathing trials when respiratory parameters met -Continue VAP bundle -As needed bronchodilators -Continue antibiotics  Septic Shock -Continuous cardiac monitoring -Maintain MAP greater than 65 -IV fluids -Vasopressors as needed to maintain MAP goal -2D Echo 11/08/20 :LVEF 60-65%, normal RV systolic function -Persistent shock physiology bodes for very poor prognosis -Persistent requirement of vasopressors.  Severe Sepsis due to PJP Pneumonia Newly diagnosed AIDS Persistent Fever -Monitor fever curve -Trend  WBCs -Follow cultures -ID following, antibiotics as per ID -Currently on Bactrim after CMV titer back. Bactrim better for PJP/Toxo -CD4 is 7 and VL 1.5 million copies -c/w HAART after 7-10 days of PCP treatment per ID-monitor for IRIS -gancyclovir for CMV > 1 million copies  Hyponatremia Volume overload -Monitor I&O's / urinary output -Follow BMP -Ensure adequate renal perfusion -Avoid nephrotoxic agents as able -Replace electrolytes as indicated -Repeat Lasix x1today  Mild Transaminitis -Negative viral hepatitis panel -Trend LFT's  Acute toxic metabolic encephalopathy, need for sedation -Goal RASS -2 to -3 given severe ARDS -Dilaudid & Versed, and Precedex as needed to maintain RASS goal -Avoid sedating meds as able -SAT as tolerates, not stable for same due to high FiO2 requirement -Provide supportive care  Hyperglycemia -CBG's -SSI -Follow ICU Hypo/Hyperglycemia protocol   Pt is critically ill.  Prognosis is guarded. High risk for cardiac arrest and death.  Best practice (right click and "Reselect all SmartList Selections" daily)  Diet:  Tube Feed Pain/Anxiety/Delirium protocol (if indicated): Yes (RASS goal -2 to -3) VAP protocol (if indicated): Yes DVT prophylaxis: Lovenox GI prophylaxis: PPI Glucose control:  SSI Yes Central venous access:  Yes, and it is still needed Arterial line:  N/A Foley:  Yes, and it is still needed Mobility:  bed rest  PT consulted: N/A Last date of multidisciplinary goals of care discussion [11/23/20] we will place palliative care consultation Code Status:  full code Disposition: ICU    Labs   CBC: Recent Labs  Lab 11/23/20 0528 11/24/20 0435 11/25/20 0510 11/26/20 0520 11/27/20 0441  WBC 11.5* 9.6 9.8 9.9 8.0  NEUTROABS 10.5* 8.8* 8.9* 8.6* 7.0  HGB 9.7* 9.4* 9.9* 10.6* 9.9*  HCT 30.7* 30.3* 32.0* 34.1* 31.2*  MCV 87.2 89.4 88.6 88.8 88.6  PLT 183 167 175 198 187     Basic Metabolic Panel: Recent Labs  Lab  11/23/20 0528 11/24/20 0435 11/25/20 0510 11/26/20 0520 11/27/20 0441  NA 131* 129* 134* 136 137  K 4.7 5.1 4.5 4.2 4.6  CL 94* 91* 92* 87* 89*  CO2 32 34* 34* 38* 40*  GLUCOSE 179* 261* 205* 157* 103*  BUN _0 23*  CREATININE 0.56* 0.58* 0.72 0.55* 0.44*  CALCIUM 8.2* 7.9* 8.7* 8.5* 8.6*  MG 2.0 2.1 2.2 2.0 2.1  PHOS 4.1 3.6 3.9 3.6 3.8    GFR: Estimated Creatinine Clearance: 113.6 mL/min (A) (by C-G formula based  on SCr of 0.44 mg/dL (L)). Recent Labs  Lab 11/24/20 0435 11/25/20 0510 11/26/20 0520 11/27/20 0441  WBC 9.6 9.8 9.9 8.0     Liver Function Tests: Recent Labs  Lab 11/23/20 0528 11/24/20 0435 11/25/20 0510 11/26/20 0520 11/27/20 0441  AST 217* 221* 132* 64* 60*  ALT 366* 454* 374* 254* 188*  ALKPHOS 309* 366* 354* 338* 460*  BILITOT 0.5 0.6 0.7 0.9 1.2  PROT 5.4* 5.5* 6.2* 6.3* 6.3*  ALBUMIN 1.9* 1.8* 2.0* 2.1* 2.0*    No results for input(s): LIPASE, AMYLASE in the last 168 hours. No results for input(s): AMMONIA in the last 168 hours.  ABG    Component Value Date/Time   PHART 7.28 (L) 11/25/2020 0500   PCO2ART 72 (HH) 11/25/2020 0500   PO2ART 69 (L) 11/25/2020 0500   HCO3 33.8 (H) 11/25/2020 0500   ACIDBASEDEF 2.3 (H) 11/16/2020 0441   O2SAT 91.1 11/25/2020 0500      Coagulation Profile: No results for input(s): INR, PROTIME in the last 168 hours.  Cardiac Enzymes: Recent Labs  Lab 11/23/20 0528  CKTOTAL 19*     HbA1C: Hgb A1c MFr Bld  Date/Time Value Ref Range Status  11/12/2020 05:37 AM 5.9 (H) 4.8 - 5.6 % Final    Comment:    (NOTE)         Prediabetes: 5.7 - 6.4         Diabetes: >6.4         Glycemic control for adults with diabetes: <7.0     CBG: Recent Labs  Lab 11/26/20 1516 11/26/20 2004 11/27/20 0000 11/27/20 0337 11/27/20 0714  GLUCAP 94 81 93 108* 102*     Allergies No Known Allergies   Critical care provider statement:    Critical care time (minutes):  33   Critical care time was  exclusive of:  Separately billable procedures and  treating other patients   Critical care was necessary to treat or prevent imminent or  life-threatening deterioration of the following conditions:  AIDS, ARDS, JPJ pna, CMV pneumnia, severe ARDS   Critical care was time spent personally by me on the following  activities:  Development of treatment plan with patient or surrogate,  discussions with consultants, evaluation of patient's response to  treatment, examination of patient, obtaining history from patient or  surrogate, ordering and performing treatments and interventions, ordering  and review of laboratory studies and re-evaluation of patient's condition   I assumed direction of critical care for this patient from another  provider in my specialty: no     *This note was dictated using voice recognition software/Dragon.  Despite best efforts to proofread, errors can occur which can change the meaning.  Any change was purely unintentional.    Ottie Glazier, M.D.  [facility] [place]

## 2020-11-27 NOTE — Progress Notes (Addendum)
Palliative: Unfortunately, Jordan Gibson remains intubated/ventilated with FiO2 100%.  He is critically ill, and an in-hospital death would not be surprising.  No family at bedside at this time.  Multiple conversations with healthcare surrogate/sister, Jordan Gibson with CCM attending and palliative team.  At this point, goals are set for full scope/full code.  Conference with CCM attending and bedside nursing staff. PMT shadowing.  Plan: Full scope/full code.  In hospital death would not be surprising.  25 minutes Jordan Carmel, NP Palliative medicine team Team phone [phone] Greater than 50% of this time was spent counseling and coordinating care related to the above assessment and plan.

## 2020-11-27 NOTE — Progress Notes (Signed)
Nutrition Follow-up  DOCUMENTATION CODES:   Not applicable  INTERVENTION:   Vital 1.5 @ 41m/hr- Initiate at 462mhr and increase by 107mr q 8 hours until goal rate is reached.   Continue ProSource 59m2mily via tube   Free water flushes 20ml67mhours to maintain tube patency   Regimen provides 2560kcal/day, 125g/day protein and 1403ml/71mfree water   NUTRITION DIAGNOSIS:   Inadequate oral intake related to  (pt sedated and ventilated) as evidenced by NPO status.  GOAL:   Provide needs based on ASPEN/SCCM guidelines -met with tube feeds   MONITOR:   Vent status, Labs, Weight trends, TF tolerance, Skin, I & O's  ASSESSMENT:   40 y.o26Male with no significant past medical history who [facility] with a 1 week history of shortness of breath, nonproductive cough and fever. Pt admitted with acute hypoxic respiratory failure and sepsis in the setting of suspected community acquired pneumonia. Pt also positive for HIV   Pt remains sedated and ventilated. Tube feeds held yesterday r/t low pressures and increased oxygen requirements. Plan is to resume tube feeds today; plan to start at low rate per MD. Hyponatremia resolved. Pt +8.9L on his I & O's; will provide minimum free water flushes. Refeed labs stable. Per chart, pt is down 10lbs(6%) since admit but appears to weight stable over the past 10 days. Palliative care following; family wants full scope.   Medications reviewed and include: lovenox, insulin, solu-medrol, protonix, penicillin, clindamycin, hydromorphone   Labs reviewed: Na 137 wnl, K 4.6 wnl, creat 0.44(L), P 3.8 wnl, Mg 2.1 wnl, alb 2.0(L) Triglycerides 455(H)- 6/15 Hgb 9.9(L), Hct 31.2(L) cbgs- 93, 108, 102, 99 x 24 hrs  Patient is currently intubated on ventilator support MV: 14.5 L/min Temp (24hrs), Avg:100.8 F (38.2 C), Min:99.86 F (37.7 C), Max:102.02 F (38.9 C)  Propofol: none   MAP- >65mmHg62mOP- 2050ml   36m Order:    Diet Order             Diet NPO time specified  Diet effective now                  EDUCATION NEEDS:   No education needs have been identified at this time  Skin:  Skin Assessment: Reviewed RN Assessment  Last BM:  6/15- type 7  Height:   Ht Readings from Last 1 Encounters:  11/25/20 5' 7.01" (1.702 m)    Weight:   Wt Readings from Last 1 Encounters:  11/27/20 72.9 kg   BMI:  Body mass index is 25.17 kg/m.  Estimated Nutritional Needs:   Kcal:  2200-2500kcal/day  Protein:  110-125g/day  Fluid:  2.1-2.4L/day  Shunda Rabadi CaKoleen Distance LDN Please refer to [facility]/or RD on-call/weekend/after hours pager

## 2020-11-28 LAB — CBC WITH DIFFERENTIAL/PLATELET
Abs Immature Granulocytes: 0.02 10*3/uL (ref 0.00–0.07)
Basophils Absolute: 0 10*3/uL (ref 0.0–0.1)
Basophils Relative: 0 %
Eosinophils Absolute: 0 10*3/uL (ref 0.0–0.5)
Eosinophils Relative: 0 %
HCT: 29 % — ABNORMAL LOW (ref 39.0–52.0)
Hemoglobin: 8.6 g/dL — ABNORMAL LOW (ref 13.0–17.0)
Immature Granulocytes: 0 %
Lymphocytes Relative: 7 %
Lymphs Abs: 0.4 10*3/uL — ABNORMAL LOW (ref 0.7–4.0)
MCH: 27.6 pg (ref 26.0–34.0)
MCHC: 29.7 g/dL — ABNORMAL LOW (ref 30.0–36.0)
MCV: 92.9 fL (ref 80.0–100.0)
Monocytes Absolute: 0.3 10*3/uL (ref 0.1–1.0)
Monocytes Relative: 6 %
Neutro Abs: 4.3 10*3/uL (ref 1.7–7.7)
Neutrophils Relative %: 87 %
Platelets: 196 10*3/uL (ref 150–400)
RBC: 3.12 MIL/uL — ABNORMAL LOW (ref 4.22–5.81)
RDW: 16 % — ABNORMAL HIGH (ref 11.5–15.5)
WBC: 5 10*3/uL (ref 4.0–10.5)
nRBC: 0 % (ref 0.0–0.2)

## 2020-11-28 LAB — GLUCOSE, CAPILLARY
Glucose-Capillary: 135 mg/dL — ABNORMAL HIGH (ref 70–99)
Glucose-Capillary: 108 mg/dL — ABNORMAL HIGH (ref 70–99)
Glucose-Capillary: 117 mg/dL — ABNORMAL HIGH (ref 70–99)
Glucose-Capillary: 131 mg/dL — ABNORMAL HIGH (ref 70–99)
Glucose-Capillary: 130 mg/dL — ABNORMAL HIGH (ref 70–99)

## 2020-11-28 LAB — COMPREHENSIVE METABOLIC PANEL
ALT: 130 U/L — ABNORMAL HIGH (ref 0–44)
AST: 44 U/L — ABNORMAL HIGH (ref 15–41)
Albumin: 1.9 g/dL — ABNORMAL LOW (ref 3.5–5.0)
Alkaline Phosphatase: 390 U/L — ABNORMAL HIGH (ref 38–126)
Anion gap: 8 (ref 5–15)
BUN: 24 mg/dL — ABNORMAL HIGH (ref 6–20)
CO2: 39 mmol/L — ABNORMAL HIGH (ref 22–32)
Calcium: 8.5 mg/dL — ABNORMAL LOW (ref 8.9–10.3)
Chloride: 93 mmol/L — ABNORMAL LOW (ref 98–111)
Creatinine, Ser: 0.45 mg/dL — ABNORMAL LOW (ref 0.61–1.24)
GFR, Estimated: >60 mL/min (ref >60)
Glucose, Bld: 151 mg/dL — ABNORMAL HIGH (ref 70–99)
Potassium: 4.4 mmol/L (ref 3.5–5.1)
Sodium: 140 mmol/L (ref 135–145)
Total Bilirubin: 0.9 mg/dL (ref 0.3–1.2)
Total Protein: 5.8 g/dL — ABNORMAL LOW (ref 6.5–8.1)

## 2020-11-28 LAB — MAGNESIUM: Magnesium: 2.1 mg/dL (ref 1.7–2.4)

## 2020-11-28 LAB — PHOSPHORUS: Phosphorus: 3.1 mg/dL (ref 2.5–4.6)

## 2020-11-28 MED ORDER — STERILE WATER FOR INJECTION IJ SOLN
INTRAMUSCULAR | Status: AC
  Filled 2020-11-28: qty 10

## 2020-11-28 MED ORDER — METHYLPREDNISOLONE SODIUM SUCC 40 MG IJ SOLR
40.0000 mg | INTRAMUSCULAR | Status: DC
  Administered 2020-11-29 – 2020-12-02 (×4): 40 mg via INTRAVENOUS
  Filled 2020-11-28 (×4): qty 1

## 2020-11-28 MED ORDER — METOPROLOL TARTRATE 5 MG/5ML IV SOLN
2.5000 mg | Freq: Four times a day (QID) | INTRAVENOUS | Status: DC | PRN
  Administered 2020-11-28: 5 mg via INTRAVENOUS
  Administered 2020-11-28 – 2020-12-02 (×3): 2.5 mg via INTRAVENOUS
  Filled 2020-11-28 (×4): qty 5

## 2020-11-28 MED ORDER — MIDAZOLAM BOLUS VIA INFUSION
4.0000 mg | Freq: Once | INTRAVENOUS | Status: AC
  Administered 2020-11-28: 4 mg via INTRAVENOUS
  Filled 2020-11-28: qty 4

## 2020-11-28 MED ORDER — FUROSEMIDE 10 MG/ML IJ SOLN
40.0000 mg | Freq: Once | INTRAMUSCULAR | Status: AC
  Administered 2020-11-28: 40 mg via INTRAVENOUS
  Filled 2020-11-28: qty 4

## 2020-11-28 MED ORDER — FUROSEMIDE 10 MG/ML IJ SOLN
20.0000 mg | Freq: Once | INTRAMUSCULAR | Status: AC
  Administered 2020-11-28: 20 mg via INTRAVENOUS
  Filled 2020-11-28: qty 2

## 2020-11-28 NOTE — Progress Notes (Addendum)
Date of Admission:  11/05/2020      ID: Jordan Gibson is a 40 y.o. 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[place])   Acute respiratory distress syndrome (ARDS) ([facility])    Subjective: Remains intubated No fever in 24 hrs  Medications:   atovaquone  1,500 mg Per Tube BID   azithromycin  1,200 mg Per Tube Weekly   chlorhexidine gluconate (MEDLINE KIT)  15 mL Mouth Rinse BID   Chlorhexidine Gluconate Cloth  6 each Topical q morning   dolutegravir  50 mg Per Tube Daily   emtricitabine-tenofovir AF  1 tablet Per Tube Daily   enoxaparin (LOVENOX) injection  40 mg Subcutaneous Q24H   feeding supplement (PROSource TF)  45 mL Per Tube Daily   free water  20 mL Per Tube Q4H   insulin aspart  0-20 Units Subcutaneous Q4H   insulin aspart  3 Units Subcutaneous Q4H   insulin glargine  7 Units Subcutaneous QHS   mouth rinse  15 mL Mouth Rinse 10 times per day   methylPREDNISolone (SOLU-MEDROL) injection  40 mg Intravenous Q12H   pantoprazole (PROTONIX) IV  40 mg Intravenous Daily   penicillin g benzathine (BICILLIN-LA) IM  2.4 Million Units Intramuscular Weekly   primaquine  30 mg Per Tube Daily   QUEtiapine  25 mg Per Tube QHS    Objective: Vital signs in last 24 hours: Patient Vitals for the past 24 hrs:  BP Temp Temp src Pulse Resp SpO2 Weight  11/28/20 1406 -- -- -- -- -- 94 % --  11/28/20 1300 134/76 (!) 97.52 F (36.4 C) -- 100 (!) 28 95 % --  11/28/20 1230 -- (!) 97.52 F (36.4 C) -- (!) 105 (!) 25 96 % --  11/28/20 1200 123/67 (!) 97.52 F (36.4 C) -- (!) 107 (!) 31 97 % --  11/28/20 1130 -- (!) 97.52 F (36.4 C) -- (!) 105 (!) 28 96 % --  11/28/20 1100 117/61 (!) 97.52 F (36.4 C) -- (!)  103 (!) 35 96 % --  11/28/20 1030 -- (!) 97.52 F (36.4 C) -- (!) 110 (!) 28 97 % --  11/28/20 1000 (!) 122/59 (!) 97.34 F (36.3 C) -- 99 (!) 25 97 % --  11/28/20 0930 -- (!) 97.52 F (36.4 C) -- (!) 107 20 93 % --  11/28/20 0900 114/66 97.7 F (36.5 C) -- (!) 110 (!) 25 94 % --  11/28/20 0800 136/72 97.88 F (36.6 C) Esophageal (!) 115 (!) 28 94 % --  11/28/20 0700 139/75 98.06 F (36.7 C) -- (!) 113 (!) 23 97 % --  11/28/20 0600 115/65 97.88 F (36.6 C) Esophageal (!) 114 (!) 35 97 % --  11/28/20 0500 113/60 98.06 F (36.7 C) Esophageal (!) 111 (!) 35 96 % --  11/28/20 0440 -- -- -- -- -- -- 73 kg  11/28/20 0400 111/63 98.24 F (36.8 C) Esophageal (!) 109 (!) 35 96 % --  11/28/20 0300 108/64 98.42 F (36.9 C) Esophageal (!) 111 (!) 35 96 % --  11/28/20 0200 114/66 98.6 F (37 C) Esophageal (!) 116 (!) 26 94 % --  11/28/20 0100 115/68 98.78 F (37.1 C) Esophageal (!)  123 (!) 26 94 % --  11/28/20 0000 116/66 99.14 F (37.3 C) Esophageal (!) 126 (!) 22 93 % --  11/27/20 2300 99/63 99.32 F (37.4 C) Esophageal (!) 118 (!) 30 92 % --  11/27/20 2200 109/71 99.32 F (37.4 C) Esophageal (!) 119 (!) 35 91 % --  11/27/20 2100 107/65 99.5 F (37.5 C) Esophageal (!) 121 (!) 34 94 % --  11/27/20 2000 105/66 99.32 F (37.4 C) Esophageal (!) 117 (!) 28 95 % --  11/27/20 1900 100/62 99.5 F (37.5 C) Esophageal (!) 120 (!) 32 95 % --  11/27/20 1700 103/65 99.32 F (37.4 C) -- (!) 111 (!) 28 94 % --  11/27/20 1600 114/71 98.8 F (37.1 C) Bladder (!) 106 (!) 30 90 % --  11/27/20 1550 -- -- -- -- -- 96 % --  11/27/20 1500 113/76 99 F (37.2 C) -- (!) 114 (!) 23 92 % --    Temp:  [97.34 F (36.3 C)-99.5 F (37.5 C)] 97.52 F (36.4 C) (06/17 1300) Pulse Rate:  [99-126] 100 (06/17 1300) Resp:  [20-35] 28 (06/17 1300) BP: (99-139)/(59-76) 134/76 (06/17 1300) SpO2:  [90 %-97 %] 94 % (06/17 1406) Arterial Line BP: (88-145)/(45-72) 135/56 (06/17 1300) FiO2 (%):  [100 %] 100 % (06/17  1406) Weight:  [73 kg] 73 kg (06/17 0440)  PHYSICAL EXAM:  General: intubated and sedated Off paralyzing agent  Neck: Supple, symmetrical, no adenopathy, thyroid: non tender no carotid bruit and no JVD. Back: No CVA tenderness. Lungs: b/l crepts Heart: Tachycardia Abdomen: Soft, non-tender,not distended. Bowel sounds normal. No masses Extremities: atraumatic, no cyanosis. No edema. No clubbing Skin: No rashes or lesions. Or bruising Lymph: Cervical, supraclavicular normal. Neurologic: cannot examine Foley   Lab Results Recent Labs    11/27/20 0441 11/28/20 0431  WBC 8.0 5.0  HGB 9.9* 8.6*  HCT 31.2* 29.0*  NA 137 140  K 4.6 4.4  CL 89* 93*  CO2 40* 39*  BUN 23* 24*  CREATININE 0.44* 0.45*   Liver Panel Recent Labs    11/27/20 0441 11/28/20 0431  PROT 6.3* 5.8*  ALBUMIN 2.0* 1.9*  AST 60* 44*  ALT 188* 130*  ALKPHOS 460* 390*  BILITOT 1.2 0.9   Sedimentation Rate No results for input(s): ESRSEDRATE in the last 72 hours. C-Reactive Protein No results for input(s): CRP in the last 72 hours.  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  On HAARt - descovy+ tivicay- need to weight the risk and benefit of HAART- difficult case- HAART can prevent other Ois, but can cause IRIS_ but he is on steroids which will help with  that   Fever- none in 24 hrs   Abnormal LFTs: Mixed picture of cholestasis and transaminitis.  Concern for drug allergy.  so Bactrim was D/C on 6/13  and it has improved. Alkpo4 up-  r/o acalculous cholecysitis   Toxoplasma IgG highly positive indicating reactivation.  Clinically did not have any CNS toxo.  Bactrim can provide primary prophylaxis. So is atovaquone.  RPR 1 is to 4 with positive tPA.  Got a dose of benzathine penicillin.  Will need 2 more doses at weekly interval.  Epstein-Barr virus DNA of 4400 is not significant   ID will follow him peripherally this weekend. Call if needed

## 2020-11-28 NOTE — Progress Notes (Signed)
Provider at bedside pt dyssynchronous with the vent. HR-130s. Per provider prn dose of dilaudid and versed given. Pt HR remains in the 130s and breathing is unchanged at this moment. Will continue to monitor pt

## 2020-11-28 NOTE — Progress Notes (Signed)
NAME:  Jordan Gibson, MRN:  [id number], DOB:  06/19/1980, LOS: 26 ADMISSION DATE:  10/24/2020  Brief History of Present Illness   Jordan Gibson is a 40 y.o. Male with no significant past medical history who presented to [facility] ED on 11/07/20 with a 1 week history of shortness of breath, nonproductive cough, and fever. He was admitted to Acute Hypoxic Respiratory Failure and Sepsis in the setting of suspected Community Acquired Pneumonia.    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 Not on HAART. CD4 count 9, viral load 1.5 million. 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 Also tested positive for Rhinovirus/enterovirus, Syphilis, and Toxoplasma now complicated by ARDS requiring mechanical ventilation support.   Pertinent  Medical History  No significant past medical history   Significant Hospital Events: Including procedures, antibiotic start and stop dates in addition to other pertinent events  11/02/2020: Presented to ED, to be admitted to stepdown unit by hospitalist 11/07/2020: Increasing FiO2 requirements and worsening respiratory status, PCCM consulted due to high risk for deterioration and need for intubation. ABX broadened to Cefepime and Vancomycin, along with Azithromycin 11/08/20- patient with respiratory failure s/p ETT overnight. Vancomycin dcd today MRSA pcr negative. 11/09/20- Pt extubated to HFNC _0  AND 45%.  Patient with severe ARDS. I met with family and reviewed care plan. +Rhino/ENtero virus 11/10/20- patient improved, has been extubated to HFNC.  Tranferring to step down unit, signed out to [facility]. 11/12/20- FiO2 increased to 75%, dcd IV NS at 50/h, have aded lasix 20 bid, decreased prednisone to 40 once daily, stopped po vicodin.  Metaneb ordered BID with RT for recruitment.  Repeat CXR today 11/13/20: Transfferd back to ICU  Due to severe acute hypoxic respiratory failure 11/14/20: Failed HHFN and BiPAP requiring emergent re- intubation. s/p bedside Bronchoscopy 11/15/20: Patient  continues to be hypoxic after increasing FiO2 from 50% to 100% & PEEP from 10 to 12. ABG post these changes revealed a PF ratio of 100 & respiratory acidosis. Proned 6/5 severe hypoxia, ARDS, AIDS, +renal failure 6/7 severe hypoxia, ALI 6/8 severe hypoxia, ARDS initiated PRONING 6/9 SEVERE HYPOXIA  6/10: Severe hypoxia, remains critically ill 6/11: Persistent issues with hypoxemia, increased sedation needs due to ventilator asynchrony 6/12: Persistent issues with ventilator asynchrony, increasing FiO2 requirements, updated sister and have made it very clear that his prognosis is exceedingly poor 11/24/20-  patient on mechanical ventilation on FiO2 100%, there was previosly recurrent dyssynchrony requiring numerous doses of paralytics to reach stable vital signs.  Complex MICU course on >8 infusions simultaneously. Reviewed in detail with RN this morning.  Very unfortunate and extremely ill young patient at high risk of death.  We considered transfer for potential ECMO - [facility] is at capacity unable to accept any patients, [facility] is at capacity unable to take any patients, [facility] able to review case but patient not candidate.  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  Reivewed with Palliative care today.  11/25/20- patient remains on PRVC 100% severe pneumonia with opportunistic infections due to advanced AIDS.  Patient is not responding to therapy with very poor prognosis 11/26/20- patient is worse overnight, he is desaturated to 85% on Fio2 100%.  Reviewed plan with ID and will initiate AIDS therapy today. Met with sister today reviewed goals of care recommendation from me for DNR.  Family wants to keep full code.  11/27/20- Patient is on 100% FiO2, no events overnight, remains high  risk of cardiopulmonary arrest. 11/28/20- Patient on PRVC 100, 15peep, weaning today post lasix challenge.    Cultures:  6/7: Blood culture  x2>> 6/3: PJP PCR:  Positive 6/3: Toxoplasma PCR: negative, IgG positive 6/3: Histoplasma urinary Ag: <0.5 6/3: BAL:  Normal respiratory flora 6/1: RPR: Reactive, titer 1:4, Ab (+) 6/1: Cryptococcus Ag: (-) 5/31: Hep B/C screen: (-) 5/31: Quantiferon-TB: (-) 5/31: Histoplasma Gal[place]an: <0.5 5/31: Fungitell: >500 5/28:Mycoplasma pneumonia ab >>negative 5/28 Tracheal aspirate: Normal respiratory flora 5/28 Respiratory panel: (+) Rhinovirus / enterovirus 5/26: SARS-CoV-2 PCR>> negative 5/26: Influenza PCR>> negative 5/26: Blood culture x2>>No growth thus far 5/26: Urine Cx>>no growth 5/27: MRSA PCR>> negative 5/27:Strep pneumo urinary antigen>>negative 5/27:Legionella urinary antigen>>negative 6/08: CMV DNA over 1 million copies   Antimicrobials:  5/26 Azithromycin >> 5/31 5/26 Cefepime >> 5/30 5/27 Vancomycin > 5/28 5/31 Bactrim >>  6/3 Penicillin G Benzathine >> 6/4 Anidulafungin >>  6/7 Zosyn>>6/9 6/10 ganciclovir>>   Antibiotics Given (last 72 hours)     Date/Time Action Medication Dose Rate   11/25/20 0950 Given   primaquine tablet 30 mg 30 mg    11/25/20 0950 Given   atovaquone (MEPRON) 750 MG/5ML suspension 1,500 mg 1,500 mg    11/25/20 0951 Given  [Bilateral glutes]   penicillin g benzathine (BICILLIN LA) 1200000 UNIT/2ML injection 2.4 Million Units 2.4 Million Units    11/25/20 1129 New Bag/Given   ganciclovir (CYTOVENE) 375 mg in sodium chloride 0.9 % 100 mL IVPB 375 mg 100 mL/hr   11/25/20 1339 New Bag/Given   clindamycin (CLEOCIN) IVPB 900 mg 900 mg 100 mL/hr   11/25/20 2131 Given   atovaquone (MEPRON) 750 MG/5ML suspension 1,500 mg 1,500 mg    11/25/20 2133 New Bag/Given   clindamycin (CLEOCIN) IVPB 900 mg 900 mg 100 mL/hr   11/25/20 2233 New Bag/Given   ganciclovir (CYTOVENE) 375 mg in sodium chloride 0.9 % 100 mL IVPB 375 mg 100 mL/hr   11/26/20 0527 New Bag/Given   clindamycin (CLEOCIN) IVPB 900 mg 900 mg 100 mL/hr   11/26/20 0948 Given   primaquine  tablet 30 mg 30 mg    11/26/20 0949 Given   atovaquone (MEPRON) 750 MG/5ML suspension 1,500 mg 1,500 mg    11/26/20 1222 New Bag/Given  [Hung by VAS team]   ganciclovir (CYTOVENE) 375 mg in sodium chloride 0.9 % 100 mL IVPB 375 mg 100 mL/hr   11/26/20 1348 New Bag/Given   clindamycin (CLEOCIN) IVPB 900 mg 900 mg 100 mL/hr   11/26/20 2141 Given   atovaquone (MEPRON) 750 MG/5ML suspension 1,500 mg 1,500 mg    11/26/20 2142 New Bag/Given   clindamycin (CLEOCIN) IVPB 900 mg 900 mg 100 mL/hr   11/26/20 2336 New Bag/Given   ganciclovir (CYTOVENE) 375 mg in sodium chloride 0.9 % 100 mL IVPB 375 mg 100 mL/hr   11/27/20 0552 New Bag/Given   clindamycin (CLEOCIN) IVPB 900 mg 900 mg 100 mL/hr   11/27/20 1027 Given   primaquine tablet 30 mg 30 mg    11/27/20 1027 Given   atovaquone (MEPRON) 750 MG/5ML suspension 1,500 mg 1,500 mg    11/27/20 1027 Given   dolutegravir (TIVICAY) tablet 50 mg 50 mg    11/27/20 1028 Given   emtricitabine-tenofovir AF (DESCOVY) 200-25 MG per tablet 1 tablet 1 tablet    11/27/20 1039 New Bag/Given   ganciclovir (CYTOVENE) 375 mg in sodium chloride 0.9 % 100 mL IVPB 375 mg 100 mL/hr   11/27/20 1310 New Bag/Given   clindamycin (CLEOCIN)  IVPB 900 mg 900 mg 100 mL/hr   11/27/20 1728 Given   azithromycin (ZITHROMAX) 200 MG/5ML suspension 1,200 mg 1,200 mg    11/27/20 2148 Given   atovaquone (MEPRON) 750 MG/5ML suspension 1,500 mg 1,500 mg    11/27/20 2150 New Bag/Given   clindamycin (CLEOCIN) IVPB 900 mg 900 mg 100 mL/hr   11/27/20 2308 New Bag/Given   ganciclovir (CYTOVENE) 375 mg in sodium chloride 0.9 % 100 mL IVPB 375 mg 100 mL/hr   11/28/20 3329 New Bag/Given   clindamycin (CLEOCIN) IVPB 900 mg 900 mg 100 mL/hr      Scheduled Meds:  atovaquone  1,500 mg Per Tube BID   azithromycin  1,200 mg Per Tube Weekly   chlorhexidine gluconate (MEDLINE KIT)  15 mL Mouth Rinse BID   Chlorhexidine Gluconate Cloth  6 each Topical q morning   dolutegravir  50 mg Per Tube  Daily   emtricitabine-tenofovir AF  1 tablet Per Tube Daily   enoxaparin (LOVENOX) injection  40 mg Subcutaneous Q24H   feeding supplement (PROSource TF)  45 mL Per Tube Daily   free water  20 mL Per Tube Q4H   insulin aspart  0-20 Units Subcutaneous Q4H   insulin aspart  3 Units Subcutaneous Q4H   insulin glargine  7 Units Subcutaneous QHS   mouth rinse  15 mL Mouth Rinse 10 times per day   methylPREDNISolone (SOLU-MEDROL) injection  40 mg Intravenous Q12H   pantoprazole (PROTONIX) IV  40 mg Intravenous Daily   penicillin g benzathine (BICILLIN-LA) IM  2.4 Million Units Intramuscular Weekly   primaquine  30 mg Per Tube Daily   QUEtiapine  25 mg Per Tube QHS   Continuous Infusions:  sodium chloride 5 mL/hr at 11/28/20 0600   clindamycin (CLEOCIN) IV 900 mg (11/28/20 5188)   esmolol Stopped (11/27/20 2311)   feeding supplement (VITAL 1.5 CAL) 40 mL/hr (11/27/20 1307)   ganciclovir Stopped (11/28/20 0008)   HYDROmorphone 4 mg/hr (11/28/20 0600)   ketamine (KETALAR) Adult IV Infusion 2.3 mg/kg/hr (11/28/20 4166)   midazolam 8 mg/hr (11/28/20 0747)   PRN Meds:.acetaminophen, acetaminophen, hydrALAZINE, HYDROmorphone, metoprolol tartrate, midazolam, ondansetron **OR** ondansetron (ZOFRAN) IV, vecuronium  Chest x-ray today:      Objective   Blood pressure 115/65, pulse (!) 114, temperature 97.88 F (36.6 C), temperature source Esophageal, resp. rate (!) 35, height 5' 7.01" (1.702 m), weight 73 kg, SpO2 97 %.    Vent Mode: PRVC FiO2 (%):  [100 %] 100 % Set Rate:  [35 bmp] 35 bmp Vt Set:  [420 mL] 420 mL PEEP:  [15 cmH20] 15 cmH20 Plateau Pressure:  [31 cmH20-32 cmH20] 32 cmH20   Intake/Output Summary (Last 24 hours) at 11/28/2020 0853 Last data filed at 11/28/2020 0600 Gross per 24 hour  Intake 1708.41 ml  Output 1550 ml  Net 158.41 ml    Filed Weights   11/25/20 0405 11/27/20 0437 11/28/20 0440  Weight: 72.8 kg 72.9 kg 73 kg    REVIEW OF SYSTEMS  PATIENT IS UNABLE  TO PROVIDE COMPLETE REVIEW OF SYSTEMS DUE TO SEVERE CRITICAL ILLNESS AND TOXIC METABOLIC ENCEPHALOPATHY   PHYSICAL EXAMINATION:  GENERAL: Critically ill-appearing male, laying in bed, intubated and sedated, asynchronous with the vent HEAD: Normocephalic, atraumatic.  EYES: Pupils equal, round, reactive to light.  No scleral icterus.  MOUTH: Moist mucosal membrane.  ET tube in place NECK: Supple, + JVD 4+ PULMONARY: Coarse breath sounds bilaterally, no wheezing, overbreathing the vent, even CARDIOVASCULAR: S1 and S2. Regular  rate and rhythm. No murmurs, rubs, or gallops.  GASTROINTESTINAL: Soft, nontender, -distended. Positive bowel sounds.  MUSCULOSKELETAL: No swelling, clubbing, or edema.  NEUROLOGIC: sedated on mv GCS4T SKIN: Warm and diaphoretic.  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 leading to severe hypoxic resp failure intubated x 2 with severe b/l ling disease and ARDS/ALI transient renal failrue now ith elevated liver tests with liver dysfunction +PJP PNEUMONIA +TOXO +SYPHILIS +RHINOVIRUS +CMV  Assessment / Plan:   Acute Hypoxic and Hypercapnic Respiratory Failure due to PJP Pneumonia Severe ARDS -Full vent support, implement lung protective strategies via ARDS Net Protocol -Reiterated ARDS protocol with VT 400 equals to 6 mL/kg, ABG to follow -Wean FiO2 and PEEP as tolerated maintain O2 sats greater than 90% -Follow intermittent chest x-ray and ABG as needed -Spontaneous breathing trials when respiratory parameters met -Continue VAP bundle -As needed bronchodilators -Continue antibiotics  Septic Shock -Continuous cardiac monitoring -Maintain MAP greater than 65 -IV fluids -Vasopressors as needed to maintain MAP goal -2D Echo 11/08/20  :LVEF 60-65%, normal RV systolic function -Persistent shock physiology bodes for very poor prognosis -Persistent requirement of vasopressors.  Severe Sepsis due to PJP Pneumonia Newly diagnosed AIDS Persistent Fever -Monitor fever curve -Trend WBCs -Follow cultures -ID following, antibiotics as per ID -Currently on Bactrim after CMV titer back. Bactrim better for PJP/Toxo -CD4 is 7 and VL 1.5 million copies -c/w HAART after 7-10 days of PCP treatment per ID-monitor for IRIS -gancyclovir for CMV > 1 million copies  Hyponatremia Volume overload -Monitor I&O's / urinary output -Follow BMP -Ensure adequate renal perfusion -Avoid nephrotoxic agents as able -Replace electrolytes as indicated -Repeat Lasix x1today  Mild Transaminitis -Negative viral hepatitis panel -Trend LFT's  Acute toxic metabolic encephalopathy, need for sedation -Goal RASS -2 to -3 given severe ARDS -Dilaudid & Versed, and Precedex as needed to maintain RASS goal -Avoid sedating meds as able -SAT as tolerates, not stable for same due to high FiO2 requirement -Provide supportive care  Hyperglycemia -CBG's -SSI -Follow ICU Hypo/Hyperglycemia protocol   Pt is critically ill.  Prognosis is guarded. High risk for cardiac arrest and death.  Best practice (right click and "Reselect all SmartList Selections" daily)  Diet:  Tube Feed Pain/Anxiety/Delirium protocol (if indicated): Yes (RASS goal -2 to -3) VAP protocol (if indicated): Yes DVT prophylaxis: Lovenox GI prophylaxis: PPI Glucose control:  SSI Yes Central venous access:  Yes, and it is still needed Arterial line:  N/A Foley:  Yes, and it is still needed Mobility:  bed rest  PT consulted: N/A Last date of multidisciplinary goals of care discussion [11/23/20] we will place palliative care consultation Code Status:  full code Disposition: ICU    Labs   CBC: Recent Labs  Lab 11/24/20 0435 11/25/20 0510 11/26/20 0520 11/27/20 0441  11/28/20 0431  WBC 9.6 9.8 9.9 8.0 5.0  NEUTROABS 8.8* 8.9* 8.6* 7.0 4.3  HGB 9.4* 9.9* 10.6* 9.9* 8.6*  HCT 30.3* 32.0* 34.1* 31.2* 29.0*  MCV 89.4 88.6 88.8 88.6 92.9  PLT 167 175 198 187 196     Basic Metabolic Panel: Recent Labs  Lab 11/24/20 0435 11/25/20 0510 11/26/20 0520 11/27/20 0441 11/28/20 0431  NA 129* 134* 136 137 140  K 5.1 4.5 4.2 4.6 4.4  CL 91* 92* 87* 89* 93*  CO2 34*  34* 38* 40* 39*  GLUCOSE 261* 205* 157* 103* 151*  BUN _0 23* 24*  CREATININE 0.58* 0.72 0.55* 0.44* 0.45*  CALCIUM 7.9* 8.7* 8.5* 8.6* 8.5*  MG 2.1 2.2 2.0 2.1 2.1  PHOS 3.6 3.9 3.6 3.8 3.1    GFR: Estimated Creatinine Clearance: 113.6 mL/min (A) (by C-G formula based on SCr of 0.45 mg/dL (L)). Recent Labs  Lab 11/25/20 0510 11/26/20 0520 11/27/20 0441 11/28/20 0431  WBC 9.8 9.9 8.0 5.0     Liver Function Tests: Recent Labs  Lab 11/24/20 0435 11/25/20 0510 11/26/20 0520 11/27/20 0441 11/28/20 0431  AST 221* 132* 64* 60* 44*  ALT 454* 374* 254* 188* 130*  ALKPHOS 366* 354* 338* 460* 390*  BILITOT 0.6 0.7 0.9 1.2 0.9  PROT 5.5* 6.2* 6.3* 6.3* 5.8*  ALBUMIN 1.8* 2.0* 2.1* 2.0* 1.9*    No results for input(s): LIPASE, AMYLASE in the last 168 hours. No results for input(s): AMMONIA in the last 168 hours.  ABG    Component Value Date/Time   PHART 7.28 (L) 11/25/2020 0500   PCO2ART 72 (HH) 11/25/2020 0500   PO2ART 69 (L) 11/25/2020 0500   HCO3 33.8 (H) 11/25/2020 0500   ACIDBASEDEF 2.3 (H) 11/16/2020 0441   O2SAT 91.1 11/25/2020 0500      Coagulation Profile: No results for input(s): INR, PROTIME in the last 168 hours.  Cardiac Enzymes: Recent Labs  Lab 11/23/20 0528  CKTOTAL 19*     HbA1C: Hgb A1c MFr Bld  Date/Time Value Ref Range Status  11/12/2020 05:37 AM 5.9 (H) 4.8 - 5.6 % Final    Comment:    (NOTE)         Prediabetes: 5.7 - 6.4         Diabetes: >6.4         Glycemic control for adults with diabetes: <7.0     CBG: Recent Labs   Lab 11/27/20 1527 11/27/20 1948 11/27/20 2345 11/28/20 0331 11/28/20 0724  GLUCAP 137* 235* 159* 135* 108*     Allergies No Known Allergies   Critical care provider statement:    Critical care time (minutes):  33   Critical care time was exclusive of:  Separately billable procedures and  treating other patients   Critical care was necessary to treat or prevent imminent or  life-threatening deterioration of the following conditions:  AIDS, ARDS, JPJ pna, CMV pneumnia, severe ARDS   Critical care was time spent personally by me on the following  activities:  Development of treatment plan with patient or surrogate,  discussions with consultants, evaluation of patient's response to  treatment, examination of patient, obtaining history from patient or  surrogate, ordering and performing treatments and interventions, ordering  and review of laboratory studies and re-evaluation of patient's condition   I assumed direction of critical care for this patient from another  provider in my specialty: no     *This note was dictated using voice recognition software/Dragon.  Despite best efforts to proofread, errors can occur which can change the meaning.  Any change was purely unintentional.    Ottie Glazier, M.D.  Pulmonary & [place]

## 2020-11-29 ENCOUNTER — Inpatient Hospital Stay: Payer: Medicaid Other

## 2020-11-29 LAB — COMPREHENSIVE METABOLIC PANEL
ALT: 157 U/L — ABNORMAL HIGH (ref 0–44)
AST: 79 U/L — ABNORMAL HIGH (ref 15–41)
Albumin: 2 g/dL — ABNORMAL LOW (ref 3.5–5.0)
Alkaline Phosphatase: 515 U/L — ABNORMAL HIGH (ref 38–126)
Anion gap: 5 (ref 5–15)
BUN: 21 mg/dL — ABNORMAL HIGH (ref 6–20)
CO2: 46 mmol/L — ABNORMAL HIGH (ref 22–32)
Calcium: 8.4 mg/dL — ABNORMAL LOW (ref 8.9–10.3)
Chloride: 92 mmol/L — ABNORMAL LOW (ref 98–111)
Creatinine, Ser: 0.33 mg/dL — ABNORMAL LOW (ref 0.61–1.24)
GFR, Estimated: >60 mL/min (ref >60)
Glucose, Bld: 77 mg/dL (ref 70–99)
Potassium: 3.6 mmol/L (ref 3.5–5.1)
Sodium: 143 mmol/L (ref 135–145)
Total Bilirubin: 0.9 mg/dL (ref 0.3–1.2)
Total Protein: 5.6 g/dL — ABNORMAL LOW (ref 6.5–8.1)

## 2020-11-29 LAB — CBC WITH DIFFERENTIAL/PLATELET
Abs Immature Granulocytes: 0.04 10*3/uL (ref 0.00–0.07)
Basophils Absolute: 0 10*3/uL (ref 0.0–0.1)
Basophils Relative: 0 %
Eosinophils Absolute: 0.1 10*3/uL (ref 0.0–0.5)
Eosinophils Relative: 5 %
HCT: 28.4 % — ABNORMAL LOW (ref 39.0–52.0)
Hemoglobin: 8.2 g/dL — ABNORMAL LOW (ref 13.0–17.0)
Immature Granulocytes: 1 %
Lymphocytes Relative: 7 %
Lymphs Abs: 0.2 10*3/uL — ABNORMAL LOW (ref 0.7–4.0)
MCH: 27.6 pg (ref 26.0–34.0)
MCHC: 28.9 g/dL — ABNORMAL LOW (ref 30.0–36.0)
MCV: 95.6 fL (ref 80.0–100.0)
Monocytes Absolute: 0.2 10*3/uL (ref 0.1–1.0)
Monocytes Relative: 8 %
Neutro Abs: 2.5 10*3/uL (ref 1.7–7.7)
Neutrophils Relative %: 79 %
Platelets: 188 10*3/uL (ref 150–400)
RBC: 2.97 MIL/uL — ABNORMAL LOW (ref 4.22–5.81)
RDW: 16.4 % — ABNORMAL HIGH (ref 11.5–15.5)
WBC: 3.1 10*3/uL — ABNORMAL LOW (ref 4.0–10.5)
nRBC: 0.6 % — ABNORMAL HIGH (ref 0.0–0.2)

## 2020-11-29 LAB — GLUCOSE, CAPILLARY
Glucose-Capillary: 102 mg/dL — ABNORMAL HIGH (ref 70–99)
Glucose-Capillary: 107 mg/dL — ABNORMAL HIGH (ref 70–99)
Glucose-Capillary: 125 mg/dL — ABNORMAL HIGH (ref 70–99)
Glucose-Capillary: 154 mg/dL — ABNORMAL HIGH (ref 70–99)
Glucose-Capillary: 157 mg/dL — ABNORMAL HIGH (ref 70–99)
Glucose-Capillary: 79 mg/dL (ref 70–99)
Glucose-Capillary: 86 mg/dL (ref 70–99)

## 2020-11-29 LAB — MAGNESIUM: Magnesium: 2.2 mg/dL (ref 1.7–2.4)

## 2020-11-29 LAB — PHOSPHORUS: Phosphorus: 3.2 mg/dL (ref 2.5–4.6)

## 2020-11-29 MED ORDER — CHLORHEXIDINE GLUCONATE 0.12% ORAL RINSE (MEDLINE KIT)
15.0000 mL | Freq: Two times a day (BID) | OROMUCOSAL | Status: DC
  Administered 2020-11-29 – 2020-12-02 (×7): 15 mL via OROMUCOSAL

## 2020-11-29 MED ORDER — ORAL CARE MOUTH RINSE
15.0000 mL | OROMUCOSAL | Status: DC
  Administered 2020-11-29 – 2020-12-02 (×34): 15 mL via OROMUCOSAL

## 2020-11-29 MED ORDER — PHENYLEPHRINE CONCENTRATED 100MG/250ML (0.4 MG/ML) INFUSION SIMPLE
0.0000 ug/min | INTRAVENOUS | Status: DC
  Administered 2020-11-29: 40 ug/min via INTRAVENOUS
  Administered 2020-12-02: 400 ug/min via INTRAVENOUS
  Administered 2020-12-02: 100 ug/min via INTRAVENOUS
  Administered 2020-12-02: 20 ug/min via INTRAVENOUS
  Filled 2020-11-29 (×2): qty 250

## 2020-11-29 MED ORDER — FUROSEMIDE 10 MG/ML IJ SOLN
80.0000 mg | Freq: Once | INTRAMUSCULAR | Status: AC
  Administered 2020-11-29: 80 mg via INTRAVENOUS
  Filled 2020-11-29: qty 8

## 2020-11-29 NOTE — Progress Notes (Signed)
NAME:  Jordan Gibson, MRN:  [id number], DOB:  [birth year], LOS: 23 ADMISSION DATE:  10/14/2020  Brief History of Present Illness   Jordan Gibson is a 40 y.o. Male with no significant past medical history who presented to [facility] ED on 11/07/20 with a 1 week history of shortness of breath, nonproductive cough, and fever. He was admitted to Acute Hypoxic Respiratory Failure and Sepsis in the setting of suspected Community Acquired Pneumonia.    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 Not on HAART. CD4 count 9, viral load 1.5 million. 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 Also tested positive for Rhinovirus/enterovirus, Syphilis, and Toxoplasma now complicated by ARDS requiring mechanical ventilation support.   Pertinent  Medical History  No significant past medical history   Significant Hospital Events: Including procedures, antibiotic start and stop dates in addition to other pertinent events  10/25/2020: Presented to ED, to be admitted to stepdown unit by hospitalist 11/07/2020: Increasing FiO2 requirements and worsening respiratory status, PCCM consulted due to high risk for deterioration and need for intubation. ABX broadened to Cefepime and Vancomycin, along with Azithromycin 11/08/20- patient with respiratory failure s/p ETT overnight. Vancomycin dcd today MRSA pcr negative. 11/09/20- Pt extubated to HFNC _0  AND 45%.  Patient with severe ARDS. I met with family and reviewed care plan. +Rhino/ENtero virus 11/10/20- patient improved, has been extubated to HFNC.  Tranferring to step down unit, signed out to [facility]. 11/12/20- FiO2 increased to 75%, dcd IV NS at 50/h, have aded lasix 20 bid, decreased prednisone to 40 once daily, stopped po vicodin.  Metaneb ordered BID with RT for recruitment.  Repeat CXR today 11/13/20: Transfferd back to ICU  Due to severe acute hypoxic respiratory failure 11/14/20: Failed HHFN and BiPAP requiring emergent re- intubation. s/p bedside Bronchoscopy 11/15/20: Patient  continues to be hypoxic after increasing FiO2 from 50% to 100% & PEEP from 10 to 12. ABG post these changes revealed a PF ratio of 100 & respiratory acidosis. Proned 6/5 severe hypoxia, ARDS, AIDS, +renal failure 6/7 severe hypoxia, ALI 6/8 severe hypoxia, ARDS initiated PRONING 6/9 SEVERE HYPOXIA  6/10: Severe hypoxia, remains critically ill 6/11: Persistent issues with hypoxemia, increased sedation needs due to ventilator asynchrony 6/12: Persistent issues with ventilator asynchrony, increasing FiO2 requirements, updated sister and have made it very clear that his prognosis is exceedingly poor 11/24/20-  patient on mechanical ventilation on FiO2 100%, there was previosly recurrent dyssynchrony requiring numerous doses of paralytics to reach stable vital signs.  Complex MICU course on >8 infusions simultaneously. Reviewed in detail with RN this morning.  Very unfortunate and extremely ill young patient at high risk of death.  We considered transfer for potential ECMO - [facility] is at capacity unable to accept any patients, [facility] is at capacity unable to take any patients, [facility] able to review case but patient not candidate.  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  Reivewed with Palliative care today.  11/25/20- patient remains on PRVC 100% severe pneumonia with opportunistic infections due to advanced AIDS.  Patient is not responding to therapy with very poor prognosis 11/26/20- patient is worse overnight, he is desaturated to 85% on Fio2 100%.  Reviewed plan with ID and will initiate AIDS therapy today. Met with sister today reviewed goals of care recommendation from me for DNR.  Family wants to keep full code.  11/27/20- Patient is on 100% FiO2, no events overnight, remains high  risk of cardiopulmonary arrest. 11/28/20- Patient on PRVC 100, 15peep, patient on maximal support on ventilator 100% FiO2 11/29/20-patient was  able to make over 1 L of urine today with Lasix however subsequently developed hypotension and is requiring low-dose Neo-Synephrine support.  Met with family today reviewed medical plan.  Cultures:  6/7: Blood culture x2>> 6/3: PJP PCR:  Positive 6/3: Toxoplasma PCR: negative, IgG positive 6/3: Histoplasma urinary Ag: <0.5 6/3: BAL:  Normal respiratory flora 6/1: RPR: Reactive, titer 1:4, Ab (+) 6/1: Cryptococcus Ag: (-) 5/31: Hep B/C screen: (-) 5/31: Quantiferon-TB: (-) 5/31: Histoplasma Gal[place]an: <0.5 5/31: Fungitell: >500 5/28:Mycoplasma pneumonia ab >>negative 5/28 Tracheal aspirate: Normal respiratory flora 5/28 Respiratory panel: (+) Rhinovirus / enterovirus 5/26: SARS-CoV-2 PCR>> negative 5/26: Influenza PCR>> negative 5/26: Blood culture x2>>No growth thus far 5/26: Urine Cx>>no growth 5/27: MRSA PCR>> negative 5/27:Strep pneumo urinary antigen>>negative 5/27:Legionella urinary antigen>>negative 6/08: CMV DNA over 1 million copies   Antimicrobials:  5/26 Azithromycin >> 5/31 5/26 Cefepime >> 5/30 5/27 Vancomycin > 5/28 5/31 Bactrim >>  6/3 Penicillin G Benzathine >> 6/4 Anidulafungin >>  6/7 Zosyn>>6/9 6/10 ganciclovir>>   Antibiotics Given (last 72 hours)     Date/Time Action Medication Dose Rate   11/26/20 0948 Given   primaquine tablet 30 mg 30 mg    11/26/20 0949 Given   atovaquone (MEPRON) 750 MG/5ML suspension 1,500 mg 1,500 mg    11/26/20 1222 New Bag/Given  [Hung by VAS team]   ganciclovir (CYTOVENE) 375 mg in sodium chloride 0.9 % 100 mL IVPB 375 mg 100 mL/hr   11/26/20 1348 New Bag/Given   clindamycin (CLEOCIN) IVPB 900 mg 900 mg 100 mL/hr   11/26/20 2141 Given   atovaquone (MEPRON) 750 MG/5ML suspension 1,500 mg 1,500 mg    11/26/20 2142 New Bag/Given   clindamycin (CLEOCIN) IVPB 900 mg 900 mg 100 mL/hr   11/26/20 2336 New Bag/Given   ganciclovir (CYTOVENE) 375 mg in sodium chloride 0.9 % 100 mL IVPB 375 mg 100 mL/hr   11/27/20 0552 New  Bag/Given   clindamycin (CLEOCIN) IVPB 900 mg 900 mg 100 mL/hr   11/27/20 1027 Given   primaquine tablet 30 mg 30 mg    11/27/20 1027 Given   atovaquone (MEPRON) 750 MG/5ML suspension 1,500 mg 1,500 mg    11/27/20 1027 Given   dolutegravir (TIVICAY) tablet 50 mg 50 mg    11/27/20 1028 Given   emtricitabine-tenofovir AF (DESCOVY) 200-25 MG per tablet 1 tablet 1 tablet    11/27/20 1039 New Bag/Given   ganciclovir (CYTOVENE) 375 mg in sodium chloride 0.9 % 100 mL IVPB 375 mg 100 mL/hr   11/27/20 1310 New Bag/Given   clindamycin (CLEOCIN) IVPB 900 mg 900 mg 100 mL/hr   11/27/20 1728 Given   azithromycin (ZITHROMAX) 200 MG/5ML suspension 1,200 mg 1,200 mg    11/27/20 2148 Given   atovaquone (MEPRON) 750 MG/5ML suspension 1,500 mg 1,500 mg    11/27/20 2150 New Bag/Given   clindamycin (CLEOCIN) IVPB 900 mg 900 mg 100 mL/hr   11/27/20 2308 New Bag/Given   ganciclovir (CYTOVENE) 375 mg in sodium chloride 0.9 % 100 mL IVPB 375 mg 100 mL/hr   11/28/20 0614 New Bag/Given   clindamycin (CLEOCIN) IVPB 900 mg 900 mg 100 mL/hr   11/28/20 0906 Given   primaquine tablet 30 mg 30 mg    11/28/20 0906 Given   atovaquone (MEPRON) 750 MG/5ML suspension 1,500 mg 1,500 mg    11/28/20 0906 Given   dolutegravir (TIVICAY) tablet  50 mg 50 mg    11/28/20 0907 Given   emtricitabine-tenofovir AF (DESCOVY) 200-25 MG per tablet 1 tablet 1 tablet    11/28/20 1021 New Bag/Given   ganciclovir (CYTOVENE) 375 mg in sodium chloride 0.9 % 100 mL IVPB 375 mg 100 mL/hr   11/28/20 1417 New Bag/Given   clindamycin (CLEOCIN) IVPB 900 mg 900 mg 100 mL/hr   11/28/20 2138 New Bag/Given   clindamycin (CLEOCIN) IVPB 900 mg 900 mg 100 mL/hr   11/28/20 2257 Given   atovaquone (MEPRON) 750 MG/5ML suspension 1,500 mg 1,500 mg    11/29/20 0017 New Bag/Given   ganciclovir (CYTOVENE) 375 mg in sodium chloride 0.9 % 100 mL IVPB 375 mg 100 mL/hr   11/29/20 0540 New Bag/Given   clindamycin (CLEOCIN) IVPB 900 mg 900 mg 100 mL/hr       Scheduled Meds:  atovaquone  1,500 mg Per Tube BID   azithromycin  1,200 mg Per Tube Weekly   chlorhexidine gluconate (MEDLINE KIT)  15 mL Mouth Rinse BID   Chlorhexidine Gluconate Cloth  6 each Topical q morning   dolutegravir  50 mg Per Tube Daily   emtricitabine-tenofovir AF  1 tablet Per Tube Daily   enoxaparin (LOVENOX) injection  40 mg Subcutaneous Q24H   feeding supplement (PROSource TF)  45 mL Per Tube Daily   free water  20 mL Per Tube Q4H   insulin aspart  0-20 Units Subcutaneous Q4H   insulin aspart  3 Units Subcutaneous Q4H   insulin glargine  7 Units Subcutaneous QHS   mouth rinse  15 mL Mouth Rinse 10 times per day   methylPREDNISolone (SOLU-MEDROL) injection  40 mg Intravenous Q24H   pantoprazole (PROTONIX) IV  40 mg Intravenous Daily   penicillin g benzathine (BICILLIN-LA) IM  2.4 Million Units Intramuscular Weekly   primaquine  30 mg Per Tube Daily   QUEtiapine  25 mg Per Tube QHS   Continuous Infusions:  sodium chloride 10 mL/hr at 11/29/20 0831   clindamycin (CLEOCIN) IV Stopped (11/29/20 6286)   feeding supplement (VITAL 1.5 CAL) 1,000 mL (11/28/20 1142)   ganciclovir Stopped (11/29/20 0250)   HYDROmorphone 4 mg/hr (11/29/20 0831)   ketamine (KETALAR) Adult IV Infusion 2.5 mg/kg/hr (11/29/20 0831)   midazolam 10 mg/hr (11/29/20 0831)   PRN Meds:.acetaminophen, acetaminophen, hydrALAZINE, HYDROmorphone, metoprolol tartrate, midazolam, ondansetron **OR** ondansetron (ZOFRAN) IV, vecuronium  Chest x-ray today:      Objective   Blood pressure 112/62, pulse (!) 125, temperature 99.86 F (37.7 C), resp. rate (!) 28, height 5' 7.01" (1.702 m), weight 73 kg, SpO2 97 %.    Vent Mode: PRVC FiO2 (%):  [100 %] 100 % Set Rate:  [35 bmp] 35 bmp Vt Set:  [420 mL] 420 mL PEEP:  [15 cmH20] 15 cmH20 Plateau Pressure:  [41 cmH20] 41 cmH20   Intake/Output Summary (Last 24 hours) at 11/29/2020 3817 Last data filed at 11/29/2020 0831 Gross per 24 hour  Intake 2547.1  ml  Output 3200 ml  Net -652.9 ml    Filed Weights   11/25/20 0405 11/27/20 0437 11/28/20 0440  Weight: 72.8 kg 72.9 kg 73 kg    REVIEW OF SYSTEMS  PATIENT IS UNABLE TO PROVIDE COMPLETE REVIEW OF SYSTEMS DUE TO SEVERE CRITICAL ILLNESS AND TOXIC METABOLIC ENCEPHALOPATHY   PHYSICAL EXAMINATION:  GENERAL: Critically ill-appearing male, laying in bed, intubated and sedated, asynchronous with the vent HEAD: Normocephalic, atraumatic.  EYES: Pupils equal, round, reactive to light.  No scleral icterus.  MOUTH: Moist mucosal membrane.  ET tube in place NECK: Supple, + JVD 4+ PULMONARY: Coarse breath sounds bilaterally, no wheezing, overbreathing the vent, even CARDIOVASCULAR: S1 and S2. Regular rate and rhythm. No murmurs, rubs, or gallops.  GASTROINTESTINAL: Soft, nontender, -distended. Positive bowel sounds.  MUSCULOSKELETAL: No swelling, clubbing, or edema.  NEUROLOGIC: sedated on mv GCS4T SKIN: Warm and diaphoretic.  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 leading to severe hypoxic resp failure intubated x 2 with severe b/l ling disease and ARDS/ALI transient renal failrue now ith elevated liver tests with liver dysfunction +PJP PNEUMONIA +TOXO +SYPHILIS +RHINOVIRUS +CMV  Assessment / Plan:   Acute Hypoxic and Hypercapnic Respiratory Failure due to PJP Pneumonia Severe ARDS -Full vent support, implement lung protective strategies via ARDS Net Protocol -Reiterated ARDS protocol with VT 400 equals to 6 mL/kg, ABG to follow -Wean FiO2 and PEEP as tolerated maintain O2 sats greater than 90% -Follow intermittent chest x-ray and ABG as needed -Spontaneous breathing trials when respiratory parameters met -Continue VAP bundle -As needed  bronchodilators -Continue antibiotics  Septic Shock -Continuous cardiac monitoring -Maintain MAP greater than 65 -IV fluids -Vasopressors as needed to maintain MAP goal -2D Echo 11/08/20 :LVEF 60-65%, normal RV systolic function -Persistent shock physiology bodes for very poor prognosis -Persistent requirement of vasopressors.  Severe Sepsis due to PJP Pneumonia Newly diagnosed AIDS Persistent Fever -Monitor fever curve -Trend WBCs -Follow cultures -ID following, antibiotics as per ID -Currently on Bactrim after CMV titer back. Bactrim better for PJP/Toxo -CD4 is 7 and VL 1.5 million copies -c/w HAART after 7-10 days of PCP treatment per ID-monitor for IRIS -gancyclovir for CMV > 1 million copies  Hyponatremia Volume overload -Monitor I&O's / urinary output -Follow BMP -Ensure adequate renal perfusion -Avoid nephrotoxic agents as able -Replace electrolytes as indicated -Repeat Lasix x1today  Mild Transaminitis -Negative viral hepatitis panel -Trend LFT's  Acute toxic metabolic encephalopathy, need for sedation -Goal RASS -2 to -3 given severe ARDS -Dilaudid & Versed, and Precedex as needed to maintain RASS goal -Avoid sedating meds as able -SAT as tolerates, not stable for same due to high FiO2 requirement -Provide supportive care  Hyperglycemia -CBG's -SSI -Follow ICU Hypo/Hyperglycemia protocol   Pt is critically ill.  Prognosis is guarded. High risk for cardiac arrest and death.  Best practice (right click and "Reselect all SmartList Selections" daily)  Diet:  Tube Feed Pain/Anxiety/Delirium protocol (if indicated): Yes (RASS goal -2 to -3) VAP protocol (if indicated): Yes DVT prophylaxis: Lovenox GI prophylaxis: PPI Glucose control:  SSI Yes Central venous access:  Yes, and it is still needed Arterial line:  N/A Foley:  Yes, and it is still needed Mobility:  bed rest  PT consulted: N/A Last date of multidisciplinary goals of care discussion [11/23/20]  we will place palliative care consultation Code Status:  full code Disposition: ICU    Labs   CBC: Recent Labs  Lab 11/25/20 0510 11/26/20 0520 11/27/20 0441 11/28/20 0431 11/29/20 0657  WBC 9.8 9.9 8.0 5.0 3.1*  NEUTROABS 8.9* 8.6* 7.0 4.3 2.5  HGB 9.9* 10.6* 9.9* 8.6* 8.2*  HCT 32.0* 34.1* 31.2* 29.0* 28.4*  MCV 88.6 88.8 88.6 92.9 95.6  PLT 175 198 187 196 188     Basic Metabolic Panel: Recent Labs  Lab 11/25/20 0510 11/26/20 0520  11/27/20 0441 11/28/20 0431 11/29/20 0657  NA 134* 136 137 140 143  K 4.5 4.2 4.6 4.4 3.6  CL 92* 87* 89* 93* 92*  CO2 34* 38* 40* 39* 46*  GLUCOSE 205* 157* 103* 151* 77  BUN 17 17 23* 24* 21*  CREATININE 0.72 0.55* 0.44* 0.45* 0.33*  CALCIUM 8.7* 8.5* 8.6* 8.5* 8.4*  MG 2.2 2.0 2.1 2.1 2.2  PHOS 3.9 3.6 3.8 3.1 3.2    GFR: Estimated Creatinine Clearance: 113.6 mL/min (A) (by C-G formula based on SCr of 0.33 mg/dL (L)). Recent Labs  Lab 11/26/20 0520 11/27/20 0441 11/28/20 0431 11/29/20 0657  WBC 9.9 8.0 5.0 3.1*     Liver Function Tests: Recent Labs  Lab 11/25/20 0510 11/26/20 0520 11/27/20 0441 11/28/20 0431 11/29/20 0657  AST 132* 64* 60* 44* 79*  ALT 374* 254* 188* 130* 157*  ALKPHOS 354* 338* 460* 390* 515*  BILITOT 0.7 0.9 1.2 0.9 0.9  PROT 6.2* 6.3* 6.3* 5.8* 5.6*  ALBUMIN 2.0* 2.1* 2.0* 1.9* 2.0*    No results for input(s): LIPASE, AMYLASE in the last 168 hours. No results for input(s): AMMONIA in the last 168 hours.  ABG    Component Value Date/Time   PHART 7.28 (L) 11/25/2020 0500   PCO2ART 72 (HH) 11/25/2020 0500   PO2ART 69 (L) 11/25/2020 0500   HCO3 33.8 (H) 11/25/2020 0500   ACIDBASEDEF 2.3 (H) 11/16/2020 0441   O2SAT 91.1 11/25/2020 0500      Coagulation Profile: No results for input(s): INR, PROTIME in the last 168 hours.  Cardiac Enzymes: Recent Labs  Lab 11/23/20 0528  CKTOTAL 19*     HbA1C: Hgb A1c MFr Bld  Date/Time Value Ref Range Status  11/12/2020 05:37 AM 5.9 (H)  4.8 - 5.6 % Final    Comment:    (NOTE)         Prediabetes: 5.7 - 6.4         Diabetes: >6.4         Glycemic control for adults with diabetes: <7.0     CBG: Recent Labs  Lab 11/28/20 1549 11/28/20 1931 11/29/20 0007 11/29/20 0347 11/29/20 0725  GLUCAP 131* 130* 157* 79 86     Allergies No Known Allergies   Critical care provider statement:    Critical care time (minutes):  33   Critical care time was exclusive of:  Separately billable procedures and  treating other patients   Critical care was necessary to treat or prevent imminent or  life-threatening deterioration of the following conditions:  AIDS, ARDS, JPJ pna, CMV pneumnia, severe ARDS   Critical care was time spent personally by me on the following  activities:  Development of treatment plan with patient or surrogate,  discussions with consultants, evaluation of patient's response to  treatment, examination of patient, obtaining history from patient or  surrogate, ordering and performing treatments and interventions, ordering  and review of laboratory studies and re-evaluation of patient's condition   I assumed direction of critical care for this patient from another  provider in my specialty: no     *This note was dictated using voice recognition software/Dragon.  Despite best efforts to proofread, errors can occur which can change the meaning.  Any change was purely unintentional.    Ottie Glazier, M.D.  [facility]

## 2020-11-30 ENCOUNTER — Inpatient Hospital Stay: Payer: Medicaid Other

## 2020-11-30 LAB — CBC WITH DIFFERENTIAL/PLATELET
Abs Immature Granulocytes: 0.03 10*3/uL (ref 0.00–0.07)
Basophils Absolute: 0 10*3/uL (ref 0.0–0.1)
Basophils Relative: 0 %
Eosinophils Absolute: 0.1 10*3/uL (ref 0.0–0.5)
Eosinophils Relative: 4 %
HCT: 28.7 % — ABNORMAL LOW (ref 39.0–52.0)
Hemoglobin: 8.4 g/dL — ABNORMAL LOW (ref 13.0–17.0)
Immature Granulocytes: 1 %
Lymphocytes Relative: 11 %
Lymphs Abs: 0.3 10*3/uL — ABNORMAL LOW (ref 0.7–4.0)
MCH: 28.2 pg (ref 26.0–34.0)
MCHC: 29.3 g/dL — ABNORMAL LOW (ref 30.0–36.0)
MCV: 96.3 fL (ref 80.0–100.0)
Monocytes Absolute: 0.2 10*3/uL (ref 0.1–1.0)
Monocytes Relative: 9 %
Neutro Abs: 2 10*3/uL (ref 1.7–7.7)
Neutrophils Relative %: 75 %
Platelets: 212 10*3/uL (ref 150–400)
RBC: 2.98 MIL/uL — ABNORMAL LOW (ref 4.22–5.81)
RDW: 16.9 % — ABNORMAL HIGH (ref 11.5–15.5)
WBC: 2.7 10*3/uL — ABNORMAL LOW (ref 4.0–10.5)
nRBC: 0 % (ref 0.0–0.2)

## 2020-11-30 LAB — COMPREHENSIVE METABOLIC PANEL
ALT: 133 U/L — ABNORMAL HIGH (ref 0–44)
AST: 77 U/L — ABNORMAL HIGH (ref 15–41)
Albumin: 2.1 g/dL — ABNORMAL LOW (ref 3.5–5.0)
Alkaline Phosphatase: 556 U/L — ABNORMAL HIGH (ref 38–126)
Anion gap: 3 — ABNORMAL LOW (ref 5–15)
BUN: 22 mg/dL — ABNORMAL HIGH (ref 6–20)
CO2: 47 mmol/L — ABNORMAL HIGH (ref 22–32)
Calcium: 8.2 mg/dL — ABNORMAL LOW (ref 8.9–10.3)
Chloride: 95 mmol/L — ABNORMAL LOW (ref 98–111)
Creatinine, Ser: 0.52 mg/dL — ABNORMAL LOW (ref 0.61–1.24)
GFR, Estimated: >60 mL/min (ref >60)
Glucose, Bld: 118 mg/dL — ABNORMAL HIGH (ref 70–99)
Potassium: 3.9 mmol/L (ref 3.5–5.1)
Sodium: 145 mmol/L (ref 135–145)
Total Bilirubin: 0.8 mg/dL (ref 0.3–1.2)
Total Protein: 6 g/dL — ABNORMAL LOW (ref 6.5–8.1)

## 2020-11-30 LAB — GLUCOSE, CAPILLARY
Glucose-Capillary: 119 mg/dL — ABNORMAL HIGH (ref 70–99)
Glucose-Capillary: 133 mg/dL — ABNORMAL HIGH (ref 70–99)
Glucose-Capillary: 155 mg/dL — ABNORMAL HIGH (ref 70–99)
Glucose-Capillary: 166 mg/dL — ABNORMAL HIGH (ref 70–99)
Glucose-Capillary: 169 mg/dL — ABNORMAL HIGH (ref 70–99)
Glucose-Capillary: 95 mg/dL (ref 70–99)

## 2020-11-30 LAB — PHOSPHORUS: Phosphorus: 2.9 mg/dL (ref 2.5–4.6)

## 2020-11-30 LAB — MAGNESIUM: Magnesium: 2.2 mg/dL (ref 1.7–2.4)

## 2020-11-30 MED FILL — Sodium Chloride IV Soln 0.9%: INTRAVENOUS | Qty: 250 | Status: AC

## 2020-11-30 MED FILL — Phenylephrine HCl IV Soln 10 MG/ML: INTRAVENOUS | Qty: 10 | Status: AC

## 2020-11-30 NOTE — Progress Notes (Signed)
NAME:  Jordan Gibson, MRN:  [id number], DOB:  [birth year], LOS: 24 ADMISSION DATE:  10/23/2020  Brief History of Present Illness   Jordan Gibson is a 40 y.o. Male with no significant past medical history who presented to [facility] ED on 11/07/20 with a 1 week history of shortness of breath, nonproductive cough, and fever. He was admitted to Acute Hypoxic Respiratory Failure and Sepsis in the setting of suspected Community Acquired Pneumonia.    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 Not on HAART. CD4 count 9, viral load 1.5 million. 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 Also tested positive for Rhinovirus/enterovirus, Syphilis, and Toxoplasma now complicated by ARDS requiring mechanical ventilation support.   Pertinent  Medical History  No significant past medical history   Significant Hospital Events: Including procedures, antibiotic start and stop dates in addition to other pertinent events  11/01/2020: Presented to ED, to be admitted to stepdown unit by hospitalist 11/07/2020: Increasing FiO2 requirements and worsening respiratory status, PCCM consulted due to high risk for deterioration and need for intubation. ABX broadened to Cefepime and Vancomycin, along with Azithromycin 11/08/20- patient with respiratory failure s/p ETT overnight. Vancomycin dcd today MRSA pcr negative. 11/09/20- Pt extubated to HFNC _0  AND 45%.  Patient with severe ARDS. I met with family and reviewed care plan. +Rhino/ENtero virus 11/10/20- patient improved, has been extubated to HFNC.  Tranferring to step down unit, signed out to [facility]. 11/12/20- FiO2 increased to 75%, dcd IV NS at 50/h, have aded lasix 20 bid, decreased prednisone to 40 once daily, stopped po vicodin.  Metaneb ordered BID with RT for recruitment.  Repeat CXR today 11/13/20: Transfferd back to ICU  Due to severe acute hypoxic respiratory failure 11/14/20: Failed HHFN and BiPAP requiring emergent re- intubation. s/p bedside Bronchoscopy 11/15/20: Patient  continues to be hypoxic after increasing FiO2 from 50% to 100% & PEEP from 10 to 12. ABG post these changes revealed a PF ratio of 100 & respiratory acidosis. Proned 6/5 severe hypoxia, ARDS, AIDS, +renal failure 6/7 severe hypoxia, ALI 6/8 severe hypoxia, ARDS initiated PRONING 6/9 SEVERE HYPOXIA  6/10: Severe hypoxia, remains critically ill 6/11: Persistent issues with hypoxemia, increased sedation needs due to ventilator asynchrony 6/12: Persistent issues with ventilator asynchrony, increasing FiO2 requirements, updated sister and have made it very clear that his prognosis is exceedingly poor 11/24/20-  patient on mechanical ventilation on FiO2 100%, there was previosly recurrent dyssynchrony requiring numerous doses of paralytics to reach stable vital signs.  Complex MICU course on >8 infusions simultaneously. Reviewed in detail with RN this morning.  Very unfortunate and extremely ill young patient at high risk of death.  We considered transfer for potential ECMO - [facility] is at capacity unable to accept any patients, [facility] is at capacity unable to take any patients, [facility] able to review case but patient not candidate.  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  Reivewed with Palliative care today.  11/25/20- patient remains on PRVC 100% severe pneumonia with opportunistic infections due to advanced AIDS.  Patient is not responding to therapy with very poor prognosis 11/26/20- patient is worse overnight, he is desaturated to 85% on Fio2 100%.  Reviewed plan with ID and will initiate AIDS therapy today. Met with sister today reviewed goals of care recommendation from me for DNR.  Family wants to keep full code.  11/27/20- Patient is on 100% FiO2, no events overnight, remains high  risk of cardiopulmonary arrest. 11/28/20- Patient on PRVC 100, 15peep, patient on maximal support on ventilator 100% FiO2 11/29/20-patient was  able to make over 1 L of urine today with Lasix however subsequently developed hypotension and is requiring low-dose Neo-Synephrine support.  Met with family today reviewed medical plan. 11/30/20- patient is not improving,, he continues to require maximum support from ventilator and despite diuresis has not had significant improvement.  Cultures:  6/7: Blood culture x2>> 6/3: PJP PCR:  Positive 6/3: Toxoplasma PCR: negative, IgG positive 6/3: Histoplasma urinary Ag: <0.5 6/3: BAL:  Normal respiratory flora 6/1: RPR: Reactive, titer 1:4, Ab (+) 6/1: Cryptococcus Ag: (-) 5/31: Hep B/C screen: (-) 5/31: Quantiferon-TB: (-) 5/31: Histoplasma Gal[place]an: <0.5 5/31: Fungitell: >500 5/28:Mycoplasma pneumonia ab >>negative 5/28 Tracheal aspirate: Normal respiratory flora 5/28 Respiratory panel: (+) Rhinovirus / enterovirus 5/26: SARS-CoV-2 PCR>> negative 5/26: Influenza PCR>> negative 5/26: Blood culture x2>>No growth thus far 5/26: Urine Cx>>no growth 5/27: MRSA PCR>> negative 5/27:Strep pneumo urinary antigen>>negative 5/27:Legionella urinary antigen>>negative 6/08: CMV DNA over 1 million copies   Antimicrobials:  5/26 Azithromycin >> 5/31 5/26 Cefepime >> 5/30 5/27 Vancomycin > 5/28 5/31 Bactrim >>  6/3 Penicillin G Benzathine >> 6/4 Anidulafungin >>  6/7 Zosyn>>6/9 6/10 ganciclovir>>   Antibiotics Given (last 72 hours)     Date/Time Action Medication Dose Rate   11/27/20 1310 New Bag/Given   clindamycin (CLEOCIN) IVPB 900 mg 900 mg 100 mL/hr   11/27/20 1728 Given   azithromycin (ZITHROMAX) 200 MG/5ML suspension 1,200 mg 1,200 mg    11/27/20 2148 Given   atovaquone (MEPRON) 750 MG/5ML suspension 1,500 mg 1,500 mg    11/27/20 2150 New Bag/Given   clindamycin (CLEOCIN) IVPB 900 mg 900 mg 100 mL/hr   11/27/20 2308 New Bag/Given   ganciclovir (CYTOVENE) 375 mg in sodium chloride 0.9 % 100 mL IVPB 375 mg 100 mL/hr   11/28/20 0614 New Bag/Given   clindamycin (CLEOCIN) IVPB 900 mg  900 mg 100 mL/hr   11/28/20 0906 Given   primaquine tablet 30 mg 30 mg    11/28/20 0906 Given   atovaquone (MEPRON) 750 MG/5ML suspension 1,500 mg 1,500 mg    11/28/20 0906 Given   dolutegravir (TIVICAY) tablet 50 mg 50 mg    11/28/20 0907 Given   emtricitabine-tenofovir AF (DESCOVY) 200-25 MG per tablet 1 tablet 1 tablet    11/28/20 1021 New Bag/Given   ganciclovir (CYTOVENE) 375 mg in sodium chloride 0.9 % 100 mL IVPB 375 mg 100 mL/hr   11/28/20 1417 New Bag/Given   clindamycin (CLEOCIN) IVPB 900 mg 900 mg 100 mL/hr   11/28/20 2138 New Bag/Given   clindamycin (CLEOCIN) IVPB 900 mg 900 mg 100 mL/hr   11/28/20 2257 Given   atovaquone (MEPRON) 750 MG/5ML suspension 1,500 mg 1,500 mg    11/29/20 0017 New Bag/Given   ganciclovir (CYTOVENE) 375 mg in sodium chloride 0.9 % 100 mL IVPB 375 mg 100 mL/hr   11/29/20 0540 New Bag/Given   clindamycin (CLEOCIN) IVPB 900 mg 900 mg 100 mL/hr   11/29/20 2778 Given   primaquine tablet 30 mg 30 mg    11/29/20 0923 Given   atovaquone (MEPRON) 750 MG/5ML suspension 1,500 mg 1,500 mg    11/29/20 0924 Given   emtricitabine-tenofovir AF (DESCOVY) 200-25 MG per tablet 1 tablet 1 tablet    11/29/20 0924 Given   dolutegravir (TIVICAY) tablet 50 mg 50 mg    11/29/20 1017 New Bag/Given   ganciclovir (CYTOVENE) 375 mg in sodium chloride  0.9 % 100 mL IVPB 375 mg 100 mL/hr   11/29/20 1352 New Bag/Given   clindamycin (CLEOCIN) IVPB 900 mg 900 mg 100 mL/hr   11/29/20 2210 Given   atovaquone (MEPRON) 750 MG/5ML suspension 1,500 mg 1,500 mg    11/29/20 2211 New Bag/Given   clindamycin (CLEOCIN) IVPB 900 mg 900 mg 100 mL/hr   11/29/20 2341 New Bag/Given   ganciclovir (CYTOVENE) 375 mg in sodium chloride 0.9 % 100 mL IVPB 375 mg 100 mL/hr   11/30/20 0603 New Bag/Given   clindamycin (CLEOCIN) IVPB 900 mg 900 mg 100 mL/hr   11/30/20 0929 Given   primaquine tablet 30 mg 30 mg    11/30/20 0930 Given   atovaquone (MEPRON) 750 MG/5ML suspension 1,500 mg 1,500 mg     11/30/20 0931 Given   emtricitabine-tenofovir AF (DESCOVY) 200-25 MG per tablet 1 tablet 1 tablet    11/30/20 1040 New Bag/Given   ganciclovir (CYTOVENE) 375 mg in sodium chloride 0.9 % 100 mL IVPB 375 mg 100 mL/hr   11/30/20 1220 Given  [tube feeds held 2.5 hours]   dolutegravir (TIVICAY) tablet 50 mg 50 mg       Scheduled Meds:  atovaquone  1,500 mg Per Tube BID   azithromycin  1,200 mg Per Tube Weekly   chlorhexidine gluconate (MEDLINE KIT)  15 mL Mouth Rinse BID   Chlorhexidine Gluconate Cloth  6 each Topical q morning   dolutegravir  50 mg Per Tube Daily   emtricitabine-tenofovir AF  1 tablet Per Tube Daily   enoxaparin (LOVENOX) injection  40 mg Subcutaneous Q24H   feeding supplement (PROSource TF)  45 mL Per Tube Daily   free water  20 mL Per Tube Q4H   insulin aspart  0-20 Units Subcutaneous Q4H   insulin glargine  7 Units Subcutaneous QHS   mouth rinse  15 mL Mouth Rinse 10 times per day   methylPREDNISolone (SOLU-MEDROL) injection  40 mg Intravenous Q24H   pantoprazole (PROTONIX) IV  40 mg Intravenous Daily   penicillin g benzathine (BICILLIN-LA) IM  2.4 Million Units Intramuscular Weekly   primaquine  30 mg Per Tube Daily   QUEtiapine  25 mg Per Tube QHS   Continuous Infusions:  sodium chloride 10 mL/hr at 11/30/20 0752   clindamycin (CLEOCIN) IV Stopped (11/30/20 5643)   feeding supplement (VITAL 1.5 CAL) 1,000 mL (11/30/20 0441)   ganciclovir 375 mg (11/30/20 1040)   HYDROmorphone 4 mg/hr (11/30/20 0752)   ketamine (KETALAR) Adult IV Infusion 2.5 mg/kg/hr (11/30/20 0927)   midazolam 10 mg/hr (11/30/20 1003)   phenylephrine (NEO-SYNEPHRINE) Adult infusion 10 mcg/min (11/30/20 0752)   PRN Meds:.acetaminophen, acetaminophen, hydrALAZINE, HYDROmorphone, metoprolol tartrate, midazolam, ondansetron **OR** ondansetron (ZOFRAN) IV, vecuronium  Chest x-ray today:      Objective   Blood pressure (!) 105/56, pulse (!) 132, temperature 99.5 F (37.5 C), resp. rate  (!) 29, height 5' 7.01" (1.702 m), weight 73 kg, SpO2 94 %.    Vent Mode: PRVC FiO2 (%):  [100 %] 100 % Set Rate:  [35 bmp] 35 bmp Vt Set:  [420 mL] 420 mL PEEP:  [15 cmH20] 15 cmH20 Plateau Pressure:  [32 cmH20] 32 cmH20   Intake/Output Summary (Last 24 hours) at 11/30/2020 1226 Last data filed at 11/30/2020 0752 Gross per 24 hour  Intake 2758.23 ml  Output 1250 ml  Net 1508.23 ml    Filed Weights   11/25/20 0405 11/27/20 0437 11/28/20 0440  Weight: 72.8 kg 72.9 kg 73 kg  REVIEW OF SYSTEMS  PATIENT IS UNABLE TO PROVIDE COMPLETE REVIEW OF SYSTEMS DUE TO SEVERE CRITICAL ILLNESS AND TOXIC METABOLIC ENCEPHALOPATHY   PHYSICAL EXAMINATION:  GENERAL: Critically ill-appearing male, laying in bed, intubated and sedated, asynchronous with the vent HEAD: Normocephalic, atraumatic.  EYES: Pupils equal, round, reactive to light.  No scleral icterus.  MOUTH: Moist mucosal membrane.  ET tube in place NECK: Supple, + JVD 4+ PULMONARY: Coarse breath sounds bilaterally, no wheezing, overbreathing the vent, even CARDIOVASCULAR: S1 and S2. Regular rate and rhythm. No murmurs, rubs, or gallops.  GASTROINTESTINAL: Soft, nontender, -distended. Positive bowel sounds.  MUSCULOSKELETAL: No swelling, clubbing, or edema.  NEUROLOGIC: sedated on mv GCS4T SKIN: Warm and diaphoretic.  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 leading to severe hypoxic resp failure intubated x 2 with severe b/l ling disease and ARDS/ALI transient renal failrue now ith elevated liver tests with liver dysfunction +PJP PNEUMONIA +TOXO +SYPHILIS +RHINOVIRUS +CMV  Assessment / Plan:   Acute Hypoxic and Hypercapnic Respiratory Failure due to PJP Pneumonia Severe ARDS -Full vent support,  implement lung protective strategies via ARDS Net Protocol -Reiterated ARDS protocol with VT 400 equals to 6 mL/kg, ABG to follow -Wean FiO2 and PEEP as tolerated maintain O2 sats greater than 90% -Follow intermittent chest x-ray and ABG as needed -Spontaneous breathing trials when respiratory parameters met -Continue VAP bundle -As needed bronchodilators -Continue antibiotics  Septic Shock -Continuous cardiac monitoring -Maintain MAP greater than 65 -IV fluids -Vasopressors as needed to maintain MAP goal -2D Echo 11/08/20 :LVEF 60-65%, normal RV systolic function -Persistent shock physiology bodes for very poor prognosis -Persistent requirement of vasopressors.  Severe Sepsis due to PJP Pneumonia Newly diagnosed AIDS Persistent Fever -Monitor fever curve -Trend WBCs -Follow cultures -ID following, antibiotics as per ID -Currently on Bactrim after CMV titer back. Bactrim better for PJP/Toxo -CD4 is 7 and VL 1.5 million copies -c/w HAART after 7-10 days of PCP treatment per ID-monitor for IRIS -gancyclovir for CMV > 1 million copies  Hyponatremia Volume overload -Monitor I&O's / urinary output -Follow BMP -Ensure adequate renal perfusion -Avoid nephrotoxic agents as able -Replace electrolytes as indicated -Repeat Lasix x1today  Mild Transaminitis -Negative viral hepatitis panel -Trend LFT's  Acute toxic metabolic encephalopathy, need for sedation -Goal RASS -2 to -3 given severe ARDS -Dilaudid & Versed, and Precedex as needed to maintain RASS goal -Avoid sedating meds as able -SAT as tolerates, not stable for same due to high FiO2 requirement -Provide supportive care  Hyperglycemia -CBG's -SSI -Follow ICU Hypo/Hyperglycemia protocol   Pt is critically ill.  Prognosis is guarded. High risk for cardiac arrest and death.  Best practice (right click and "Reselect all SmartList Selections" daily)  Diet:  Tube Feed Pain/Anxiety/Delirium protocol (if indicated): Yes  (RASS goal -2 to -3) VAP protocol (if indicated): Yes DVT prophylaxis: Lovenox GI prophylaxis: PPI Glucose control:  SSI Yes Central venous access:  Yes, and it is still needed Arterial line:  N/A Foley:  Yes, and it is still needed Mobility:  bed rest  PT consulted: N/A Last date of multidisciplinary goals of care discussion [11/23/20] we will place palliative care consultation Code Status:  full code Disposition: ICU    Labs   CBC: Recent Labs  Lab 11/26/20 0520 11/27/20 0441 11/28/20 0431 11/29/20 0657 11/30/20 0451  WBC 9.9 8.0 5.0 3.1* 2.7*  NEUTROABS 8.6* 7.0 4.3 2.5 2.0  HGB 10.6* 9.9* 8.6* 8.2* 8.4*  HCT 34.1* 31.2* 29.0* 28.4* 28.7*  MCV 88.8 88.6 92.9 95.6 96.3  PLT 198 187 196 188 212     Basic Metabolic Panel: Recent Labs  Lab 11/26/20 0520 11/27/20 0441 11/28/20 0431 11/29/20 0657 11/30/20 0451  NA 136 137 140 143 145  K 4.2 4.6 4.4 3.6 3.9  CL 87* 89* 93* 92* 95*  CO2 38* 40* 39* 46* 47*  GLUCOSE 157* 103* 151* 77 118*  BUN 17 23* 24* 21* 22*  CREATININE 0.55* 0.44* 0.45* 0.33* 0.52*  CALCIUM 8.5* 8.6* 8.5* 8.4* 8.2*  MG 2.0 2.1 2.1 2.2 2.2  PHOS 3.6 3.8 3.1 3.2 2.9    GFR: Estimated Creatinine Clearance: 113.6 mL/min (A) (by C-G formula based on SCr of 0.52 mg/dL (L)). Recent Labs  Lab 11/27/20 0441 11/28/20 0431 11/29/20 0657 11/30/20 0451  WBC 8.0 5.0 3.1* 2.7*     Liver Function Tests: Recent Labs  Lab 11/26/20 0520 11/27/20 0441 11/28/20 0431 11/29/20 0657 11/30/20 0451  AST 64* 60* 44* 79* 77*  ALT 254* 188* 130* 157* 133*  ALKPHOS 338* 460* 390* 515* 556*  BILITOT 0.9 1.2 0.9 0.9 0.8  PROT 6.3* 6.3* 5.8* 5.6* 6.0*  ALBUMIN 2.1* 2.0* 1.9* 2.0* 2.1*    No results for input(s): LIPASE, AMYLASE in the last 168 hours. No results for input(s): AMMONIA in the last 168 hours.  ABG    Component Value Date/Time   PHART 7.28 (L) 11/25/2020 0500   PCO2ART 72 (HH) 11/25/2020 0500   PO2ART 69 (L) 11/25/2020 0500   HCO3  33.8 (H) 11/25/2020 0500   ACIDBASEDEF 2.3 (H) 11/16/2020 0441   O2SAT 91.1 11/25/2020 0500      Coagulation Profile: No results for input(s): INR, PROTIME in the last 168 hours.  Cardiac Enzymes: No results for input(s): CKTOTAL, CKMB, CKMBINDEX, TROPONINI in the last 168 hours.   HbA1C: Hgb A1c MFr Bld  Date/Time Value Ref Range Status  11/12/2020 05:37 AM 5.9 (H) 4.8 - 5.6 % Final    Comment:    (NOTE)         Prediabetes: 5.7 - 6.4         Diabetes: >6.4         Glycemic control for adults with diabetes: <7.0     CBG: Recent Labs  Lab 11/29/20 1901 11/29/20 2310 11/30/20 0414 11/30/20 0747 11/30/20 1112  GLUCAP 125* 107* 95 169* 166*     Allergies No Known Allergies   Critical care provider statement:    Critical care time (minutes):  33   Critical care time was exclusive of:  Separately billable procedures and  treating other patients   Critical care was necessary to treat or prevent imminent or  life-threatening deterioration of the following conditions:  AIDS, ARDS, JPJ pna, CMV pneumnia, severe ARDS   Critical care was time spent personally by me on the following  activities:  Development of treatment plan with patient or surrogate,  discussions with consultants, evaluation of patient's response to  treatment, examination of patient, obtaining history from patient or  surrogate, ordering and performing treatments and interventions, ordering  and review of laboratory studies and re-evaluation of patient's condition   I assumed direction of critical care for this patient from another  provider in my specialty: no     *This note was dictated using voice recognition software/Dragon.  Despite  best efforts to proofread, errors can occur which can change the meaning.  Any change was purely unintentional.    Ottie Glazier, M.D.  Pulmonary & [place]

## 2020-11-30 NOTE — Progress Notes (Signed)
Responds to pain, Opens eyes when turned. Unable to follow commands. Pt now weaned off pressors. Becomes dyssynchronous with ventilator frequently and prn dilaudid or versed administered. HR 120s-130s MD aware. OG tube intact and pt tolerating tube feeds at this time. Arterial line intact with good wave forms. Foley intact with adequate output this shift. PRN tylenol for fevers.

## 2020-12-01 ENCOUNTER — Inpatient Hospital Stay: Payer: Medicaid Other

## 2020-12-01 DIAGNOSIS — B259 Cytomegaloviral disease, unspecified: Secondary | ICD-10-CM

## 2020-12-01 LAB — BLOOD GAS, ARTERIAL
Acid-Base Excess: 22.8 mmol/L — ABNORMAL HIGH (ref 0.0–2.0)
Acid-Base Excess: 21.5 mmol/L — ABNORMAL HIGH (ref 0.0–2.0)
Bicarbonate: 50.9 mmol/L — ABNORMAL HIGH (ref 20.0–28.0)
Bicarbonate: 50.5 mmol/L — ABNORMAL HIGH (ref 20.0–28.0)
FIO2: 1
FIO2: 1
MECHVT: 420 mL
MECHVT: 420 mL
O2 Saturation: 96.6 %
O2 Saturation: 91.7 %
PEEP: 15 cmH2O
PEEP: 15 cmH2O
Patient temperature: 37
Patient temperature: 37
RATE: 35 resp/min
RATE: 35 resp/min
pCO2 arterial: 86 mmHg (ref 32.0–48.0)
pCO2 arterial: 98 mmHg (ref 32.0–48.0)
pH, Arterial: 7.38 (ref 7.350–7.450)
pH, Arterial: 7.32 — ABNORMAL LOW (ref 7.350–7.450)
pO2, Arterial: 88 mmHg (ref 83.0–108.0)
pO2, Arterial: 68 mmHg — ABNORMAL LOW (ref 83.0–108.0)

## 2020-12-01 LAB — PROTIME-INR
INR: 1.1 (ref 0.8–1.2)
Prothrombin Time: 14.1 seconds (ref 11.4–15.2)

## 2020-12-01 LAB — COMPREHENSIVE METABOLIC PANEL
ALT: 91 U/L — ABNORMAL HIGH (ref 0–44)
AST: 47 U/L — ABNORMAL HIGH (ref 15–41)
Albumin: 2.1 g/dL — ABNORMAL LOW (ref 3.5–5.0)
Alkaline Phosphatase: 454 U/L — ABNORMAL HIGH (ref 38–126)
Anion gap: 6 (ref 5–15)
BUN: 20 mg/dL (ref 6–20)
CO2: 44 mmol/L — ABNORMAL HIGH (ref 22–32)
Calcium: 8.3 mg/dL — ABNORMAL LOW (ref 8.9–10.3)
Chloride: 95 mmol/L — ABNORMAL LOW (ref 98–111)
Creatinine, Ser: 0.43 mg/dL — ABNORMAL LOW (ref 0.61–1.24)
GFR, Estimated: >60 mL/min (ref >60)
Glucose, Bld: 163 mg/dL — ABNORMAL HIGH (ref 70–99)
Potassium: 4.1 mmol/L (ref 3.5–5.1)
Sodium: 145 mmol/L (ref 135–145)
Total Bilirubin: 0.7 mg/dL (ref 0.3–1.2)
Total Protein: 6 g/dL — ABNORMAL LOW (ref 6.5–8.1)

## 2020-12-01 LAB — CBC WITH DIFFERENTIAL/PLATELET
Abs Immature Granulocytes: 0.05 10*3/uL (ref 0.00–0.07)
Abs Immature Granulocytes: 0.07 10*3/uL (ref 0.00–0.07)
Basophils Absolute: 0 10*3/uL (ref 0.0–0.1)
Basophils Absolute: 0 10*3/uL (ref 0.0–0.1)
Basophils Relative: 0 %
Basophils Relative: 1 %
Eosinophils Absolute: 0.1 10*3/uL (ref 0.0–0.5)
Eosinophils Absolute: 0.1 10*3/uL (ref 0.0–0.5)
Eosinophils Relative: 4 %
Eosinophils Relative: 5 %
HCT: 26.6 % — ABNORMAL LOW (ref 39.0–52.0)
HCT: 27.5 % — ABNORMAL LOW (ref 39.0–52.0)
Hemoglobin: 7.4 g/dL — ABNORMAL LOW (ref 13.0–17.0)
Hemoglobin: 7.9 g/dL — ABNORMAL LOW (ref 13.0–17.0)
Immature Granulocytes: 2 %
Immature Granulocytes: 3 %
Lymphocytes Relative: 13 %
Lymphocytes Relative: 15 %
Lymphs Abs: 0.3 10*3/uL — ABNORMAL LOW (ref 0.7–4.0)
Lymphs Abs: 0.4 10*3/uL — ABNORMAL LOW (ref 0.7–4.0)
MCH: 28.1 pg (ref 26.0–34.0)
MCH: 28.6 pg (ref 26.0–34.0)
MCHC: 27.8 g/dL — ABNORMAL LOW (ref 30.0–36.0)
MCHC: 28.7 g/dL — ABNORMAL LOW (ref 30.0–36.0)
MCV: 101.1 fL — ABNORMAL HIGH (ref 80.0–100.0)
MCV: 99.6 fL (ref 80.0–100.0)
Monocytes Absolute: 0.2 10*3/uL (ref 0.1–1.0)
Monocytes Absolute: 0.3 10*3/uL (ref 0.1–1.0)
Monocytes Relative: 9 %
Monocytes Relative: 9 %
Neutro Abs: 1.8 10*3/uL (ref 1.7–7.7)
Neutro Abs: 2 10*3/uL (ref 1.7–7.7)
Neutrophils Relative %: 68 %
Neutrophils Relative %: 71 %
Platelets: 178 10*3/uL (ref 150–400)
Platelets: 178 10*3/uL (ref 150–400)
RBC: 2.63 MIL/uL — ABNORMAL LOW (ref 4.22–5.81)
RBC: 2.76 MIL/uL — ABNORMAL LOW (ref 4.22–5.81)
RDW: 17 % — ABNORMAL HIGH (ref 11.5–15.5)
RDW: 17.1 % — ABNORMAL HIGH (ref 11.5–15.5)
WBC: 2.7 10*3/uL — ABNORMAL LOW (ref 4.0–10.5)
WBC: 2.7 10*3/uL — ABNORMAL LOW (ref 4.0–10.5)
nRBC: 0.7 % — ABNORMAL HIGH (ref 0.0–0.2)
nRBC: 0.7 % — ABNORMAL HIGH (ref 0.0–0.2)

## 2020-12-01 LAB — GLUCOSE, CAPILLARY
Glucose-Capillary: 71 mg/dL (ref 70–99)
Glucose-Capillary: 187 mg/dL — ABNORMAL HIGH (ref 70–99)
Glucose-Capillary: 135 mg/dL — ABNORMAL HIGH (ref 70–99)
Glucose-Capillary: 169 mg/dL — ABNORMAL HIGH (ref 70–99)
Glucose-Capillary: 139 mg/dL — ABNORMAL HIGH (ref 70–99)
Glucose-Capillary: 157 mg/dL — ABNORMAL HIGH (ref 70–99)

## 2020-12-01 LAB — BRAIN NATRIURETIC PEPTIDE: B Natriuretic Peptide: 89.7 pg/mL (ref 0.0–100.0)

## 2020-12-01 LAB — PHOSPHORUS: Phosphorus: 2.8 mg/dL (ref 2.5–4.6)

## 2020-12-01 LAB — MAGNESIUM: Magnesium: 2.2 mg/dL (ref 1.7–2.4)

## 2020-12-01 MED ORDER — HEPARIN (PORCINE) 25000 UT/250ML-% IV SOLN
750.0000 [IU]/h | INTRAVENOUS | Status: DC
  Administered 2020-12-02: 750 [IU]/h via INTRAVENOUS
  Filled 2020-12-01: qty 250

## 2020-12-01 MED ORDER — SODIUM CHLORIDE 0.9 % IV BOLUS
500.0000 mL | Freq: Once | INTRAVENOUS | Status: AC
  Administered 2020-12-01: 500 mL via INTRAVENOUS

## 2020-12-01 MED ORDER — JUVEN PO PACK
1.0000 | PACK | Freq: Two times a day (BID) | ORAL | Status: DC
  Administered 2020-12-01: 1

## 2020-12-01 MED ORDER — SODIUM CHLORIDE 0.9 % IV SOLN: Freq: Once | INTRAVENOUS | Status: DC

## 2020-12-01 MED ORDER — NOREPINEPHRINE 4 MG/250ML-% IV SOLN
0.0000 ug/min | INTRAVENOUS | Status: DC
  Administered 2020-12-02: 30 ug/min via INTRAVENOUS
  Administered 2020-12-02: 5 ug/min via INTRAVENOUS
  Administered 2020-12-02: 20 ug/min via INTRAVENOUS
  Administered 2020-12-02: 40 ug/min via INTRAVENOUS
  Filled 2020-12-01 (×3): qty 250

## 2020-12-01 MED ORDER — MIDAZOLAM HCL 2 MG/2ML IJ SOLN: 2.0000 mg | Freq: Once | INTRAMUSCULAR | Status: DC

## 2020-12-01 NOTE — Progress Notes (Signed)
 Date of Admission:  10/15/2020   PCP treatment since 11/11/20 Ganciclovir since 11/21/20 HAART since 11/27/20     LDA Foley 11/15/20>> A line 11/24/20 Triple lumen 11/15/20 RT IJ  ID: Jordan Gibson is a 40 y.o. male Active Problems:   CAP (community acquired pneumonia)   Sepsis (HCC)   Acute respiratory failure with hypoxia (HCC)   Hyponatremia   Transaminitis   HIV positive (HCC)   AIDS (acquired immune deficiency syndrome) (HCC)   CMV (cytomegalovirus infection) (HCC)   Syphilis   PCP (pneumocystis jiroveci pneumonia) (HCC)   Acute respiratory distress syndrome (ARDS) (HCC)    Subjective: Intubated and sedated  Medications:   atovaquone  1,500 mg Per Tube BID   azithromycin  1,200 mg Per Tube Weekly   chlorhexidine gluconate (MEDLINE KIT)  15 mL Mouth Rinse BID   Chlorhexidine Gluconate Cloth  6 each Topical q morning   dolutegravir  50 mg Per Tube Daily   emtricitabine-tenofovir AF  1 tablet Per Tube Daily   enoxaparin (LOVENOX) injection  40 mg Subcutaneous Q24H   feeding supplement (PROSource TF)  45 mL Per Tube Daily   free water  20 mL Per Tube Q4H   insulin aspart  0-20 Units Subcutaneous Q4H   insulin glargine  7 Units Subcutaneous QHS   mouth rinse  15 mL Mouth Rinse 10 times per day   methylPREDNISolone (SOLU-MEDROL) injection  40 mg Intravenous Q24H   midazolam  2 mg Intravenous Once   pantoprazole (PROTONIX) IV  40 mg Intravenous Daily   penicillin g benzathine (BICILLIN-LA) IM  2.4 Million Units Intramuscular Weekly   primaquine  30 mg Per Tube Daily   QUEtiapine  25 mg Per Tube QHS    Objective: Vital signs in last 24 hours: Temp:  [97.88 F (36.6 C)-100.94 F (38.3 C)] 98.6 F (37 C) (06/20 1100) Pulse Rate:  [110-141] 122 (06/20 1100) Resp:  [17-35] 31 (06/20 1100) BP: (98-132)/(58-88) 108/63 (06/20 1100) SpO2:  [89 %-99 %] 93 % (06/20 1100) Arterial Line BP: (90-152)/(39-107) 115/58 (06/20 1100) FiO2 (%):  [100 %] 100 % (06/20  0835) Weight:  [73 kg] 73 kg (06/20 0500)  PHYSICAL EXAM: Intubated and sedated Lungs: b/l air entry- crepts- 100% oxygen- vent dependent - PEEP 15 Heart: Regular rate and rhythm, no murmur, rub or gallop. Abdomen: Soft, non-tender,not distended. Bowel sounds normal. No masses Extremities: atraumatic, no cyanosis. No edema. No clubbing Skin: No rashes or lesions. Or bruising Lymph: Cervical, supraclavicular normal. Neurologic: Grossly non-focal  Lab Results Recent Labs    11/30/20 0451 12/01/20 0553  WBC 2.7* 2.7*  HGB 8.4* 7.9*  HCT 28.7* 27.5*  NA 145 145  K 3.9 4.1  CL 95* 95*  CO2 47* 44*  BUN 22* 20  CREATININE 0.52* 0.43*   Liver Panel Recent Labs    11/30/20 0451 12/01/20 0553  PROT 6.0* 6.0*  ALBUMIN 2.1* 2.1*  AST 77* 47*  ALT 133* 91*  ALKPHOS 556* 454*  BILITOT 0.8 0.7   Microbiology: Infectious disease work-up BC- NG Fungal antibody-Neg Histoplasma antigen neg Toxo IgG 286 Cryptococcus antigen neg HIV RNA 1.5 million copies PCP PCR  POSITIVE in BAL Beta D glucan > 500 Repeat 143   RPR 1:4 TPA positive EBV PCR 4400- Not significant AFB blood culture sent on 11/19/20 AFB BAL 11/14/20 negative  CMV > 1 million Studies/Results: DG Chest Port 1 View  Result Date: 11/30/2020 CLINICAL DATA:  Endotracheal tube placement EXAM: PORTABLE CHEST   1 VIEW COMPARISON:  11/25/2020 FINDINGS: Endotracheal tube with tip measuring 4.1 cm above the carina. Enteric tube tip is off the field of view but below the left hemidiaphragm. Left central venous catheter tip overlies the junction of the SVC and brachiocephalic vein. No change in position of appliances. Heart size is normal. Diffuse airspace infiltrates throughout the lungs. No pleural effusions. No pneumothorax. Mediastinal contours appear intact. IMPRESSION: Appliances appear unchanged and in satisfactory position. Diffuse bilateral pulmonary infiltrates are unchanged. Electronically Signed   By: William  Stevens  M.D.   On: 11/30/2020 02:19     Assessment/Plan:  Pneumocystis  pneumonia causing acute hypoxic respiratory failure and ARDS.  He is on day 20 of PCP treatment.  Initially was started on IV Bactrim and because of worsening LFTs it was switched to IV clinda, Primacor and on p.o. atovaquone after nearly 14 days of treatment.  After 21 days we will discontinue IV and put him on secondary prophylaxis with oral Bactrim.  Acute hypoxic respiratory failure now has ARDS.  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  On ganciclovir day 11 Before he was intubated he did not have any eye symptoms or vision issues. Continue IV ganciclovir - may need upto 21 days and then can be switched to PO valganciclovir- Will repeat CMV DNA  Declining WBC likely due to ganciclovir.  May need G-CSF  Transaminitis much improved  Increase in alkaline phosphatase.  Could be due to medications. Ultrasound showed gallbladder distention with sludge but no cholecystitis Blood cultures for MAC has been sent.     AIDS.  Newly diagnosed.  Started Descovy plus Tivicay on 11/27/2020.  This was after 15 days of anti-PCP treatment and 6 days of CMV treatment.  The risk and benefit of starting HAART was considered.  Difficult case.  HAART can prevent other opportunistic infection but there is a risk for IRIS.  Observe closely  High Toxoplasma IgG likely reactivation.  No evidence of acute infection.  The treatment for PCP is also providing t coverage for toxo.  RPR 1 is 2-4 with positive tPA.  Getting 3 doses of weekly benzathine penicillin.  Fever recurring again.  Keep a close eye for immune reconstitution syndrome. Discussed the management with the sister and care team.  

## 2020-12-01 NOTE — Progress Notes (Signed)
eLink Physician-Brief Progress Note Patient Name: Neri Samek DOB: Jun 27, 1980 MRN: [id number]   Date of Service  12/01/2020  HPI/Events of Note  Notified of change in rhythm. Tachycardic 140s. Tachypneic overnight. Afebrile EKG with ST elevation V2-V5 VAC 35/420/100%/15 PEEP, peak pressure 45  eICU Interventions  CXR stat without pneumothorax CBC, CMP, PT INR stat Start heparin drip Bedside MD informed     Intervention Category Major Interventions: Arrhythmia - evaluation and management  Rosalie Gums [facility] [place] 12/01/2020, 11:42 PM

## 2020-12-01 NOTE — Progress Notes (Signed)
NAME:  Jordan Gibson, MRN:  [id number], DOB:  July 19, 1980, LOS: 25 ADMISSION DATE:  11/03/2020  Brief History of Present Illness   Jordan Gibson is a 40 y.o. Male with no significant past medical history who presented to [facility] ED on 11/07/20 with a 1 week history of shortness of breath, nonproductive cough, and fever. He was admitted to Acute Hypoxic Respiratory Failure and Sepsis in the setting of suspected Community Acquired Pneumonia.    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 Not on HAART. CD4 count 9, viral load 1.5 million. 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 Also tested positive for Rhinovirus/enterovirus, Syphilis, and Toxoplasma now complicated by ARDS requiring mechanical ventilation support.   Pertinent  Medical History  No significant past medical history   Significant Hospital Events: Including procedures, antibiotic start and stop dates in addition to other pertinent events  10/31/2020: Presented to ED, to be admitted to stepdown unit by hospitalist 11/07/2020: Increasing FiO2 requirements and worsening respiratory status, PCCM consulted due to high risk for deterioration and need for intubation. ABX broadened to Cefepime and Vancomycin, along with Azithromycin 11/08/20- patient with respiratory failure s/p ETT overnight. Vancomycin dcd today MRSA pcr negative. 11/09/20- Pt extubated to HFNC _0  AND 45%.  Patient with severe ARDS. I met with family and reviewed care plan. +Rhino/ENtero virus 11/10/20- patient improved, has been extubated to HFNC.  Tranferring to step down unit, signed out to [facility]. 11/12/20- FiO2 increased to 75%, dcd IV NS at 50/h, have aded lasix 20 bid, decreased prednisone to 40 once daily, stopped po vicodin.  Metaneb ordered BID with RT for recruitment.  Repeat CXR today 11/13/20: Transfferd back to ICU  Due to severe acute hypoxic respiratory failure 11/14/20: Failed HHFN and BiPAP requiring emergent re- intubation. s/p bedside Bronchoscopy 11/15/20: Patient  continues to be hypoxic after increasing FiO2 from 50% to 100% & PEEP from 10 to 12. ABG post these changes revealed a PF ratio of 100 & respiratory acidosis. Proned 6/5 severe hypoxia, ARDS, AIDS, +renal failure 6/7 severe hypoxia, ALI 6/8 severe hypoxia, ARDS initiated PRONING 6/9 SEVERE HYPOXIA  6/10: Severe hypoxia, remains critically ill 6/11: Persistent issues with hypoxemia, increased sedation needs due to ventilator asynchrony 6/12: Persistent issues with ventilator asynchrony, increasing FiO2 requirements, updated sister and have made it very clear that his prognosis is exceedingly poor 11/24/20-  patient on mechanical ventilation on FiO2 100%, there was previosly recurrent dyssynchrony requiring numerous doses of paralytics to reach stable vital signs.  Complex MICU course on >8 infusions simultaneously. Reviewed in detail with RN this morning.  Very unfortunate and extremely ill young patient at high risk of death.  We considered transfer for potential ECMO - [facility] is at capacity unable to accept any patients, [facility] is at capacity unable to take any patients, [facility] able to review case but patient not candidate.  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  Reivewed with Palliative care today.  11/25/20- patient remains on PRVC 100% severe pneumonia with opportunistic infections due to advanced AIDS.  Patient is not responding to therapy with very poor prognosis 11/26/20- patient is worse overnight, he is desaturated to 85% on Fio2 100%.  Reviewed plan with ID and will initiate AIDS therapy today. Met with sister today reviewed goals of care recommendation from me for DNR.  Family wants to keep full code.  11/27/20- Patient is on 100% FiO2, no events overnight, remains high  risk of cardiopulmonary arrest. 11/28/20- Patient on PRVC 100, 15peep, patient on maximal support on ventilator 100% FiO2 11/29/20-patient was  able to make over 1 L of urine today with Lasix however subsequently developed hypotension and is requiring low-dose Neo-Synephrine support.  Met with family today reviewed medical plan. 11/30/20- patient is not improving,, he continues to require maximum support from ventilator and despite diuresis has not had significant improvement.  12/01/20- Weaned off pressors, however remains critically ill and no improvement in vent support (remains on 100% FiO2 & 15 PEEP)  Cultures:  6/7: Blood culture x2>> 6/3: PJP PCR:  Positive 6/3: Toxoplasma PCR: negative, IgG positive 6/3: Histoplasma urinary Ag: <0.5 6/3: BAL:  Normal respiratory flora 6/1: RPR: Reactive, titer 1:4, Ab (+) 6/1: Cryptococcus Ag: (-) 5/31: Hep B/C screen: (-) 5/31: Quantiferon-TB: (-) 5/31: Histoplasma Gal[place]an: <0.5 5/31: Fungitell: >500 5/28:Mycoplasma pneumonia ab >>negative 5/28 Tracheal aspirate: Normal respiratory flora 5/28 Respiratory panel: (+) Rhinovirus / enterovirus 5/26: SARS-CoV-2 PCR>> negative 5/26: Influenza PCR>> negative 5/26: Blood culture x2>>No growth thus far 5/26: Urine Cx>>no growth 5/27: MRSA PCR>> negative 5/27:Strep pneumo urinary antigen>>negative 5/27:Legionella urinary antigen>>negative 6/08: CMV DNA over 1 million copies   Antimicrobials:  5/26 Azithromycin >> 5/31 5/26 Cefepime >> 5/30 5/27 Vancomycin > 5/28 5/31 Bactrim >>  6/3 Penicillin G Benzathine >> 6/4 Anidulafungin >>  6/7 Zosyn>>6/9 6/10 ganciclovir>>   Antibiotics Given (last 72 hours)     Date/Time Action Medication Dose Rate   11/28/20 0906 Given   primaquine tablet 30 mg 30 mg    11/28/20 0906 Given   atovaquone (MEPRON) 750 MG/5ML suspension 1,500 mg 1,500 mg    11/28/20 0906 Given   dolutegravir (TIVICAY) tablet 50 mg 50 mg    11/28/20 0907 Given   emtricitabine-tenofovir AF (DESCOVY) 200-25 MG per tablet 1 tablet 1 tablet    11/28/20 1021 New Bag/Given   ganciclovir (CYTOVENE) 375 mg in sodium chloride 0.9 %  100 mL IVPB 375 mg 100 mL/hr   11/28/20 1417 New Bag/Given   clindamycin (CLEOCIN) IVPB 900 mg 900 mg 100 mL/hr   11/28/20 2138 New Bag/Given   clindamycin (CLEOCIN) IVPB 900 mg 900 mg 100 mL/hr   11/28/20 2257 Given   atovaquone (MEPRON) 750 MG/5ML suspension 1,500 mg 1,500 mg    11/29/20 0017 New Bag/Given   ganciclovir (CYTOVENE) 375 mg in sodium chloride 0.9 % 100 mL IVPB 375 mg 100 mL/hr   11/29/20 0540 New Bag/Given   clindamycin (CLEOCIN) IVPB 900 mg 900 mg 100 mL/hr   11/29/20 4196 Given   primaquine tablet 30 mg 30 mg    11/29/20 0923 Given   atovaquone (MEPRON) 750 MG/5ML suspension 1,500 mg 1,500 mg    11/29/20 0924 Given   emtricitabine-tenofovir AF (DESCOVY) 200-25 MG per tablet 1 tablet 1 tablet    11/29/20 0924 Given   dolutegravir (TIVICAY) tablet 50 mg 50 mg    11/29/20 1017 New Bag/Given   ganciclovir (CYTOVENE) 375 mg in sodium chloride 0.9 % 100 mL IVPB 375 mg 100 mL/hr   11/29/20 1352 New Bag/Given   clindamycin (CLEOCIN) IVPB 900 mg 900 mg 100 mL/hr   11/29/20 2210 Given   atovaquone (MEPRON) 750 MG/5ML suspension 1,500 mg 1,500 mg    11/29/20 2211 New Bag/Given   clindamycin (CLEOCIN) IVPB 900 mg 900 mg 100 mL/hr   11/29/20 2341 New Bag/Given   ganciclovir (CYTOVENE) 375 mg in sodium chloride 0.9 % 100 mL IVPB 375 mg 100 mL/hr   11/30/20  0603 New Bag/Given   clindamycin (CLEOCIN) IVPB 900 mg 900 mg 100 mL/hr   11/30/20 0929 Given   primaquine tablet 30 mg 30 mg    11/30/20 0930 Given   atovaquone (MEPRON) 750 MG/5ML suspension 1,500 mg 1,500 mg    11/30/20 0931 Given   emtricitabine-tenofovir AF (DESCOVY) 200-25 MG per tablet 1 tablet 1 tablet    11/30/20 1040 New Bag/Given   ganciclovir (CYTOVENE) 375 mg in sodium chloride 0.9 % 100 mL IVPB 375 mg 100 mL/hr   11/30/20 1220 Given  [tube feeds held 2.5 hours]   dolutegravir (TIVICAY) tablet 50 mg 50 mg    11/30/20 1443 New Bag/Given   clindamycin (CLEOCIN) IVPB 900 mg 900 mg 100 mL/hr   11/30/20 2240  Given   atovaquone (MEPRON) 750 MG/5ML suspension 1,500 mg 1,500 mg    11/30/20 2242 New Bag/Given   clindamycin (CLEOCIN) IVPB 900 mg 900 mg 100 mL/hr   11/30/20 2356 New Bag/Given   ganciclovir (CYTOVENE) 375 mg in sodium chloride 0.9 % 100 mL IVPB 375 mg 100 mL/hr   12/01/20 0543 New Bag/Given   clindamycin (CLEOCIN) IVPB 900 mg 900 mg 100 mL/hr      Scheduled Meds:  atovaquone  1,500 mg Per Tube BID   azithromycin  1,200 mg Per Tube Weekly   chlorhexidine gluconate (MEDLINE KIT)  15 mL Mouth Rinse BID   Chlorhexidine Gluconate Cloth  6 each Topical q morning   dolutegravir  50 mg Per Tube Daily   emtricitabine-tenofovir AF  1 tablet Per Tube Daily   enoxaparin (LOVENOX) injection  40 mg Subcutaneous Q24H   feeding supplement (PROSource TF)  45 mL Per Tube Daily   free water  20 mL Per Tube Q4H   insulin aspart  0-20 Units Subcutaneous Q4H   insulin glargine  7 Units Subcutaneous QHS   mouth rinse  15 mL Mouth Rinse 10 times per day   methylPREDNISolone (SOLU-MEDROL) injection  40 mg Intravenous Q24H   midazolam  2 mg Intravenous Once   pantoprazole (PROTONIX) IV  40 mg Intravenous Daily   penicillin g benzathine (BICILLIN-LA) IM  2.4 Million Units Intramuscular Weekly   primaquine  30 mg Per Tube Daily   QUEtiapine  25 mg Per Tube QHS   Continuous Infusions:  sodium chloride 10 mL/hr at 12/01/20 0743   clindamycin (CLEOCIN) IV Stopped (12/01/20 3086)   feeding supplement (VITAL 1.5 CAL) 1,000 mL (12/01/20 0227)   ganciclovir Stopped (12/01/20 0101)   HYDROmorphone 4 mg/hr (12/01/20 0743)   ketamine (KETALAR) Adult IV Infusion 2.5 mg/kg/hr (12/01/20 0743)   midazolam 10 mg/hr (12/01/20 0743)   phenylephrine (NEO-SYNEPHRINE) Adult infusion Stopped (11/30/20 1214)   PRN Meds:.acetaminophen, acetaminophen, hydrALAZINE, HYDROmorphone, metoprolol tartrate, midazolam, ondansetron **OR** ondansetron (ZOFRAN) IV, vecuronium   Objective   Blood pressure 111/65, pulse (!) 129,  temperature 99.14 F (37.3 C), temperature source Esophageal, resp. rate (!) 35, height 5' 7.01" (1.702 m), weight 73 kg, SpO2 96 %.    Vent Mode: PRVC FiO2 (%):  [100 %] 100 % Set Rate:  [35 bmp] 35 bmp Vt Set:  [420 mL] 420 mL PEEP:  [15 cmH20] 15 cmH20 Plateau Pressure:  [34 cmH20-42 cmH20] 42 cmH20   Intake/Output Summary (Last 24 hours) at 12/01/2020 0829 Last data filed at 12/01/2020 0743 Gross per 24 hour  Intake 3782.84 ml  Output 1575 ml  Net 2207.84 ml    Filed Weights   11/27/20 0437 11/28/20 0440 12/01/20 0500  Weight:  72.9 kg 73 kg 73 kg    REVIEW OF SYSTEMS  PATIENT IS UNABLE TO PROVIDE COMPLETE REVIEW OF SYSTEMS DUE TO SEVERE CRITICAL ILLNESS AND TOXIC METABOLIC ENCEPHALOPATHY   PHYSICAL EXAMINATION:  GENERAL: Critically ill-appearing male, laying in bed, intubated and sedated, synchronous with the vent HEAD: Normocephalic, atraumatic.  EYES: Pupils equal, round, reactive to light.  No scleral icterus.  MOUTH: Moist mucosal membrane.  ET tube in place NECK: Supple, + JVD PULMONARY: Coarse breath sounds bilaterally, no wheezing, currently synchronous with vent,  even CARDIOVASCULAR: Tachycardia, regular rhythm, s1s2, No murmurs, rubs, or gallops.  GASTROINTESTINAL: Soft, nontender, -distended, no guarding or rebound tenderness, Positive bowel sounds.  MUSCULOSKELETAL: Normal bulk and tone, no deformities, No swelling, clubbing, or edema.  NEUROLOGIC: Heavily sedated, does not follow commands, pupils PERRL SKIN: Warm and diaphoretic.  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  Patient leading to severe hypoxic resp failure intubated x 2 with severe b/l ling disease and ARDS/ALI transient renal failrue now ith elevated liver tests with liver dysfunction +PJP PNEUMONIA +TOXO +SYPHILIS +RHINOVIRUS +CMV  Assessment / Plan:   Acute Hypoxic and Hypercapnic Respiratory Failure due to PJP Pneumonia Severe ARDS -Full vent support, implement lung protective strategies via ARDS Net Protocol -Reiterated ARDS protocol with VT 400 equals to 6 mL/kg, ABG to follow -Wean FiO2 and PEEP as tolerated maintain O2 sats greater than 90% -Follow intermittent chest x-ray and ABG as needed -Spontaneous breathing trials when respiratory parameters met -Continue VAP bundle -As needed bronchodilators -Continue antibiotics  Septic Shock -Continuous cardiac monitoring -Maintain MAP greater than 65 -Vasopressors as needed to maintain MAP goal ~ currently weaned off -2D Echo 11/08/20 :LVEF 60-65%, normal RV systolic function  Severe Sepsis due to PJP Pneumonia Newly diagnosed AIDS Persistent Fever -Monitor fever curve -Trend WBCs -Follow cultures -ID following, antibiotics as per ID -Currently on Clindamycin, Atovaquone, Primaquine (instead of bactrim- due to cholestatic/hepatocellular mixed pattern) as per ID -CD4 is 7 and VL 1.5 million copies -On HAART ~ Descovy + [place]ay -gancyclovir for CMV > 1 million copies  Hyponatremia in setting of volume overload>>resolved -Monitor I&O's / urinary output -Follow BMP -Ensure adequate renal perfusion -Avoid nephrotoxic agents as able -Replace electrolytes as indicated  Mild Transaminitis -Negative viral hepatitis panel -Trend LFT's -Abdominal US with gallbladder sludge without gallstones  Anemia without s/sx of overt bleeding -Monitor for S/Sx of bleeding -Trend CBC -Lovenox for VTE Prophylaxis  -Transfuse for Hgb <7  Acute toxic metabolic encephalopathy, need for sedation -Goal RASS -2 to -3 given severe ARDS -Dilaudid &  Versed, and Precedex as needed to maintain RASS goal -Avoid sedating meds as able -SAT as tolerates, not stable for same due to high FiO2 requirement -Provide supportive care  Hyperglycemia -CBG's -SSI -Follow ICU Hypo/Hyperglycemia protocol   Pt is critically ill.  Prognosis is guarded. High risk for cardiac arrest and death.  Best practice (right click and "Reselect all SmartList Selections" daily)  Diet:  Tube Feed Pain/Anxiety/Delirium protocol (if indicated): Yes (RASS goal -2 to -3) VAP protocol (if indicated): Yes DVT prophylaxis: Lovenox GI prophylaxis: PPI Glucose control:  SSI Yes Central venous access:  Yes, and  it is still needed Arterial line:  yes, and is still needed Foley:  Yes, and it is still needed Mobility:  bed rest  PT consulted: N/A Last date of multidisciplinary goals of care discussion [12/01/20]  Code Status:  full code Disposition: ICU    Labs   CBC: Recent Labs  Lab 11/27/20 0441 11/28/20 0431 11/29/20 0657 11/30/20 0451 12/01/20 0553  WBC 8.0 5.0 3.1* 2.7* 2.7*  NEUTROABS 7.0 4.3 2.5 2.0 1.8  HGB 9.9* 8.6* 8.2* 8.4* 7.9*  HCT 31.2* 29.0* 28.4* 28.7* 27.5*  MCV 88.6 92.9 95.6 96.3 99.6  PLT 187 196 188 212 178     Basic Metabolic Panel: Recent Labs  Lab 11/27/20 0441 11/28/20 0431 11/29/20 0657 11/30/20 0451 12/01/20 0553  NA 137 140 143 145 145  K 4.6 4.4 3.6 3.9 4.1  CL 89* 93* 92* 95* 95*  CO2 40* 39* 46* 47* 44*  GLUCOSE 103* 151* 77 118* 163*  BUN 23* 24* 21* 22* 20  CREATININE 0.44* 0.45* 0.33* 0.52* 0.43*  CALCIUM 8.6* 8.5* 8.4* 8.2* 8.3*  MG 2.1 2.1 2.2 2.2 2.2  PHOS 3.8 3.1 3.2 2.9 2.8    GFR: Estimated Creatinine Clearance: 113.6 mL/min (A) (by C-G formula based on SCr of 0.43 mg/dL (L)). Recent Labs  Lab 11/28/20 0431 11/29/20 0657 11/30/20 0451 12/01/20 0553  WBC 5.0 3.1* 2.7* 2.7*     Liver Function Tests: Recent Labs  Lab 11/27/20 0441 11/28/20 0431 11/29/20 0657 11/30/20 0451  12/01/20 0553  AST 60* 44* 79* 77* 47*  ALT 188* 130* 157* 133* 91*  ALKPHOS 460* 390* 515* 556* 454*  BILITOT 1.2 0.9 0.9 0.8 0.7  PROT 6.3* 5.8* 5.6* 6.0* 6.0*  ALBUMIN 2.0* 1.9* 2.0* 2.1* 2.1*    No results for input(s): LIPASE, AMYLASE in the last 168 hours. No results for input(s): AMMONIA in the last 168 hours.  ABG    Component Value Date/Time   PHART 7.38 12/01/2020 0500   PCO2ART 86 (HH) 12/01/2020 0500   PO2ART 88 12/01/2020 0500   HCO3 50.9 (H) 12/01/2020 0500   ACIDBASEDEF 2.3 (H) 11/16/2020 0441   O2SAT 96.6 12/01/2020 0500      Coagulation Profile: No results for input(s): INR, PROTIME in the last 168 hours.  Cardiac Enzymes: No results for input(s): CKTOTAL, CKMB, CKMBINDEX, TROPONINI in the last 168 hours.   HbA1C: Hgb A1c MFr Bld  Date/Time Value Ref Range Status  11/12/2020 05:37 AM 5.9 (H) 4.8 - 5.6 % Final    Comment:    (NOTE)         Prediabetes: 5.7 - 6.4         Diabetes: >6.4         Glycemic control for adults with diabetes: <7.0     CBG: Recent Labs  Lab 11/30/20 1605 11/30/20 1930 11/30/20 2354 12/01/20 0407 12/01/20 0714  GLUCAP 133* 155* 119* 71 187*     Allergies No Known Allergies    Critical Care Time: 35 minutes    Darel Hong, AGACNP-BC [facility] Prefer epic messenger for cross cover needs If after hours, please call E-link

## 2020-12-01 NOTE — Progress Notes (Signed)
Pt responds to pain, unable to follow commands. Becomes dyssynchronous and stacks breaths at times, prn versed administered. Foley intact. OG intact and pt tolerating tube feeds. Unable to perform WUA at this time due to dyssynchrony, on 100% FiO2.

## 2020-12-02 DIAGNOSIS — R6521 Severe sepsis with septic shock: Secondary | ICD-10-CM

## 2020-12-02 LAB — CBC WITH DIFFERENTIAL/PLATELET
Abs Immature Granulocytes: 0.19 10*3/uL — ABNORMAL HIGH (ref 0.00–0.07)
Basophils Absolute: 0 10*3/uL (ref 0.0–0.1)
Basophils Relative: 1 %
Eosinophils Absolute: 0.1 10*3/uL (ref 0.0–0.5)
Eosinophils Relative: 3 %
HCT: 29.1 % — ABNORMAL LOW (ref 39.0–52.0)
Hemoglobin: 8.1 g/dL — ABNORMAL LOW (ref 13.0–17.0)
Immature Granulocytes: 4 %
Lymphocytes Relative: 12 %
Lymphs Abs: 0.5 10*3/uL — ABNORMAL LOW (ref 0.7–4.0)
MCH: 28.1 pg (ref 26.0–34.0)
MCHC: 27.8 g/dL — ABNORMAL LOW (ref 30.0–36.0)
MCV: 101 fL — ABNORMAL HIGH (ref 80.0–100.0)
Monocytes Absolute: 0.4 10*3/uL (ref 0.1–1.0)
Monocytes Relative: 9 %
Neutro Abs: 3.2 10*3/uL (ref 1.7–7.7)
Neutrophils Relative %: 71 %
Platelets: 253 10*3/uL (ref 150–400)
RBC: 2.88 MIL/uL — ABNORMAL LOW (ref 4.22–5.81)
RDW: 17.2 % — ABNORMAL HIGH (ref 11.5–15.5)
WBC: 4.5 10*3/uL (ref 4.0–10.5)
nRBC: 0.7 % — ABNORMAL HIGH (ref 0.0–0.2)

## 2020-12-02 LAB — BASIC METABOLIC PANEL
Anion gap: 3 — ABNORMAL LOW (ref 5–15)
Anion gap: 4 — ABNORMAL LOW (ref 5–15)
BUN: 20 mg/dL (ref 6–20)
BUN: 21 mg/dL — ABNORMAL HIGH (ref 6–20)
CO2: 46 mmol/L — ABNORMAL HIGH (ref 22–32)
CO2: 46 mmol/L — ABNORMAL HIGH (ref 22–32)
Calcium: 8.2 mg/dL — ABNORMAL LOW (ref 8.9–10.3)
Calcium: 8.4 mg/dL — ABNORMAL LOW (ref 8.9–10.3)
Chloride: 94 mmol/L — ABNORMAL LOW (ref 98–111)
Chloride: 94 mmol/L — ABNORMAL LOW (ref 98–111)
Creatinine, Ser: 0.41 mg/dL — ABNORMAL LOW (ref 0.61–1.24)
Creatinine, Ser: 0.46 mg/dL — ABNORMAL LOW (ref 0.61–1.24)
GFR, Estimated: >60 mL/min (ref >60)
GFR, Estimated: >60 mL/min (ref >60)
Glucose, Bld: 169 mg/dL — ABNORMAL HIGH (ref 70–99)
Glucose, Bld: 95 mg/dL (ref 70–99)
Potassium: 4.3 mmol/L (ref 3.5–5.1)
Potassium: 4.6 mmol/L (ref 3.5–5.1)
Sodium: 143 mmol/L (ref 135–145)
Sodium: 144 mmol/L (ref 135–145)

## 2020-12-02 LAB — PHOSPHORUS: Phosphorus: 3 mg/dL (ref 2.5–4.6)

## 2020-12-02 LAB — GLUCOSE, CAPILLARY
Glucose-Capillary: 97 mg/dL (ref 70–99)
Glucose-Capillary: 101 mg/dL — ABNORMAL HIGH (ref 70–99)
Glucose-Capillary: 96 mg/dL (ref 70–99)
Glucose-Capillary: 112 mg/dL — ABNORMAL HIGH (ref 70–99)
Glucose-Capillary: 54 mg/dL — ABNORMAL LOW (ref 70–99)
Glucose-Capillary: 57 mg/dL — ABNORMAL LOW (ref 70–99)

## 2020-12-02 LAB — TROPONIN I (HIGH SENSITIVITY)
Troponin I (High Sensitivity): 121 ng/L (ref <18)
Troponin I (High Sensitivity): 28 ng/L — ABNORMAL HIGH (ref <18)
Troponin I (High Sensitivity): 51 ng/L — ABNORMAL HIGH (ref <18)
Troponin I (High Sensitivity): 73 ng/L — ABNORMAL HIGH (ref <18)

## 2020-12-02 LAB — MAGNESIUM: Magnesium: 2.3 mg/dL (ref 1.7–2.4)

## 2020-12-02 LAB — HEPARIN LEVEL (UNFRACTIONATED): Heparin Unfractionated: 0.32 IU/mL (ref 0.30–0.70)

## 2020-12-02 MED ORDER — NOREPINEPHRINE 4 MG/250ML-% IV SOLN
INTRAVENOUS | Status: AC
  Filled 2020-12-02: qty 250

## 2020-12-02 MED ORDER — SODIUM CHLORIDE 0.9 % IV SOLN
5.0000 mg/kg | Freq: Two times a day (BID) | INTRAVENOUS | Status: DC
  Filled 2020-12-02 (×2): qty 7.5

## 2020-12-02 MED ORDER — DEXTROSE 50 % IV SOLN
25.0000 g | INTRAVENOUS | Status: AC
  Administered 2020-12-02: 25 g via INTRAVENOUS

## 2020-12-02 MED ORDER — NOREPINEPHRINE 16 MG/250ML-% IV SOLN
0.0000 ug/min | INTRAVENOUS | Status: DC
  Administered 2020-12-02: 40 ug/min via INTRAVENOUS
  Filled 2020-12-02: qty 250

## 2020-12-02 MED ORDER — METHYLPREDNISOLONE SODIUM SUCC 40 MG IJ SOLR: 40.0000 mg | Freq: Two times a day (BID) | INTRAMUSCULAR | Status: DC

## 2020-12-02 MED ORDER — SODIUM CHLORIDE 0.9 % IV SOLN
1.0000 g | Freq: Three times a day (TID) | INTRAVENOUS | Status: DC
  Administered 2020-12-02 (×3): 1 g via INTRAVENOUS
  Filled 2020-12-02 (×6): qty 1

## 2020-12-02 MED ORDER — VASOPRESSIN 20 UNITS/100 ML INFUSION FOR SHOCK
0.0000 [IU]/min | INTRAVENOUS | Status: DC
Start: 2020-12-02 — End: 2020-12-03
  Administered 2020-12-02: 0.03 [IU]/min via INTRAVENOUS
  Filled 2020-12-02: qty 100

## 2020-12-05 LAB — CMV DNA, QUANTITATIVE, PCR
CMV DNA Quant: >1000000 IU/mL
Log10 CMV Qn DNA Pl: UNDETERMINED log10 IU/mL

## 2020-12-05 LAB — FUNGITELL, SERUM: Fungitell Result: 87 pg/mL — ABNORMAL HIGH (ref <80)

## 2020-12-06 LAB — CULTURE, FUNGUS WITHOUT SMEAR

## 2020-12-12 NOTE — Progress Notes (Signed)
   11/27/2020 0200  Clinical Encounter Type  Visited With Family;Health care provider  Visit Type Spiritual support  Referral From Nurse  Consult/Referral To Chaplain  Spiritual Encounters  Spiritual Needs Prayer;Emotional  Stress Factors  Family Stress Factors Major life changes;Health changes   This chaplain received a call from the ICU charge nurse requesting support for the patient's family. Upon arrival, the patient's sister and brother-in-law were standing at the bedside in prayer. This chaplain remained outside of the patient's room until his sister emerged from the room. We engaged in brief conversation and she welcomed me to pray for her brother/the patient. The patient's sister confirmed that they are [place] and believe in he power of prayer. She also confirmed that they have additional family/loved ones who are unable to be present, as well as support from their pastor and faith community. They did not wish to contact their pastor at this time.   Once at the bedside, I engaged Marketing executive to introduce myself and offer prayer for the patient and their family. They expressed gratitude for the prayer and support provided. No additional needs at this time. Spiritual support remains available.  Jordan Gibson, Chaplain

## 2020-12-12 NOTE — Progress Notes (Addendum)
PCCM Interval Note  Called to bedside for desaturations and hypotension. E-link was also notified regarding ST elevation and starting heparin gtt. Neo and Levophed gtt started. Saturations continued to decline to the 50s. ETT with no secretions and easy to BVM. Suspect his poor oxygenation is related to poor perfusion in setting of cardiac event. Unfortunately he is not a candidate for evaluation/intervention with his hemodynamic instability. Continue with aggressive medical management with vasopressors, anticoagulation and vent support. Meropenem also added.   Attempted to perform bedside ultrasound however worsening hypotension and desaturation so procedure was aborted. Briefly, hyperdynamic movement. Unable to assess EF. No pericardial effusion.  RN contacted family regarding patient's critical status and family is on the way. Will need Spanish interpretor on their arrival. Prognosis is grim.  On family arrival, GOC discussed with sister including futility of CPR and meaningful recovery in the event patient's heart stops. Addressed questions and concerns. Family wishes to remain full code  Critical Care Time: 28 min  Mechele Collin, M.D. [facility] 11/28/2020 12:37 AM   See Amion for personal pager For hours between 7 PM to 7 AM, please call Elink for urgent questions

## 2020-12-12 NOTE — Death Summary Note (Addendum)
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) Secondary Diagnoses (including complications and co-morbidities):  Active Problems:   CAP (community acquired pneumonia)   Sepsis (HCC)   Acute respiratory failure with hypoxia (HCC)   Hyponatremia   Transaminitis   HIV positive (HCC)   AIDS (acquired immune deficiency syndrome) (HCC)   CMV (cytomegalovirus infection) (HCC)   Syphilis   PCP (pneumocystis jiroveci pneumonia) (HCC)   Acute respiratory distress syndrome (ARDS) [facility])   Brief Hospital Course (including significant findings, care, treatment, and services provided and events leading to death)  Jordan Gibson is a 40 y.o. year old male who presented to [facility] ER with 1 week history of shortness of breath, nonproductive cough, and fever.  Pt admitted to the stepdown unit on 11/07/2020 with acute hypoxic respiratory failure and sepsis suspected secondary to Community Acquired Pneumonia.  He was subsequently newly diagnosed with HIV/AIDS during hospitalization. On 11/07/2020 pt required transfer to ICU due to worsening hypoxia in the setting of severe ARDS requiring emergent mechanical intubation and broadening of abx coverage.  On 11/09/2020 pt successfully extubated to [facility]NC and level of care changed to stepdown.  Despite aggressive treatment respiratory status continued to decline and pt required reintubation and level of care changed to ICU on 11/14/2020.  During hospitalization lab results revealed pt positive for pneumocystis jiroveci pneumonia, toxoplasma, syphilis, rhinovirus, and cytomegalovirus.  Pt received aggressive antibiotic treatment  and antiviral medications per infectious disease recommendations.  HAART therapy initiated 11/27/2020 per ID.  However, despite aggressive treatment pt continued to decline with severe hypoxia and hypotension. On the afternoon of Dec 05, 2022 his HR and sats declined. The medical team had a conversation with the family outside the room via in person medical interpreter. We recommended DNR status with no CPR and they agreed to this.He subsequently expired on 12-04-20 at 1735 with family present at bedside.   Pertinent Labs and Studies  Significant Diagnostic Studies DG Chest 1 View  Result Date: 11/15/2020 CLINICAL DATA:  Status post central line placement. EXAM: CHEST  1 VIEW COMPARISON:  Chest radiograph dated 11/14/2020. FINDINGS: There has been interval placement of a left internal jugular central venous catheter with tip overlying the confluence of the brachiocephalic vein and superior vena cava. An endotracheal tube terminates in the midthoracic trachea. An enteric tube terminates in the stomach. The heart size is normal. Severe diffuse bilateral airspace opacities appear increased. There is no pleural effusion or pneumothorax. IMPRESSION: 1. Left internal jugular central venous catheter tip overlying the confluence of the brachiocephalic vein and superior vena cava. No pneumothorax. 2. Increased severe diffuse bilateral airspace opacities, likely representing pneumonia. Electronically Signed   By: Romona Curls M.D.   On: 11/15/2020 20:48   DG Chest 2 View  Result Date: 11/12/2020 CLINICAL DATA:  Shortness of breath EXAM: CHEST - 2 VIEW COMPARISON:  11/09/2020 FINDINGS: Endotracheal tube and orogastric tube have been removed. Lung volumes are diminished following extubation. Widespread diffuse pulmonary density persists which could be due to a combination of pneumonia and edema. No visible effusion. IMPRESSION: Removal of endotracheal tube and orogastric tube. Lung volumes diminished following extubation.  Widespread pulmonary density persists which could  be due to a combination of pneumonia and edema. Electronically Signed   By: Paulina Fusi M.D.   On: 11/12/2020 14:27   DG Abd 1 View  Result Date: 11/14/2020 CLINICAL DATA:  Orogastric tube placement EXAM: ABDOMEN - 1 VIEW COMPARISON:  Portable exam 1234 hours compared to by 01/31/2021 FINDINGS: Tip of orogastric tube projects over gastric antrum. Nonobstructive bowel gas pattern. No bowel dilatation or bowel wall thickening. IMPRESSION: Tip of orogastric tube projects over gastric antrum. Electronically Signed   By: Ulyses Southward M.D.   On: 11/14/2020 13:02   DG Abd 1 View  Result Date: 11/08/2020 CLINICAL DATA:  ET and OG placement EXAM: ABDOMEN - 1 VIEW COMPARISON:  None. FINDINGS: Bibasilar airspace disease. Esophageal tube tip overlies the distal stomach. Moderate gastric distension. Multiple dilated air-filled loops of small bowel up to 3.7 cm. IMPRESSION: 1. Esophageal tube tip overlies the distal stomach. 2. Multiple dilated loops of small bowel in the upper abdomen either due to obstruction or ileus Electronically Signed   By: Jasmine Pang M.D.   On: 11/08/2020 01:41   CT CHEST WO CONTRAST  Result Date: 11/19/2020 CLINICAL DATA:  Shortness of breath, cough. EXAM: CT CHEST WITHOUT CONTRAST TECHNIQUE: Multidetector CT imaging of the chest was performed following the standard protocol without IV contrast. COMPARISON:  Nov 08, 2020. FINDINGS: Cardiovascular: No significant vascular findings. Normal heart size. No pericardial effusion. Mediastinum/Nodes: Tracheostomy tube is in good position. Nasogastric tube is seen passing through esophagus into stomach. No adenopathy is noted. Thyroid gland is unremarkable. Pneumomediastinum is noted anteriorly. Subcutaneous emphysema is also noted in both supraclavicular regions. Lungs/Pleura: Continued presence of diffuse bilateral airspace opacities consistent with multifocal pneumonia. Minimal right pleural  effusion may be present. No definite pneumothorax is noted. Upper Abdomen: No acute abnormality. Musculoskeletal: No chest wall mass or suspicious bone lesions identified. IMPRESSION: Continued presence of diffuse bilateral lung opacities consistent with multifocal pneumonia. Minimal right pleural effusion may be present. Interval development of pneumomediastinum as well as bilateral supraclavicular subcutaneous emphysema. Electronically Signed   By: Lupita Raider M.D.   On: 11/19/2020 14:51   CT ANGIO CHEST PE W OR WO CONTRAST  Result Date: 11/08/2020 CLINICAL DATA:  Intubated, bilateral airspace disease, pulmonary embolus suspected EXAM: CT ANGIOGRAPHY CHEST WITH CONTRAST TECHNIQUE: Multidetector CT imaging of the chest was performed using the standard protocol during bolus administration of intravenous contrast. Multiplanar CT image reconstructions and MIPs were obtained to evaluate the vascular anatomy. CONTRAST:  OMNIPAQUE IOHEXOL 350 MG/ML SOLN COMPARISON:  11/08/2020 FINDINGS: Cardiovascular: This is a technically suboptimal evaluation of the pulmonary vasculature due to respiratory motion and timing of the contrast bolus. There is sufficient contrast enhancement within the main pulmonary arteries to exclude a large central or proximal segmental pulmonary embolus. The distal segmental and subsegmental branches are incompletely evaluated. The heart is unremarkable without pericardial effusion. No evidence of thoracic aortic aneurysm or dissection. Mediastinum/Nodes: Endotracheal tube identified well above carina. Enteric catheter extends into the gastric lumen. No pathologic adenopathy. Thyroid is grossly normal. Lungs/Pleura: Extensive confluent bilateral airspace disease is identified, greatest dependently. Small bilateral pleural effusions volume estimated less than 100 cc each. No evidence of pneumothorax. Central airways are patent. Upper Abdomen: Diffuse hepatic steatosis. No acute upper  abdominal findings. Musculoskeletal: No acute or destructive bony lesions. Reconstructed images demonstrate no additional findings. Review of the MIP images confirms the above findings. IMPRESSION: 1. Limited evaluation of the pulmonary vasculature due to timing of contrast bolus  and extensive respiratory motion artifact. No large central or proximal segmental pulmonary embolus. Distal segmental and subsegmental branches are incompletely evaluated. 2. Extensive confluent bilateral airspace disease not appreciably changed since prior chest x-ray. 3. Trace bilateral pleural effusions. 4. Hepatic steatosis. 5. Support devices as above. Electronically Signed   By: Sharlet Salina M.D.   On: 11/08/2020 03:13   US Venous Img Lower Bilateral (DVT)  Result Date: 11/20/2020 CLINICAL DATA:  40 year old male with bilateral lower extremity swelling. EXAM: BILATERAL LOWER EXTREMITY VENOUS DOPPLER ULTRASOUND TECHNIQUE: Gray-scale sonography with graded compression, as well as color Doppler and duplex ultrasound were performed to evaluate the lower extremity deep venous systems from the level of the common femoral vein and including the common femoral, femoral, profunda femoral, popliteal and calf veins including the posterior tibial, peroneal and gastrocnemius veins when visible. The superficial great saphenous vein was also interrogated. Spectral Doppler was utilized to evaluate flow at rest and with distal augmentation maneuvers in the common femoral, femoral and popliteal veins. COMPARISON:  None. FINDINGS: RIGHT LOWER EXTREMITY Common Femoral Vein: No evidence of thrombus. Normal compressibility, respiratory phasicity and response to augmentation. Saphenofemoral Junction: No evidence of thrombus. Normal compressibility and flow on color Doppler imaging. Profunda Femoral Vein: No evidence of thrombus. Normal compressibility and flow on color Doppler imaging. Femoral Vein: No evidence of thrombus. Normal compressibility,  respiratory phasicity and response to augmentation. Popliteal Vein: No evidence of thrombus. Normal compressibility, respiratory phasicity and response to augmentation. Calf Veins: No evidence of thrombus. Normal compressibility and flow on color Doppler imaging. Other Findings:  None. LEFT LOWER EXTREMITY Common Femoral Vein: No evidence of thrombus. Normal compressibility, respiratory phasicity and response to augmentation. Saphenofemoral Junction: No evidence of thrombus. Normal compressibility and flow on color Doppler imaging. Profunda Femoral Vein: No evidence of thrombus. Normal compressibility and flow on color Doppler imaging. Femoral Vein: No evidence of thrombus. Normal compressibility, respiratory phasicity and response to augmentation. Popliteal Vein: No evidence of thrombus. Normal compressibility, respiratory phasicity and response to augmentation. Calf Veins: No evidence of thrombus. Normal compressibility and flow on color Doppler imaging. Other Findings:  None. IMPRESSION: No evidence of bilateral lower extremity deep vein thrombosis. Marliss Coots, MD [facility] Electronically Signed   By: Marliss Coots MD   On: 11/20/2020 12:05   US Venous Img Lower Bilateral (DVT)  Result Date: 11/07/2020 CLINICAL DATA:  Edema Shortness of breath Fever EXAM: BILATERAL LOWER EXTREMITY VENOUS DOPPLER ULTRASOUND TECHNIQUE: Gray-scale sonography with compression, as well as color and duplex ultrasound, were performed to evaluate the deep venous system(s) from the level of the common femoral vein through the popliteal and proximal calf veins. COMPARISON:  None. FINDINGS: VENOUS Normal compressibility of the common femoral, superficial femoral, and popliteal veins, as well as the visualized calf veins. Visualized portions of profunda femoral vein and great saphenous vein unremarkable. No filling defects to suggest DVT on grayscale or color Doppler imaging.  Doppler waveforms show normal direction of venous flow, normal respiratory plasticity and response to augmentation. OTHER Nonspecific mildly prominent bilateral inguinal lymph nodes are seen. Limitations: none IMPRESSION: No lower extremity DVT Electronically Signed   By: Acquanetta Belling M.D.   On: 11/07/2020 14:49   DG Chest Port 1 View  Result Date: 12/01/2020 CLINICAL DATA:  Tachypnea EXAM: PORTABLE CHEST 1 VIEW COMPARISON:  11/30/2020 FINDINGS: Cardiac shadow is stable. Endotracheal tube, gastric catheter and left jugular central line are again seen and stable. Lungs are well aerated  bilaterally with diffuse airspace opacity stable from the prior study. No new focal abnormality is seen. IMPRESSION: Overall stable appearance of the chest. Electronically Signed   By: Alcide Clever M.D.   On: 12/01/2020 23:39   DG Chest Port 1 View  Result Date: 11/30/2020 CLINICAL DATA:  Endotracheal tube placement EXAM: PORTABLE CHEST 1 VIEW COMPARISON:  11/25/2020 FINDINGS: Endotracheal tube with tip measuring 4.1 cm above the carina. Enteric tube tip is off the field of view but below the left hemidiaphragm. Left central venous catheter tip overlies the junction of the SVC and brachiocephalic vein. No change in position of appliances. Heart size is normal. Diffuse airspace infiltrates throughout the lungs. No pleural effusions. No pneumothorax. Mediastinal contours appear intact. IMPRESSION: Appliances appear unchanged and in satisfactory position. Diffuse bilateral pulmonary infiltrates are unchanged. Electronically Signed   By: Burman Nieves M.D.   On: 11/30/2020 02:19   DG Chest Port 1 View  Result Date: 11/25/2020 CLINICAL DATA:  Acute respiratory failure EXAM: PORTABLE CHEST 1 VIEW COMPARISON:  Portable exam 0430 hours compared to 11/24/2020 FINDINGS: Tip of endotracheal tube projects 2.8 cm above carina. Nasogastric tube extends into stomach. LEFT jugular line with tip projecting over SVC. Stable heart size  mediastinal contours. Diffuse BILATERAL airspace infiltrates unchanged. No pleural effusion or pneumothorax. Osseous structures unremarkable. IMPRESSION: No change in severe diffuse BILATERAL pulmonary infiltrates. Electronically Signed   By: Ulyses Southward M.D.   On: 11/25/2020 07:42   DG Chest Port 1 View  Result Date: 11/24/2020 CLINICAL DATA:  Acute respiratory failure EXAM: PORTABLE CHEST 1 VIEW COMPARISON:  Radiograph 11/23/2020 FINDINGS: *Endotracheal tube tip terminates approximately 3.1 cm from the carina. *Transesophageal tube tip and side port distal to the GE junction. High attenuation enteric contrast media within the gastric lumen. *Left IJ approach central venous catheter tip terminates at the left brachiocephalic-caval confluence. *External support devices and telemetry leads overlie the chest. Persistent bilateral heterogeneous interstitial and airspace opacities throughout both lungs with minimal if any significant interval clearing from the comparison exam. Lung volumes remain low. Suspect bilateral pleural effusions. No visible pneumothorax. Stable cardiomediastinal contours though partially obscured by overlying opacity. No acute osseous or chest wall abnormalities. IMPRESSION: Lines and tubes as above. Persistent bilateral heterogeneous airspace disease, not significantly changed from prior Suspect trace effusions. Electronically Signed   By: Kreg Shropshire M.D.   On: 11/24/2020 04:27   DG Chest Port 1 View  Result Date: 11/23/2020 CLINICAL DATA:  Intubation EXAM: PORTABLE CHEST 1 VIEW COMPARISON:  Radiograph 11/22/2020 FINDINGS: Endotracheal tube in the mid to lower trachea, 2.8 cm from the carina. Transesophageal tube tip and side port are distal to the GE junction, terminating below the margins of imaging. Left IJ approach central venous catheter tip terminates in the region of the left brachiocephalic vein-superior caval confluence. Telemetry leads and support devices overlie the chest.  No significant interval change in the extensive bilateral mixed patchy interstitial and airspace disease throughout both lungs with a basilar gradient. Partial obscuration of cardiac margins with otherwise stable cardiomediastinal contours. No pneumothorax. No sizable layering pleural effusion. High attenuation enteric contrast media within the gastric lumen. No other acute osseous or soft tissue abnormality. IMPRESSION: 1. Endotracheal tube tip terminates 2.8 cm from the carina, could consider retraction 2 cm to position in the mid trachea. 2. Satisfactory positioning of the transesophageal tube. 3. Left IJ catheter terminates at the brachiocephalic-caval confluence. 4. Stable bilateral heterogeneous opacities compatible with ARDS. Electronically Signed   By: Samuella Cota  [facility] M.D.   On: 11/23/2020 02:50   DG Chest Port 1 View  Result Date: 11/22/2020 CLINICAL DATA:  Respiratory distress.  ARDS. EXAM: PORTABLE CHEST 1 VIEW COMPARISON:  Yesterday. FINDINGS: Endotracheal tube tip 2 cm above the carina. Left jugular catheter tip in the proximal superior vena cava. Nasogastric tube extending into the stomach. Injected contrast in the stomach. No significant change in extensive bilateral airspace opacity with obscuration of some of the heart borders. No gross cardiac enlargement. No pneumothorax. Unremarkable bones. IMPRESSION: 1. Endotracheal tube tip 2 cm above the carina. This could be retracted 3 cm. 2. Stable changes of ARDS. Electronically Signed   By: Beckie Salts M.D.   On: 11/22/2020 13:43   DG Chest Port 1 View  Result Date: 11/21/2020 CLINICAL DATA:  Respiratory distress. EXAM: PORTABLE CHEST 1 VIEW COMPARISON:  11/20/2020 FINDINGS: The endotracheal tube is 12 mm above the carina. The NG tube is coursing down the esophagus and into the stomach. Left IJ central venous catheter tip is near the brachiocephalic SVC junction. Persistent severe diffuse interstitial and airspace process in the lungs no  pneumothorax or pleural effusions. IMPRESSION: 1. The endotracheal tube is just above the carina as above. This could be retracted 2-3 cm. 2. Persistent severe diffuse interstitial and airspace process. Electronically Signed   By: Rudie Meyer M.D.   On: 11/21/2020 18:33   DG Chest Port 1 View  Result Date: 11/21/2020 CLINICAL DATA:  Acute respiratory failure EXAM: PORTABLE CHEST 1 VIEW COMPARISON:  11/18/2020 FINDINGS: Endotracheal tube is seen 2.2 cm above the carina. Nasogastric tube extends into the upper abdomen beyond the margin of the examination. Left internal jugular central venous catheter tip overlies the proximal superior vena cava. Pulmonary insufflation is stable. Superimposed diffuse airspace infiltrate appears stable when accounting for changes in radiographic technique, more focal within the lung bases bilaterally, in keeping with multifocal pneumonic infiltrate or ARDS. No pneumothorax or pleural effusion. Subcutaneous gas noted at the left neck base. Cardiac size within normal limits. No acute bone abnormality. IMPRESSION: Stable support lines and tubes. Stable pulmonary insufflation. Stable widespread slightly asymmetric airspace infiltrate, infection versus ARDS. Known pneumomediastinum is not well appreciated on this examination. Electronically Signed   By: Helyn Numbers MD   On: 11/21/2020 01:33   DG Chest Port 1 View  Result Date: 11/18/2020 CLINICAL DATA:  Pneumothorax.  Intubation. EXAM: PORTABLE CHEST 1 VIEW COMPARISON:  11/16/2020. FINDINGS: Endotracheal tube, NG tube, left IJ line in stable position. Heart size normal. Pneumomediastinum cannot be excluded. Diffuse severe bilateral pulmonary infiltrates/edema again noted. No pleural effusion. Skin fold noted over the left apex, no definite pneumothorax noted. Oral contrast in the stomach. Bilateral chest wall and neck subcutaneous emphysema again noted. IMPRESSION: 1.  Lines and tubes in stable position. 2. Pneumomediastinum  cannot be excluded. Bilateral chest wall and neck subcutaneous emphysema again noted. Skin fold noted over the left apex. No definite pneumothorax noted. 3. Diffuse severe bilateral pulmonary infiltrates/edema again noted. Electronically Signed   By: Maisie Fus  Register   On: 11/18/2020 05:54   DG Chest Port 1 View  Result Date: 11/16/2020 CLINICAL DATA:  Acute respiratory failure EXAM: PORTABLE CHEST 1 VIEW COMPARISON:  Chest x-rays dated 11/15/2020 and 11/14/2020. FINDINGS: Heart size and mediastinal contours are stable. Endotracheal tube with tip at the level of the clavicles. LEFT IJ central line is stable in position with tip at the level of the upper SVC. Enteric tube passes below the diaphragm. Diffuse bilateral airspace  opacities, not significantly changed compared to the recent chest x-rays. No pleural effusion is seen. Apparent subcutaneous emphysema overlying the LEFT chest and lower neck. IMPRESSION: 1. Subcutaneous emphysema overlying the LEFT chest wall and within the lower neck. This likely indicates underlying pneumothorax or pneumomediastinum. Consider chest CT for further characterization. 2. Diffuse bilateral airspace opacities, not significantly changed compared to the recent chest x-rays, compatible with multifocal pneumonia. 3. Support apparatus appears adequately positioned. Critical Value/emergent results were called by telephone at the time of interpretation on 11/16/2020 at 10:15 pm to provider BRITTON RUST-CHESTER , who verbally acknowledged these results. Electronically Signed   By: Bary Richard M.D.   On: 11/16/2020 22:20   Portable Chest x-ray  Result Date: 11/14/2020 CLINICAL DATA:  Endotracheal tube placement EXAM: PORTABLE CHEST 1 VIEW COMPARISON:  Portable exam 1233 hours compared to 11/12/2020 FINDINGS: Tip of endotracheal tube projects 3.7 cm above carina. Nasogastric tube extends into stomach. Normal heart size, mediastinal contours, and pulmonary vascularity. Diffuse BILATERAL  pulmonary infiltrates, probably unchanged when accounting for better degree of pulmonary inflation. No pleural effusion or pneumothorax. IMPRESSION: Persistent diffuse BILATERAL pulmonary infiltrates question multifocal pneumonia. Electronically Signed   By: Ulyses Southward M.D.   On: 11/14/2020 13:02   DG Chest Port 1 View  Result Date: 11/09/2020 CLINICAL DATA:  40 year old male with endotracheal tube. EXAM: PORTABLE CHEST 1 VIEW COMPARISON:  Chest x-ray 11/08/2020. FINDINGS: Endotracheal tube is low lying with tip now only 9 mm above the carina. Insert NG tube lung volumes are low. Patchy ill-defined opacities and widespread areas of interstitial prominence are noted throughout the lungs bilaterally. Overall, aeration has improved compared to the prior study. No pneumothorax. No definite pleural effusions. No engorgement of the pulmonary vasculature to suggest pulmonary edema. Heart size is normal. Upper mediastinal contours are within normal limits. IMPRESSION: 1. Support apparatus, as above. Please take note of the low lying position of the endotracheal tube and consider withdrawal approximately 5 cm for more optimal placement. 2. Persistent bilateral airspace disease, improved compared to the prior study, suggesting resolving multilobar pneumonia. Electronically Signed   By: Trudie Reed M.D.   On: 11/09/2020 08:04   DG Chest Port 1 View  Result Date: 11/08/2020 CLINICAL DATA:  Intubated EXAM: PORTABLE CHEST 1 VIEW COMPARISON:  11/08/2020, 10/17/2020 FINDINGS: Interval intubation, tip of the endotracheal tube is 2.8 cm superior to the carina. Esophageal tube tip is below the diaphragm. Widespread pulmonary airspace disease bilaterally without much change from radiograph earlier today. Definite worsening as compared with 10/27/2020. Stable cardiomediastinal silhouette. No pneumothorax. Probable pleural effusions. IMPRESSION: 1. Endotracheal tube tip about 2.8 cm superior to the carina 2. Extensive  bilateral pulmonary airspace disease. Electronically Signed   By: Jasmine Pang M.D.   On: 11/08/2020 01:43   DG Chest Port 1 View  Result Date: 11/08/2020 CLINICAL DATA:  Hypoxia EXAM: PORTABLE CHEST 1 VIEW COMPARISON:  10/30/2020 FINDINGS: Single frontal view of the chest demonstrates mild enlargement the cardiac silhouette. There is progressive worsening widespread bilateral airspace disease. No large effusion or pneumothorax. No acute bony abnormalities. IMPRESSION: 1. Worsening widespread bilateral airspace disease consistent with worsening edema or infection. Electronically Signed   By: Sharlet Salina M.D.   On: 11/08/2020 01:04   DG Chest Port 1 View  Result Date: 11/07/2020 CLINICAL DATA:  Shortness of breath and fever for approximately 1 week. EXAM: PORTABLE CHEST 1 VIEW COMPARISON:  None. FINDINGS: Heart size is normal. Low lung volumes are seen. Symmetric bilateral pulmonary  airspace disease is seen. No evidence of pleural effusion. IMPRESSION: Symmetric bilateral pulmonary airspace disease, which may be due to edema, atypical infection, or less likely hemorrhage. Electronically Signed   By: Danae Orleans M.D.   On: 11/16/2020 21:13   ECHOCARDIOGRAM COMPLETE  Result Date: 11/08/2020    ECHOCARDIOGRAM REPORT   Patient Name:   Jordan Gibson Date of Exam: 11/08/2020 Medical Rec #:  [id number]           Height:       67.0 in Accession #:    [id number]          Weight:       176.6 lb Date of Birth:  1981/05/03           BSA:          1.918 m Patient Age:    40 years            BP:           104/64 mmHg Patient Gender: M                   HR:           62 bpm. Exam Location:  [facility] Procedure: 2D Echo and Strain Analysis Indications:     Acute respiratory distress R06.03  History:         Patient has no prior history of Echocardiogram examinations.  Sonographer:     Overton Mam RDCS Referring Phys:  [id number] Judithe Modest Diagnosing Phys: Julien Nordmann MD  Sonographer Comments: Echo  performed with patient supine and on artificial respirator. Global longitudinal strain was attempted. IMPRESSIONS  1. Left ventricular ejection fraction, by estimation, is 60 to 65%. The left ventricle has normal function. The left ventricle has no regional wall motion abnormalities. There is mild left ventricular hypertrophy. Left ventricular diastolic parameters were normal. The average left ventricular global longitudinal strain is -18.7 %. The global longitudinal strain is normal.  2. Right ventricular systolic function is normal. The right ventricular size is normal. There is normal pulmonary artery systolic pressure. The estimated right ventricular systolic pressure is 30.0 mmHg. FINDINGS  Left Ventricle: Left ventricular ejection fraction, by estimation, is 60 to 65%. The left ventricle has normal function. The left ventricle has no regional wall motion abnormalities. The average left ventricular global longitudinal strain is -18.7 %. The global longitudinal strain is normal. The left ventricular internal cavity size was normal in size. There is mild left ventricular hypertrophy. Left ventricular diastolic parameters were normal. Right Ventricle: The right ventricular size is normal. No increase in right ventricular wall thickness. Right ventricular systolic function is normal. There is normal pulmonary artery systolic pressure. The tricuspid regurgitant velocity is 2.50 m/s, and  with an assumed right atrial pressure of 5 mmHg, the estimated right ventricular systolic pressure is 30.0 mmHg. Left Atrium: Left atrial size was normal in size. Right Atrium: Right atrial size was normal in size. Pericardium: There is no evidence of pericardial effusion. Mitral Valve: The mitral valve is normal in structure. No evidence of mitral valve regurgitation. No evidence of mitral valve stenosis. Tricuspid Valve: The tricuspid valve is normal in structure. Tricuspid valve regurgitation is mild . No evidence of tricuspid  stenosis. Aortic Valve: The aortic valve is normal in structure. Aortic valve regurgitation is not visualized. No aortic stenosis is present. Aortic valve peak gradient measures 5.9 mmHg. Pulmonic Valve: The pulmonic valve was normal in structure. Pulmonic valve regurgitation is not  visualized. No evidence of pulmonic stenosis. Aorta: The aortic root is normal in size and structure. Venous: The inferior vena cava is normal in size with greater than 50% respiratory variability, suggesting right atrial pressure of 3 mmHg. IAS/Shunts: No atrial level shunt detected by color flow Doppler.  LEFT VENTRICLE PLAX 2D LVIDd:         4.34 cm  Diastology LVIDs:         2.66 cm  LV e' medial:    9.68 cm/s LV PW:         1.34 cm  LV E/e' medial:  8.5 LV IVS:        1.23 cm  LV e' lateral:   14.30 cm/s LVOT diam:     2.10 cm  LV E/e' lateral: 5.8 LV SV:         73 LV SV Index:   38       2D Longitudinal Strain LVOT Area:     3.46 cm 2D Strain GLS Avg:     -18.7 %  RIGHT VENTRICLE RV Basal diam:  3.00 cm TAPSE (M-mode): 2.0 cm LEFT ATRIUM             Index       RIGHT ATRIUM           Index LA diam:        3.70 cm 1.93 cm/m  RA Area:     15.80 cm LA Vol (A2C):   43.2 ml 22.52 ml/m RA Volume:   42.10 ml  21.95 ml/m LA Vol (A4C):   39.5 ml 20.59 ml/m LA Biplane Vol: 45.5 ml 23.72 ml/m  AORTIC VALVE                PULMONIC VALVE AV Area (Vmax): 2.67 cm    PV Vmax:       0.83 m/s AV Vmax:        121.00 cm/s PV Peak grad:  2.7 mmHg AV Peak Grad:   5.9 mmHg LVOT Vmax:      93.30 cm/s LVOT Vmean:     63.800 cm/s LVOT VTI:       0.210 m  AORTA Ao Root diam: 2.80 cm Ao Asc diam:  2.80 cm MITRAL VALVE               TRICUSPID VALVE MV Area (PHT): 3.79 cm    TV Peak grad:   25.0 mmHg MV Decel Time: 200 msec    TV Vmax:        2.50 m/s MV E velocity: 82.30 cm/s  TR Peak grad:   25.0 mmHg MV A velocity: 40.70 cm/s  TR Vmax:        250.00 cm/s MV E/A ratio:  2.02                            SHUNTS                            Systemic VTI:   0.21 m                            Systemic Diam: 2.10 cm Julien Nordmann MD Electronically signed by Julien Nordmann MD Signature Date/Time: 11/08/2020/3:22:46 PM    Final    US Abdomen Limited RUQ (LIVER/GB)  Result Date: 11/29/2020 CLINICAL DATA:  [place] cholecystitis. EXAM: ULTRASOUND  ABDOMEN LIMITED RIGHT UPPER QUADRANT COMPARISON:  None. FINDINGS: Gallbladder: Gallbladder is distended with tumefactive sludge. No gallstones. Negative sonographic Murphy sign. Gallbladder wall 3 mm. No pericholecystic fluid. Common bile duct: Diameter: 7 mm, upper normal Liver: Diffusely increased echogenicity liver without focal liver lesion. Portal vein is patent on color Doppler imaging with normal direction of blood flow towards the liver. Other: None. IMPRESSION: Gallbladder sludge without gallstones. Common bile duct upper normal 7 mm in diameter. Electronically Signed   By: Marlan Palauharles  Clark M.D.   On: 11/29/2020 10:50    Microbiology No results found for this or any previous visit (from the past 240 hour(s)).  Lab Basic Metabolic Panel: Recent Labs  Lab 11/28/20 0431 11/29/20 0657 11/30/20 0451 12/01/20 0553 12/01/20 2331 Jan 18, 2021 0138 Jan 18, 2021 0711  NA 140 143 145 145 144  --  143  K 4.4 3.6 3.9 4.1 4.6  --  4.3  CL 93* 92* 95* 95* 94*  --  94*  CO2 39* 46* 47* 44* 46*  --  46*  GLUCOSE 151* 77 118* 163* 169*  --  95  BUN 24* 21* 22* 20 21*  --  20  CREATININE 0.45* 0.33* 0.52* 0.43* 0.46*  --  0.41*  CALCIUM 8.5* 8.4* 8.2* 8.3* 8.4*  --  8.2*  MG 2.1 2.2 2.2 2.2  --  2.3  --   PHOS 3.1 3.2 2.9 2.8  --  3.0  --    Liver Function Tests: Recent Labs  Lab 11/27/20 0441 11/28/20 0431 11/29/20 0657 11/30/20 0451 12/01/20 0553  AST 60* 44* 79* 77* 47*  ALT 188* 130* 157* 133* 91*  ALKPHOS 460* 390* 515* 556* 454*  BILITOT 1.2 0.9 0.9 0.8 0.7  PROT 6.3* 5.8* 5.6* 6.0* 6.0*  ALBUMIN 2.0* 1.9* 2.0* 2.1* 2.1*   No results for input(s): LIPASE, AMYLASE in the last 168 hours. No results for  input(s): AMMONIA in the last 168 hours. CBC: Recent Labs  Lab 11/29/20 0657 11/30/20 0451 12/01/20 0553 12/01/20 2331 Jan 18, 2021 0138  WBC 3.1* 2.7* 2.7* 2.7* 4.5  NEUTROABS 2.5 2.0 1.8 2.0 3.2  HGB 8.2* 8.4* 7.9* 7.4* 8.1*  HCT 28.4* 28.7* 27.5* 26.6* 29.1*  MCV 95.6 96.3 99.6 101.1* 101.0*  PLT 188 212 178 178 253   Cardiac Enzymes: No results for input(s): CKTOTAL, CKMB, CKMBINDEX, TROPONINI in the last 168 hours. Sepsis Labs: Recent Labs  Lab 11/30/20 0451 12/01/20 0553 12/01/20 2331 Jan 18, 2021 0138  WBC 2.7* 2.7* 2.7* 4.5    Procedures/Operations  Mechanical Intubation x2 Left Internal Jugular Central Line Placement  Right Radial Arterial Line Placement   Camie Patienceana Graves, AGNP  Pulmonary/Critical Care Pager [phone] (please enter 7 [phone]) PCCM Consult Pager [phone] (please enter [phone] digits)  -----------------------  Reviewed and agree with the note Chilton GreathousePraveen Breana Litts MD [facility] See Amion for pager  If no response to pager , please call [phone] until 7pm After 7:00 pm call Elink  [phone] 12/04/2020, 7:21 AM

## 2020-12-12 NOTE — Progress Notes (Signed)
Late entry:   1710: Patient's heart rate acutely dropped into 100s-110s from 130s-140s with MAP in 30s and 40s on a line. Patient still with femoral pulse by doppler. Patient CPR pads attached to code cart, chaplain, interpreter paged, NP and MD to bedside. Per NP, she stated that she thought that sister was saying that they wanted the ETT removed, but would wait for interpreter. RT to bedside to bag patient.   1727: patient HR dropped into 90s, femoral pulse lost, patient in PEA. Per MD and NP, patient is DNR but family wants to see patient die on the vent. When RT placed patient back on vent, family became visibly upset that she was no longer bagging patient and asked interpreter why we weren't doing anything anymore. NP explained to sister that pt was without a pulse, and that the vent was breathing for him. Sister visibly upset, sobbing.   Chaplain Thayer Ohm asked if he could pray with the family and the patient. While he was praying, patient went into asystole and sustained.   Time of death 49. Explained to family at bedside that pt was without any electrical activity in his heart, had no heart beat, and that the ventilator and drips would be turned off.

## 2020-12-12 NOTE — Progress Notes (Signed)
  Chaplain On-Call received phone call from RN Charli who reported that the patient is actively dying and patient's sister is at the bedside.  Chaplain arrived on the Unit and received medical update from RN Maralyn Sago.  Chaplain visited with the patient's sister Liliam at the bedside. The patient is non-responsive. Liliam requested prayer for the patient and their families.  Chaplain provided prayer and spiritual and emotional support, recognizing the anticipatory grief that Liliam is expressing.  Chaplain is available for additional support as needed.  Chaplain Evelena Peat M.Div., [facility]

## 2020-12-12 NOTE — Progress Notes (Addendum)
NAME:  Masoud Nyce, MRN:  [id number], DOB:  1980/06/30, LOS: 96 ADMISSION DATE:  11/03/2020  Brief History of Present Illness   Ardell Makarewicz is a 40 y.o. Male with no significant past medical history who presented to [facility] ED on 11/07/20 with a 1 week history of shortness of breath, nonproductive cough, and fever. He was admitted to Acute Hypoxic Respiratory Failure and Sepsis in the setting of suspected Community Acquired Pneumonia.    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 Not on HAART. CD4 count 9, viral load 1.5 million. 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 Also tested positive for Rhinovirus/enterovirus, Syphilis, and Toxoplasma now complicated by ARDS requiring mechanical ventilation support.   Pertinent  Medical History  No significant past medical history   Significant Hospital Events: Including procedures, antibiotic start and stop dates in addition to other pertinent events  10/25/2020: Presented to ED, to be admitted to stepdown unit by hospitalist 11/07/2020: Increasing FiO2 requirements and worsening respiratory status, PCCM consulted due to high risk for deterioration and need for intubation. ABX broadened to Cefepime and Vancomycin, along with Azithromycin 11/08/20- patient with respiratory failure s/p ETT overnight. Vancomycin dcd today MRSA pcr negative. 11/09/20- Pt extubated to HFNC _0  AND 45%.  Patient with severe ARDS. I met with family and reviewed care plan. +Rhino/ENtero virus 11/10/20- patient improved, has been extubated to HFNC.  Tranferring to step down unit, signed out to [facility]. 11/12/20- FiO2 increased to 75%, dcd IV NS at 50/h, have aded lasix 20 bid, decreased prednisone to 40 once daily, stopped po vicodin.  Metaneb ordered BID with RT for recruitment.  Repeat CXR today 11/13/20: Transfferd back to ICU  Due to severe acute hypoxic respiratory failure 11/14/20: Failed HHFN and BiPAP requiring emergent re- intubation. s/p bedside Bronchoscopy 11/15/20: Patient  continues to be hypoxic after increasing FiO2 from 50% to 100% & PEEP from 10 to 12. ABG post these changes revealed a PF ratio of 100 & respiratory acidosis. Proned 6/5 severe hypoxia, ARDS, AIDS, +renal failure 6/7 severe hypoxia, ALI 6/8 severe hypoxia, ARDS initiated PRONING 6/9 SEVERE HYPOXIA  6/10: Severe hypoxia, remains critically ill 6/11: Persistent issues with hypoxemia, increased sedation needs due to ventilator asynchrony 6/12: Persistent issues with ventilator asynchrony, increasing FiO2 requirements, updated sister and have made it very clear that his prognosis is exceedingly poor 11/24/20-  patient on mechanical ventilation on FiO2 100%, there was previosly recurrent dyssynchrony requiring numerous doses of paralytics to reach stable vital signs.  Complex MICU course on >8 infusions simultaneously. Reviewed in detail with RN this morning.  Very unfortunate and extremely ill young patient at high risk of death.  We considered transfer for potential ECMO - [facility] is at capacity unable to accept any patients, [facility] is at capacity unable to take any patients, [facility] able to review case but patient not candidate.  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  Reivewed with Palliative care today.  11/25/20- patient remains on PRVC 100% severe pneumonia with opportunistic infections due to advanced AIDS.  Patient is not responding to therapy with very poor prognosis 11/26/20- patient is worse overnight, he is desaturated to 85% on Fio2 100%.  Reviewed plan with ID and will initiate AIDS therapy today. Met with sister today reviewed goals of care recommendation from me for DNR.  Family wants to keep full code.  11/27/20- Patient is on 100% FiO2, no events overnight, remains high  risk of cardiopulmonary arrest. 11/28/20- Patient on PRVC 100, 15peep, patient on maximal support on ventilator 100% FiO2 11/29/20-patient was  able to make over 1 L of urine today with Lasix however subsequently developed hypotension and is requiring low-dose Neo-Synephrine support.  Met with family today reviewed medical plan. 11/30/20- patient is not improving,, he continues to require maximum support from ventilator and despite diuresis has not had significant improvement.  12/01/20- Weaned off pressors, however remains critically ill and no improvement in vent support (remains on 100% FiO2 & 15 PEEP) 2020-12-07-Pt declined overnight became severely hypoxic with O2 sats decreasing to the 50's despite full vent support current O2 sats 80%, meropenem added.  EKG concerning for ST elevation heparin gtt initiated, pt deemed NOT candidate for evaluation/ intervention due to hemodynamic instability   Cultures:  6/7: Blood culture x2>> 6/3: PJP PCR:  Positive 6/3: Toxoplasma PCR: negative, IgG positive 6/3: Histoplasma urinary Ag: <0.5 6/3: BAL:  Normal respiratory flora 6/1: RPR: Reactive, titer 1:4, Ab (+) 6/1: Cryptococcus Ag: (-) 5/31: Hep B/C screen: (-) 5/31: Quantiferon-TB: (-) 5/31: Histoplasma Gal[place]an: <0.5 5/31: Fungitell: >500 5/28:Mycoplasma pneumonia ab >>negative 5/28 Tracheal aspirate: Normal respiratory flora 5/28 Respiratory panel: (+) Rhinovirus / enterovirus 5/26: SARS-CoV-2 PCR>> negative 5/26: Influenza PCR>> negative 5/26: Blood culture x2>>No growth thus far 5/26: Urine Cx>>no growth 5/27: MRSA PCR>> negative 5/27:Strep pneumo urinary antigen>>negative 5/27:Legionella urinary antigen>>negative 06/3: Fungus Cx: negative>> 06/7: Blood Cx x2>>negative  6/08: CMV DNA over 1 million copies   Antimicrobials:  5/26 Azithromycin >> 5/31 5/26 Cefepime >> 5/30 5/27 Vancomycin > 5/28 5/31 Bactrim >>  6/3 Penicillin G Benzathine >> 6/4 Anidulafungin >>  6/7 Zosyn>>6/9 6/10 ganciclovir>> 6/14 bicillin LA>> 6/16 azithromycin>> 6/21 meropenem>>  Antibiotics Given (last 72 hours)     Date/Time Action Medication  Dose Rate   11/29/20 0923 Given   primaquine tablet 30 mg 30 mg    11/29/20 0923 Given   atovaquone (MEPRON) 750 MG/5ML suspension 1,500 mg 1,500 mg    11/29/20 0924 Given   emtricitabine-tenofovir AF (DESCOVY) 200-25 MG per tablet 1 tablet 1 tablet    11/29/20 0924 Given   dolutegravir (TIVICAY) tablet 50 mg 50 mg    11/29/20 1017 New Bag/Given   ganciclovir (CYTOVENE) 375 mg in sodium chloride 0.9 % 100 mL IVPB 375 mg 100 mL/hr   11/29/20 1352 New Bag/Given   clindamycin (CLEOCIN) IVPB 900 mg 900 mg 100 mL/hr   11/29/20 2210 Given   atovaquone (MEPRON) 750 MG/5ML suspension 1,500 mg 1,500 mg    11/29/20 2211 New Bag/Given   clindamycin (CLEOCIN) IVPB 900 mg 900 mg 100 mL/hr   11/29/20 2341 New Bag/Given   ganciclovir (CYTOVENE) 375 mg in sodium chloride 0.9 % 100 mL IVPB 375 mg 100 mL/hr   11/30/20 0603 New Bag/Given   clindamycin (CLEOCIN) IVPB 900 mg 900 mg 100 mL/hr   11/30/20 0929 Given   primaquine tablet 30 mg 30 mg    11/30/20 0930 Given   atovaquone (MEPRON) 750 MG/5ML suspension 1,500 mg 1,500 mg    11/30/20 0931 Given   emtricitabine-tenofovir AF (DESCOVY) 200-25 MG per tablet 1 tablet 1 tablet    11/30/20 1040 New Bag/Given   ganciclovir (CYTOVENE) 375 mg in sodium chloride 0.9 % 100 mL IVPB 375 mg 100 mL/hr   11/30/20 1220 Given  [tube feeds held 2.5 hours]   dolutegravir (TIVICAY) tablet 50 mg 50 mg    11/30/20 1443 New Bag/Given   clindamycin (CLEOCIN) IVPB 900 mg  900 mg 100 mL/hr   11/30/20 2240 Given   atovaquone (MEPRON) 750 MG/5ML suspension 1,500 mg 1,500 mg    11/30/20 2242 New Bag/Given   clindamycin (CLEOCIN) IVPB 900 mg 900 mg 100 mL/hr   11/30/20 2356 New Bag/Given   ganciclovir (CYTOVENE) 375 mg in sodium chloride 0.9 % 100 mL IVPB 375 mg 100 mL/hr   12/01/20 0543 New Bag/Given   clindamycin (CLEOCIN) IVPB 900 mg 900 mg 100 mL/hr   12/01/20 0925 Given   atovaquone (MEPRON) 750 MG/5ML suspension 1,500 mg 1,500 mg    12/01/20 0925 Given    emtricitabine-tenofovir AF (DESCOVY) 200-25 MG per tablet 1 tablet 1 tablet    12/01/20 0925 Given  [tube feeds held since 7AM]   dolutegravir (TIVICAY) tablet 50 mg 50 mg    12/01/20 0926 Given   primaquine tablet 30 mg 30 mg    12/01/20 1134 New Bag/Given   ganciclovir (CYTOVENE) 375 mg in sodium chloride 0.9 % 100 mL IVPB 375 mg 100 mL/hr   12/01/20 1348 New Bag/Given   clindamycin (CLEOCIN) IVPB 900 mg 900 mg 100 mL/hr   12/01/20 2115 Given   atovaquone (MEPRON) 750 MG/5ML suspension 1,500 mg 1,500 mg    12/01/20 2116 New Bag/Given   clindamycin (CLEOCIN) IVPB 900 mg 900 mg 100 mL/hr   12/20/2020 0148 New Bag/Given   meropenem (MERREM) 1 g in sodium chloride 0.9 % 100 mL IVPB 1 g 200 mL/hr   December 20, 2020 0238 New Bag/Given  [medication still in pharmacy]   ganciclovir (CYTOVENE) 375 mg in sodium chloride 0.9 % 100 mL IVPB 375 mg 100 mL/hr   December 20, 2020 0531 New Bag/Given   meropenem (MERREM) 1 g in sodium chloride 0.9 % 100 mL IVPB 1 g 200 mL/hr   2020-12-20 5643 New Bag/Given   clindamycin (CLEOCIN) IVPB 900 mg 900 mg 100 mL/hr      Scheduled Meds:  atovaquone  1,500 mg Per Tube BID   azithromycin  1,200 mg Per Tube Weekly   chlorhexidine gluconate (MEDLINE KIT)  15 mL Mouth Rinse BID   Chlorhexidine Gluconate Cloth  6 each Topical q morning   dolutegravir  50 mg Per Tube Daily   emtricitabine-tenofovir AF  1 tablet Per Tube Daily   feeding supplement (PROSource TF)  45 mL Per Tube Daily   free water  20 mL Per Tube Q4H   insulin aspart  0-20 Units Subcutaneous Q4H   insulin glargine  7 Units Subcutaneous QHS   mouth rinse  15 mL Mouth Rinse 10 times per day   methylPREDNISolone (SOLU-MEDROL) injection  40 mg Intravenous Q24H   midazolam  2 mg Intravenous Once   nutrition supplement (JUVEN)  1 packet Per Tube BID BM   pantoprazole (PROTONIX) IV  40 mg Intravenous Daily   penicillin g benzathine (BICILLIN-LA) IM  2.4 Million Units Intramuscular Weekly   primaquine  30 mg Per Tube  Daily   QUEtiapine  25 mg Per Tube QHS   Continuous Infusions:  sodium chloride Stopped (12/01/20 2328)   clindamycin (CLEOCIN) IV 900 mg (12/20/2020 0634)   feeding supplement (VITAL 1.5 CAL) 1,000 mL (12/01/20 0227)   ganciclovir Stopped (12/20/2020 0338)   heparin 750 Units/hr (2020/12/20 0600)   HYDROmorphone 4 mg/hr (12-20-2020 0600)   ketamine (KETALAR) Adult IV Infusion 2.5 mg/kg/hr (2020-12-20 0600)   meropenem (MERREM) IV 1 g (12/20/2020 0531)   midazolam 10 mg/hr (12-20-2020 0600)   norepinephrine (LEVOPHED) Adult infusion 5 mcg/min (12-20-20 0600)  phenylephrine (NEO-SYNEPHRINE) Adult infusion Stopped (Dec 04, 2020 0049)   PRN Meds:.acetaminophen, acetaminophen, hydrALAZINE, HYDROmorphone, metoprolol tartrate, midazolam, ondansetron **OR** ondansetron (ZOFRAN) IV, vecuronium   Objective   Blood pressure 129/78, pulse (!) 130, temperature (!) 101.66 F (38.7 C), temperature source Esophageal, resp. rate (!) 21, height 5' 7.01" (1.702 m), weight 73 kg, SpO2 (!) 81 %.    Vent Mode: PRVC FiO2 (%):  [100 %] 100 % Set Rate:  [32 bmp-35 bmp] 35 bmp Vt Set:  [420 mL] 420 mL PEEP:  [12 cmH20-16 cmH20] 14 cmH20 Plateau Pressure:  [36 cmH20-44 cmH20] 44 cmH20   Intake/Output Summary (Last 24 hours) at 12-04-20 0802 Last data filed at 12/04/2020 0700 Gross per 24 hour  Intake 3504.02 ml  Output 2025 ml  Net 1479.02 ml   Filed Weights   11/28/20 0440 12/01/20 0500 12-04-20 0406  Weight: 73 kg 73 kg 73 kg    REVIEW OF SYSTEMS  PATIENT IS UNABLE TO PROVIDE COMPLETE REVIEW OF SYSTEMS DUE TO SEVERE CRITICAL ILLNESS AND TOXIC METABOLIC ENCEPHALOPATHY   PHYSICAL EXAMINATION:  GENERAL: Critically ill-appearing male, laying in bed, intubated and sedated, intermittently asynchronous with the vent HEAD: Normocephalic, atraumatic.  EYES: Pupils equal, round, reactive to light.  No scleral icterus.  MOUTH: Moist mucosal membrane.  ET tube in place NECK: Supple, mild JVD PULMONARY: Coarse  breath sounds bilaterally, no wheezing, intermittently asynchronous with vent, slightly tachypneic  CARDIOVASCULAR: Tachycardia, s1s2, No murmurs, rubs, or gallops.  GASTROINTESTINAL: Soft, non distended, +bowel sounds x4.  MUSCULOSKELETAL: Normal bulk and tone, no deformities, 1+ generalized edema  NEUROLOGIC: Heavily sedated, does not follow commands, pupils PERRL SKIN: Warm and diaphoretic.  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 leading to severe hypoxic resp failure intubated x 2 with severe b/l ling disease and ARDS/ALI transient renal failrue now ith elevated liver tests with liver dysfunction +PJP PNEUMONIA +TOXO +SYPHILIS +RHINOVIRUS +CMV  Assessment / Plan:   Acute Hypoxic and Hypercapnic Respiratory Failure due to PJP Pneumonia Severe ARDS -Full vent support, implement lung protective strategies via ARDS Net Protocol -Reiterated ARDS protocol with VT 400 equals to 6 mL/kg -Wean FiO2 and PEEP as tolerated maintain O2 sats greater than 90% -Follow intermittent chest x-ray and ABG as needed -Spontaneous breathing trials when respiratory parameters met -Continue VAP bundle -As needed bronchodilators -Continue antibiotics per ID recommendations  -Worsening hypoxia meropenum initiated 06/21  Septic Shock ST elevation~06/21 2D Echo 11/08/20 :LVEF 60-65%, normal RV systolic function -Continuous cardiac monitoring -Vasopressors as needed to maintain MAP 65 or higher~ requiring low dose levophed  -Continue heparin gtt and trend troponin's  Severe Sepsis due to PJP Pneumonia Newly  diagnosed AIDS Persistent Fever -Monitor fever curve -Trend WBCs -Follow cultures -ID following, antibiotics as per ID -Currently on Azithromycin, Clindamycin, Atovaquone, Primaquine (instead of bactrim- due to cholestatic/hepatocellular mixed pattern) as per ID; Meropenum added 06/21 -CD4 is 7 and VL 1.5 million copies~initiated 06/16 -On HAART ~ Descovy + Ticicay -gancyclovir for CMV > 1 million copies  Hyponatremia in setting of volume overload>>resolved -Monitor I&O's / urinary output -Follow BMP -Ensure adequate renal perfusion -Avoid nephrotoxic agents as able -Replace electrolytes as indicated  Mild Transaminitis -Negative viral hepatitis panel -Trend LFT's -Abdominal US with gallbladder sludge without gallstones  Anemia without s/sx of overt bleeding -Monitor for S/Sx of bleeding -Trend CBC -Transfuse for Hgb <7  Acute toxic metabolic encephalopathy, need for sedation -Goal RASS -2 to -3 given severe ARDS -Dilaudid & Versed, and Precedex as needed to maintain RASS goal -SBT once parameters met, currently not stable for same due to high FiO2 requirement -Provide supportive care  Hyperglycemia -CBG's -SSI and scheduled lantus  -Follow ICU Hypo/Hyperglycemia protocol  Pt is critically ill.  Prognosis is guarded. High risk for cardiac arrest and death. Discussed goals of care and code status with pts sister along with additional family member utilizing [employer] consultant.  Strongly encouraged changing pts code status to DNR due to poor prognosis and lack of meaningful recovery.  At this time per family request pt to remain Full code  Best practice (right click and "Reselect all SmartList Selections" daily)  Diet:  Tube Feed Pain/Anxiety/Delirium protocol (if indicated): Yes (RASS goal -2 to -3) VAP protocol (if indicated): Yes DVT prophylaxis: Heparin gtt  GI prophylaxis: PPI Glucose control:  SSI Yes Central venous access:  Yes, and it is still  needed Arterial line:  yes, and is still needed Foley:  Yes, and it is still needed Mobility:  bed rest  PT consulted: N/A Last date of multidisciplinary goals of care discussion [2020-12-30]  Code Status:  full code Disposition: ICU  Labs   CBC: Recent Labs  Lab 11/29/20 0657 11/30/20 0451 12/01/20 0553 12/01/20 2331 12/30/20 0138  WBC 3.1* 2.7* 2.7* 2.7* 4.5  NEUTROABS 2.5 2.0 1.8 2.0 3.2  HGB 8.2* 8.4* 7.9* 7.4* 8.1*  HCT 28.4* 28.7* 27.5* 26.6* 29.1*  MCV 95.6 96.3 99.6 101.1* 101.0*  PLT 188 212 178 178 341    Basic Metabolic Panel: Recent Labs  Lab 11/28/20 0431 11/29/20 0657 11/30/20 0451 12/01/20 0553 12/01/20 2331 12-30-2020 0138  NA 140 143 145 145 144  --   K 4.4 3.6 3.9 4.1 4.6  --   CL 93* 92* 95* 95* 94*  --   CO2 39* 46* 47* 44* 46*  --   GLUCOSE 151* 77 118* 163* 169*  --   BUN 24* 21* 22* 20 21*  --   CREATININE 0.45* 0.33* 0.52* 0.43* 0.46*  --   CALCIUM 8.5* 8.4* 8.2* 8.3* 8.4*  --   MG 2.1 2.2 2.2 2.2  --  2.3  PHOS 3.1 3.2 2.9 2.8  --  3.0   GFR: Estimated Creatinine Clearance: 113.6 mL/min (A) (by C-G formula based on SCr of 0.46 mg/dL (L)). Recent Labs  Lab 11/30/20 0451 12/01/20 0553 12/01/20 2331 12/30/2020 0138  WBC 2.7* 2.7* 2.7* 4.5    Liver Function Tests: Recent Labs  Lab 11/27/20 0441 11/28/20 0431 11/29/20 0657 11/30/20 0451 12/01/20 0553  AST 60* 44* 79* 77* 47*  ALT 188* 130* 157* 133* 91*  ALKPHOS 460* 390* 515* 556* 454*  BILITOT 1.2 0.9 0.9 0.8 0.7  PROT 6.3* 5.8* 5.6* 6.0* 6.0*  ALBUMIN 2.0* 1.9* 2.0* 2.1* 2.1*   No results for input(s): LIPASE, AMYLASE in the last 168 hours. No results for input(s): AMMONIA in the last 168 hours.  ABG    Component Value Date/Time   PHART 7.32 (L) 12/01/2020 2329   PCO2ART 98 (HH) 12/01/2020 2329   PO2ART 68 (L) 12/01/2020 2329   HCO3 50.5 (H) 12/01/2020 2329   ACIDBASEDEF 2.3 (H) 11/16/2020 0441  O2SAT 91.7 12/01/2020 2329      Coagulation Profile: Recent Labs   Lab 12/01/20 2331  INR 1.1    Cardiac Enzymes: No results for input(s): CKTOTAL, CKMB, CKMBINDEX, TROPONINI in the last 168 hours.   HbA1C: Hgb A1c MFr Bld  Date/Time Value Ref Range Status  11/12/2020 05:37 AM 5.9 (H) 4.8 - 5.6 % Final    Comment:    (NOTE)         Prediabetes: 5.7 - 6.4         Diabetes: >6.4         Glycemic control for adults with diabetes: <7.0     CBG: Recent Labs  Lab 12/01/20 1527 12/01/20 1932 12/01/20 2326 2020/12/29 0350 12/29/2020 0723  GLUCAP 169* 139* 157* 97 101*    Allergies No Known Allergies

## 2020-12-12 NOTE — Progress Notes (Signed)
Pt extubated at 1735

## 2020-12-12 NOTE — Progress Notes (Signed)
Date of Admission:  10/12/2020     ID: Jordan Gibson is a 40 y.o. 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[place])   Acute respiratory distress syndrome (ARDS) ([place])   PCP treatment since 11/11/20 Ganciclovir since 11/21/20 HAART since 11/27/20   LDA Foley 11/15/20>> A line 11/24/20 Triple lumen 11/15/20 RT IJ    Subjective: intubated  Medications:   atovaquone  1,500 mg Per Tube BID   azithromycin  1,200 mg Per Tube Weekly   chlorhexidine gluconate (MEDLINE KIT)  15 mL Mouth Rinse BID   Chlorhexidine Gluconate Cloth  6 each Topical q morning   dextrose  25 g Intravenous STAT   dolutegravir  50 mg Per Tube Daily   feeding supplement (PROSource TF)  45 mL Per Tube Daily   free water  20 mL Per Tube Q4H   insulin aspart  0-20 Units Subcutaneous Q4H   insulin glargine  7 Units Subcutaneous QHS   mouth rinse  15 mL Mouth Rinse 10 times per day   methylPREDNISolone (SOLU-MEDROL) injection  40 mg Intravenous Q12H   midazolam  2 mg Intravenous Once   nutrition supplement (JUVEN)  1 packet Per Tube BID BM   pantoprazole (PROTONIX) IV  40 mg Intravenous Daily   penicillin g benzathine (BICILLIN-LA) IM  2.4 Million Units Intramuscular Weekly   primaquine  30 mg Per Tube Daily   QUEtiapine  25 mg Per Tube QHS    Objective: Vital signs in last 24 hours:  Patient Vitals for the past 24 hrs:  BP Temp Temp src Pulse Resp SpO2 Weight  12-26-20 1326 -- -- -- -- -- (!) 44 % --  12-26-2020 1315 -- 99.5 F (37.5 C) -- (!) 134 (!) 35 (!) 45 % --  12-26-20 1300 103/62 99.5 F (37.5 C) -- (!) 137 (!) 35 (!) 46 % --  Dec 26, 2020 1245 -- 99.5 F (37.5 C) -- (!) 137 (!) 35 (!) 44 % --  12/26/20 1230 -- 99.5 F (37.5  C) -- (!) 139 (!) 35 (!) 45 % --  12/26/20 1215 -- 99.5 F (37.5 C) -- (!) 139 (!) 35 (!) 47 % --  26-Dec-2020 1200 123/73 99.5 F (37.5 C) Esophageal (!) 139 (!) 35 (!) 46 % --  Dec 26, 2020 1145 -- 99.5 F (37.5 C) -- (!) 134 (!) 35 (!) 48 % --  Dec 26, 2020 1130 -- 99.5 F (37.5 C) -- (!) 132 (!) 35 (!) 50 % --  12-26-2020 1115 -- 99.5 F (37.5 C) -- (!) 131 (!) 31 (!) 48 % --  2020/12/26 1100 99/64 99.5 F (37.5 C) -- (!) 133 (!) 35 (!) 51 % --  12/26/2020 1045 -- 99.5 F (37.5 C) -- (!) 132 (!) 35 (!) 49 % --  12/26/2020 1030 -- 99.32 F (37.4 C) -- (!) 141 (!) 35 (!) 51 % --  December 26, 2020 1015 -- 99.32 F (37.4 C) -- (!) 128 (!) 32 (!) 55 % --  12/26/20 1000 115/68 99.32 F (37.4 C) -- (!) 129 (!) 31 (!) 57 % --  12-26-20 0945 -- 99.14 F (37.3 C) -- (!) 128 (!) 31 (!) 57 % --  December 26, 2020 0930 -- 99.14 F (37.3 C) -- (!)  129 (!) 31 (!) 61 % --  December 31, 2020 0915 -- 98.96 F (37.2 C) -- (!) 129 (!) 31 (!) 63 % --  12/31/2020 0900 125/75 98.96 F (37.2 C) -- (!) 127 (!) 35 (!) 66 % --  12/31/20 0845 -- 98.78 F (37.1 C) -- (!) 130 (!) 35 (!) 71 % --  December 31, 2020 0830 -- 99.14 F (37.3 C) -- (!) 126 20 (!) 77 % --  2020/12/31 0815 -- 99.14 F (37.3 C) -- (!) 127 19 (!) 78 % --  12-31-20 0800 116/71 99.14 F (37.3 C) Esophageal (!) 129 (!) 29 (!) 80 % --  12/31/20 0745 -- 99.5 F (37.5 C) -- (!) 129 (!) 27 (!) 83 % --  December 31, 2020 0730 -- 99.5 F (37.5 C) -- (!) 128 (!) 32 (!) 83 % --  12-31-20 0715 -- 99.5 F (37.5 C) -- (!) 129 (!) 24 (!) 81 % --  12-31-20 0600 129/78 (!) 101.66 F (38.7 C) Esophageal (!) 130 (!) 21 (!) 82 % --  12-31-20 0500 113/79 (!) 102.38 F (39.1 C) Esophageal (!) 137 (!) 22 (!) 82 % --  12-31-2020 0406 -- -- -- -- -- -- 73 kg  12-31-20 0400 115/74 (!) 102.02 F (38.9 C) Esophageal (!) 139 13 (!) 79 % --  Dec 31, 2020 0301 -- -- -- -- -- (!) 74 % --  12-31-20 0300 130/80 (!) 101.48 F (38.6 C) -- (!) 138 (!) 26 (!) 74 % --  12-31-20 0200 120/71 (!) 101.12 F (38.4 C) -- (!) 140  (!) 35 (!) 79 % --  2020-12-31 0100 139/82 (!) 101.12 F (38.4 C) -- (!) 140 (!) 32 (!) 71 % --  2020-12-31 0000 108/63 (!) 100.76 F (38.2 C) Esophageal (!) 143 (!) 32 (!) 71 % --  12/01/20 2300 108/68 99.86 F (37.7 C) Esophageal (!) 138 (!) 28 90 % --  12/01/20 2200 108/69 99.32 F (37.4 C) Esophageal (!) 132 (!) 35 (!) 88 % --  12/01/20 2100 123/71 99.14 F (37.3 C) Esophageal (!) 129 (!) 32 90 % --  12/01/20 2000 98/65 99.14 F (37.3 C) Esophageal (!) 130 (!) 31 90 % --  12/01/20 1913 -- -- -- -- -- 93 % --  12/01/20 1900 127/80 99.14 F (37.3 C) Esophageal (!) 129 (!) 35 92 % --  12/01/20 1800 128/78 -- -- (!) 127 (!) 35 91 % --  12/01/20 1700 128/72 -- -- (!) 122 (!) 35 91 % --  12/01/20 1600 118/72 98.78 F (37.1 C) Esophageal (!) 124 (!) 31 93 % --    Temp:  [98.78 F (37.1 C)-102.38 F (39.1 C)] 99.5 F (37.5 C) (06/21 1315) Pulse Rate:  [122-143] 134 (06/21 1315) Resp:  [13-35] 35 (06/21 1315) BP: (98-139)/(62-82) 103/62 (06/21 1300) SpO2:  [44 %-93 %] 44 % (06/21 1326) Arterial Line BP: (84-147)/(39-69) 99/45 (06/21 1315) FiO2 (%):  [100 %] 100 % (06/21 1326) Weight:  [73 kg] 73 kg (06/21 0406)  PHYSICAL EXAM:  General:  intubated and sedated   lungs: b/l crepts. Heart: tachycardia Abdomen: Soft,  Extremities: atraumatic, no cyanosis. No edema. No clubbing Skin: No rashes or lesions. Or bruising Lymph: Cervical, supraclavicular normal. Neurologic: cannot assess  Lab Results Recent Labs    12/01/20 2331 12-31-2020 0138 12/31/2020 0711  WBC 2.7* 4.5  --   HGB 7.4* 8.1*  --   HCT 26.6* 29.1*  --   NA 144  --  143  K 4.6  --  4.3  CL 94*  --  94*  CO2 46*  --  46*  BUN 21*  --  20  CREATININE 0.46*  --  0.41*   Liver Panel Recent Labs    11/30/20 0451 12/01/20 0553  PROT 6.0* 6.0*  ALBUMIN 2.1* 2.1*  AST 77* 47*  ALT 133* 91*  ALKPHOS 556* 454*  BILITOT 0.8 0.7   Sedimentation Rate No results for input(s): ESRSEDRATE in the last 72  hours. C-Reactive Protein No results for input(s): CRP in the last 72 hours.  Microbiology:  Studies/Results: DG Chest Port 1 View  Result Date: 12/01/2020 CLINICAL DATA:  Tachypnea EXAM: PORTABLE CHEST 1 VIEW COMPARISON:  11/30/2020 FINDINGS: Cardiac shadow is stable. Endotracheal tube, gastric catheter and left jugular central line are again seen and stable. Lungs are well aerated bilaterally with diffuse airspace opacity stable from the prior study. No new focal abnormality is seen. IMPRESSION: Overall stable appearance of the chest. Electronically Signed   By: Inez Catalina M.D.   On: 12/01/2020 23:39     Assessment/Plan: Acute hypoxic respiratory failure.  Worsening respiratory status. ARDS 100% oxygen  Cardiopulmonary decompensation last night- critically ill  Aids started on HAART 11/27/2020 but would DC because of worsening clinical status and risk for IRIS   PJP pneumonia on day 29 of treatment CMV viremia and pneumonitis on ganciclovir  Toxoplasma IgG elevated indicating secondary reactivation.   Pt is critically ill- discussed with sister at bed side

## 2020-12-12 NOTE — Progress Notes (Signed)
Pt's sats sustaining in 40s with HR in 130s. Sister remains at bedside and has no needs at this time.

## 2020-12-12 NOTE — Progress Notes (Signed)
[facility] notified of cardiac TOD at 1735. Pt not suitable for donation.

## 2020-12-12 NOTE — Progress Notes (Signed)
Pt dyssynchronous with vent, sats dropped to 77-78%. Patient's sats dropped from 77% to 69% within minutes despite administration of vec and boluses of dilaudid and versed. NP, MD, RT, and charge RN aware.

## 2020-12-12 NOTE — Progress Notes (Addendum)
CH paged at 5:20pm to provide support for pt.'s sister and friend at bedside, distraught and talking w/additional family on the phone outside pt.'s rm.  Unit secretary shared pt. is in danger of cardiac arrest, medical team discussing code status w/family.  Family changed code status to DNR; CH present as family were called into rm. to be with pt.  Sister and friend acutely distraught, weeping over pt.  CH remained present at bedside after pt. died and at length offered via medical interpreter at bedside to pray for pt. and family; family amenable to prayer.  Family remained in rm. as medical team removed tubes and wires from pt.  Sister's husband arrived shortly after this.  Family grieving, supporting one another.  When [facility] checked in on family, pt.'s son-in-law asked via video interpreter what needed to be done to arrange for pt.'s body to be brought back to [place]  CH consulted [facility] and ICU secretary and relayed that family must coordinate international transport with local funeral home; gave family list of options.  Sister grateful for assistance.  No further needs at this time.

## 2020-12-12 NOTE — Consult Note (Signed)
ANTICOAGULATION CONSULT NOTE - Consult  Pharmacy Consult for Heparin gtt Indication: chest pain/ACS  No Known Allergies  Patient Measurements: Height: 5' 7.01" (170.2 cm) Weight: 73 kg (160 lb 15 oz) IBW/kg (Calculated) : 66.12 Heparin Dosing Weight: 80.1 kg  Vital Signs: Temp: 99.5 F (37.5 C) (06/21 1315) Temp Source: Esophageal (06/21 1200) BP: 103/62 (06/21 1300) Pulse Rate: 134 (06/21 1315)  Labs: Recent Labs    12/01/20 0553 12/01/20 0553 12/01/20 2331 11/17/2020 0138 11/24/2020 0711 11/21/2020 1255  HGB 7.9*  --  7.4* 8.1*  --   --   HCT 27.5*  --  26.6* 29.1*  --   --   PLT 178  --  178 253  --   --   LABPROT  --   --  14.1  --   --   --   INR  --   --  1.1  --   --   --   HEPARINUNFRC  --   --   --   --   --  0.32  CREATININE 0.43*  --  0.46*  --  0.41*  --   TROPONINIHS  --    < > 28* 51* 73* 121*   < > = values in this interval not displayed.    Estimated Creatinine Clearance: 113.6 mL/min (A) (by C-G formula based on SCr of 0.41 mg/dL (L)).   Medications:  No AC/APT PTA & NKDA Enox 40mg  q24h (VTE ppx) --> Heparin gtt (ACS/STEMI)  Heparin Dosing Weight: 80.1 kg  Assessment: 40 yo Male with h/o advanced AIDS (new Dx) presenting with acute respiratory failure c/b severe PJP PNA, syphilis, toxo, rhinovirus, & CMV infxn reqiuiring intubation 5/28-30; reintubated 6/3>>. ST elevations were noted on EKG and troponin trend upward 28>73>121. Team started heparin gtt and later consulted pharmacy for further titration/mgmt.  Ordered stat level to assess 750 un/hr heparin gtt started at 0621 [id number] (no bolus at start). HL 0.31  Date Time aPTT/HL Rate/Comment 0621 1255 0.32  Thera x1; 750 un/hr      Baseline Labs: aPTT - 33s (5/26) INR - 1.1 (5/26) Hgb - 13 on admit > slowly trended to 8.1 now Plts - 324 on admit > 253  Goal of Therapy:  Heparin level 0.3-0.7 units/ml Monitor platelets by anticoagulation protocol: Yes   Plan:  HL 0.31 therapeutic x1;  Continue Heparin at current rate of 750 un/hr and repeat HL in 6hrs to confirm rate.  12-23-1985 Aniketh Huberty 11/22/2020,1:55 PM

## 2020-12-12 NOTE — Progress Notes (Signed)
43ml of ketamine wasted into stericycle, witnessed by Levonne Spiller, RN. Unable to waste in pxyis, Darl Pikes, pharmacist aware.

## 2020-12-12 DEATH — deceased

## 2021-01-02 LAB — ACID FAST CULTURE WITH REFLEXED SENSITIVITIES (MYCOBACTERIA): Acid Fast Culture: NEGATIVE

## 2021-01-02 LAB — MISC LABCORP TEST (SEND OUT): Labcorp test code: 183753

## 2022-05-14 LAB — HISTOPLASMA GAL'MANNAN AG SER: Histoplasma Gal'mannan Ag Ser: <0.5 (ref <0.5)

## 2022-05-21 LAB — HISTOPLASMA ANTIGEN, URINE: Histoplasma Antigen, urine: <0.5 (ref <0.5)

## 2022-08-20 IMAGING — US US EXTREM LOW VENOUS
1 series · 14 of 24 positions shown · non-contrast
Comparison: None.

CLINICAL DATA: Edema

Shortness of breath
Fever
EXAM:
BILATERAL LOWER EXTREMITY VENOUS DOPPLER ULTRASOUND
TECHNIQUE: Gray-scale sonography with compression, as well as color and duplex
ultrasound, were performed to evaluate the deep venous system(s)
from the level of the common femoral vein through the popliteal and
proximal calf veins.

[Series 1: lev · 14 of 70 slices shown]
[im 1/70]
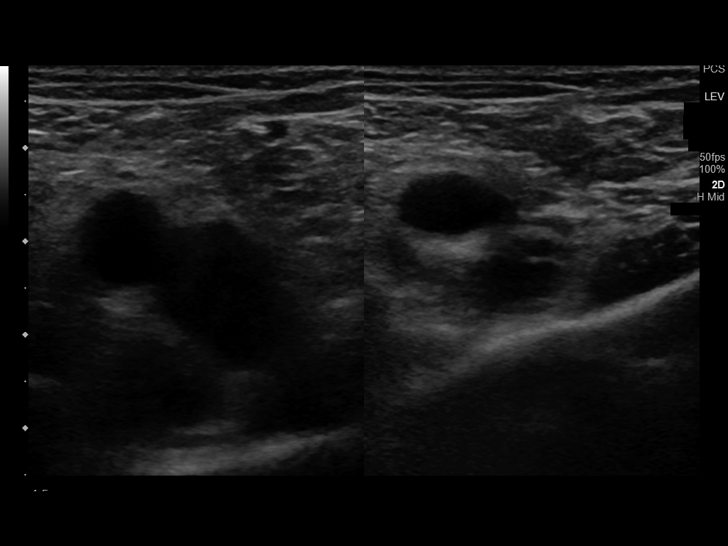
[im 7/70]
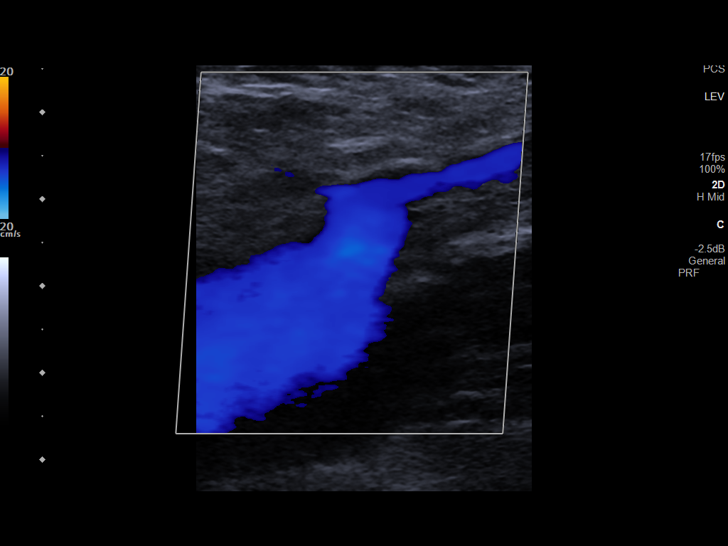
[im 13/70]
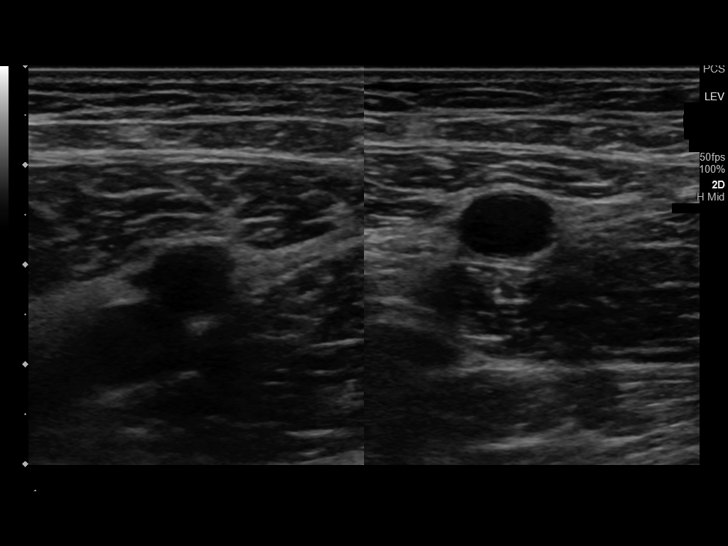
[im 19/70]
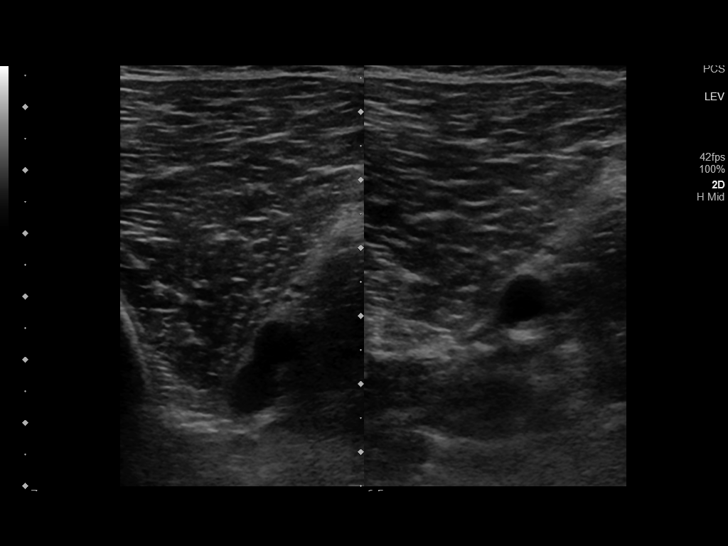
[im 22/70]
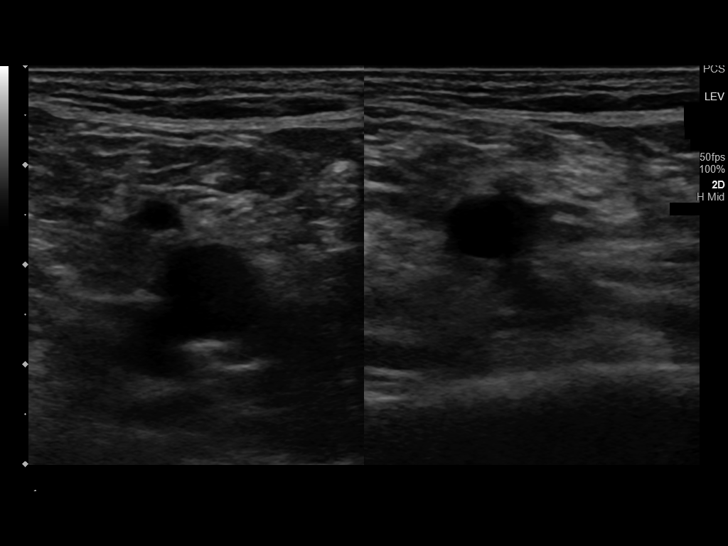
[im 28/70]
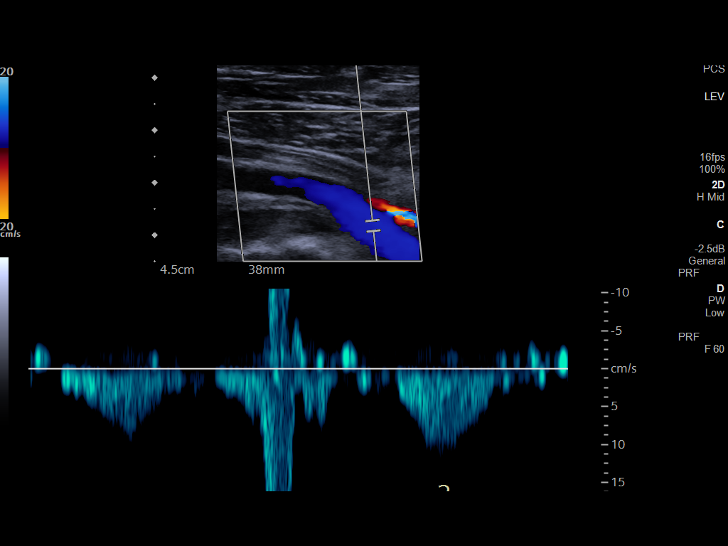
[im 34/70]
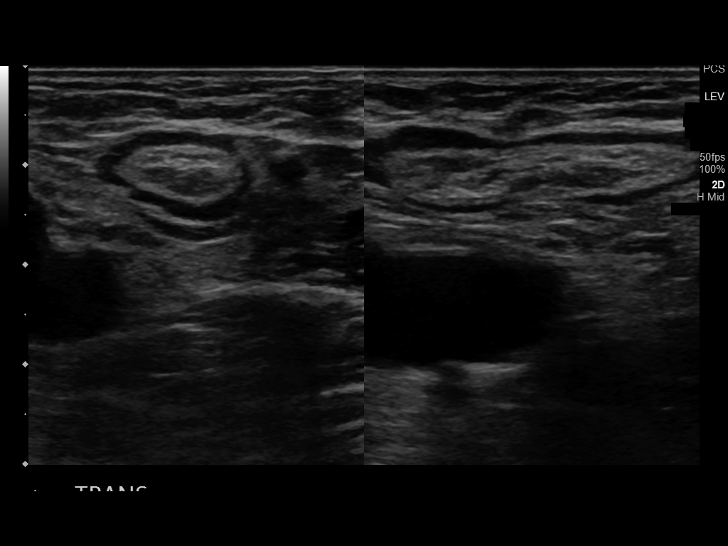
[im 37/70]
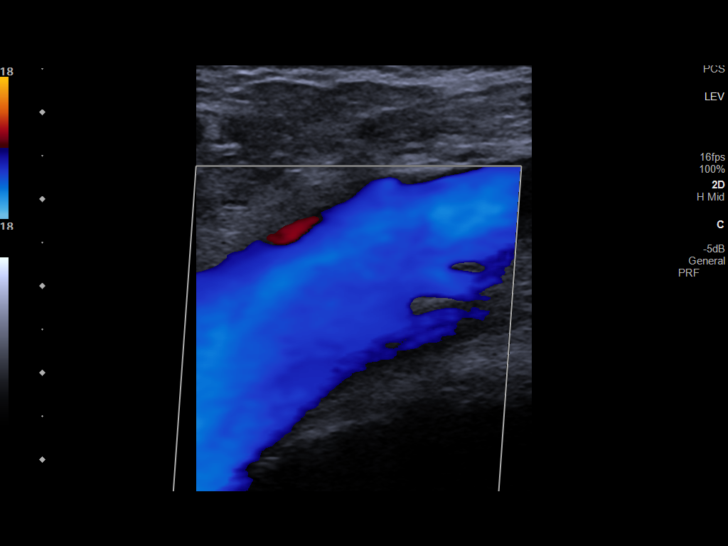
[im 43/70]
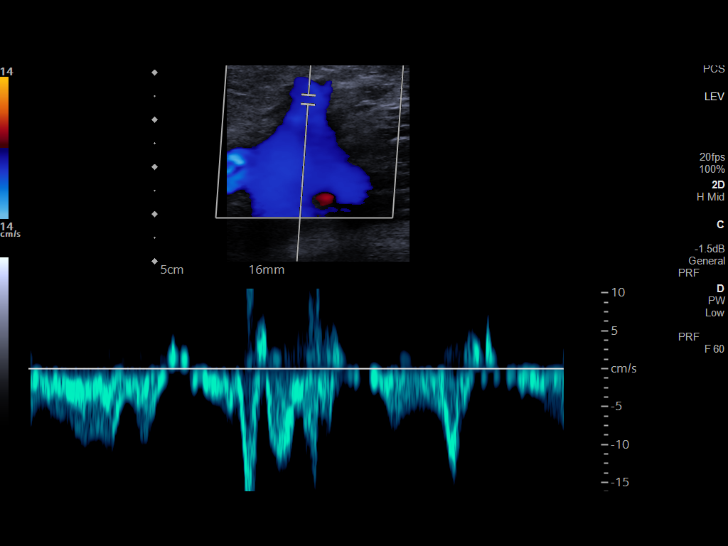
[im 49/70]
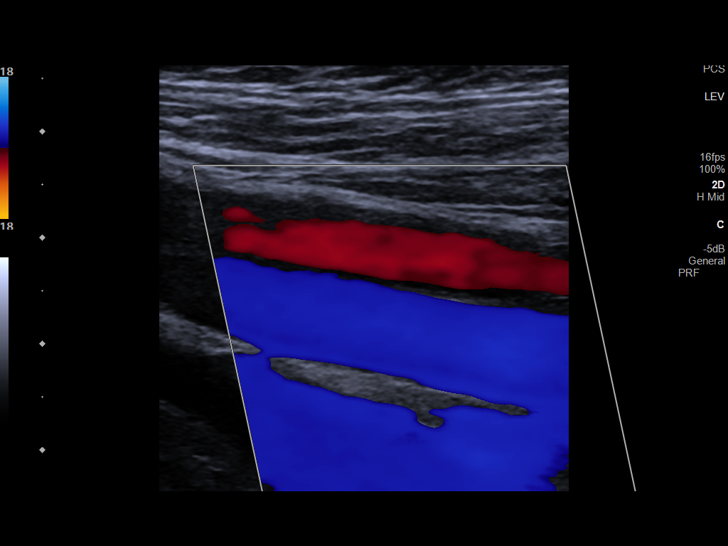
[im 55/70]
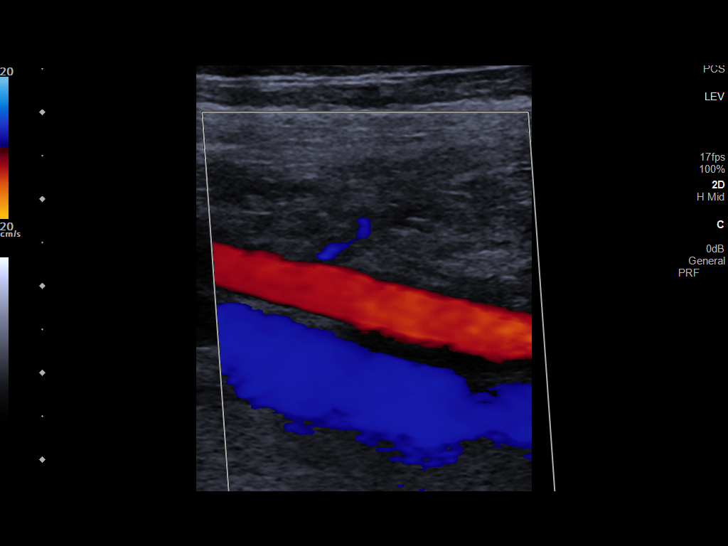
[im 58/70]
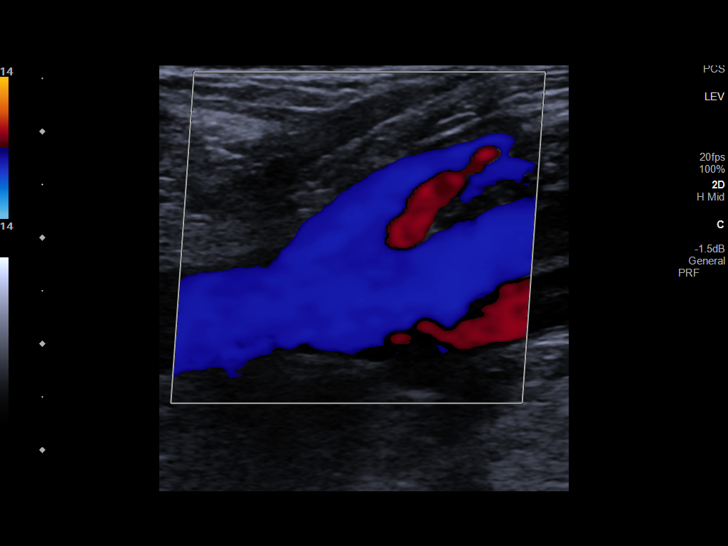
[im 64/70]
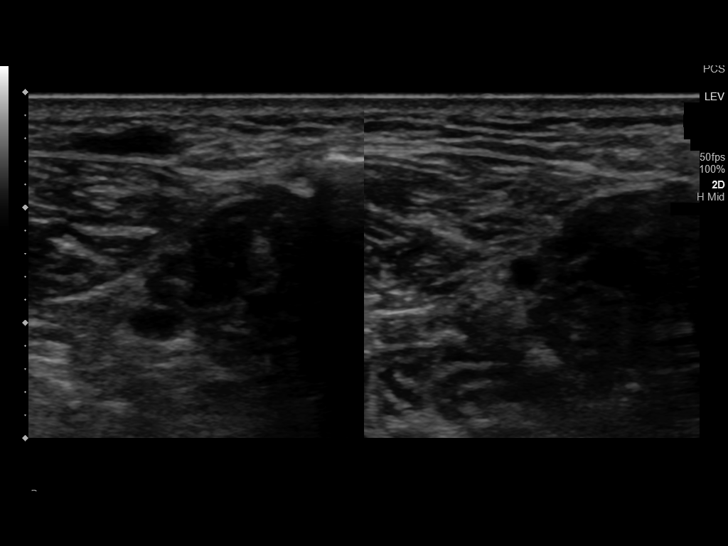
[im 70/70]
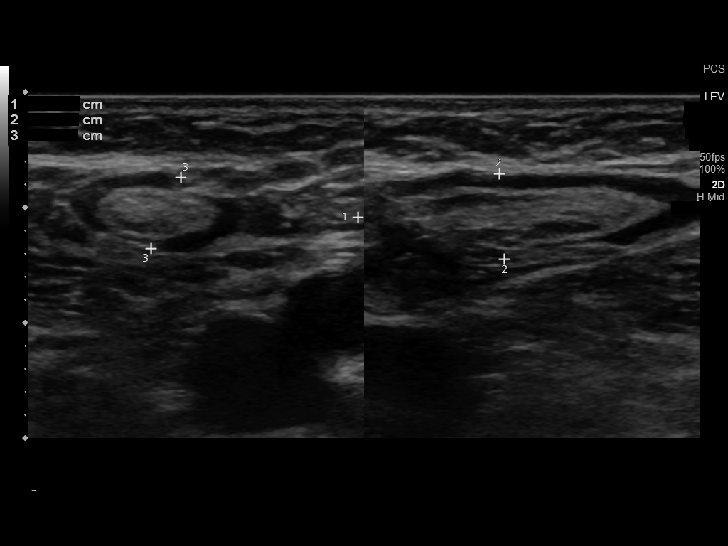

[14 of 24 positions shown; findings below may reference images not displayed]

FINDINGS: VENOUS

Normal compressibility of the common femoral, superficial femoral,
and popliteal veins, as well as the visualized calf veins.
Visualized portions of profunda femoral vein and great saphenous
vein unremarkable. No filling defects to suggest DVT on grayscale or
color Doppler imaging. Doppler waveforms show normal direction of
venous flow, normal respiratory plasticity and response to
augmentation.

OTHER

Nonspecific mildly prominent bilateral inguinal lymph nodes are
seen.

Limitations: none
IMPRESSION: No lower extremity DVT

## 2022-08-21 IMAGING — DX DG CHEST 1V PORT
1 series · 1 of 1 positions shown · non-contrast
Comparison: 11/08/2020, 11/06/2020

CLINICAL DATA: Intubated

EXAM:
PORTABLE CHEST 1 VIEW

[chest ap]
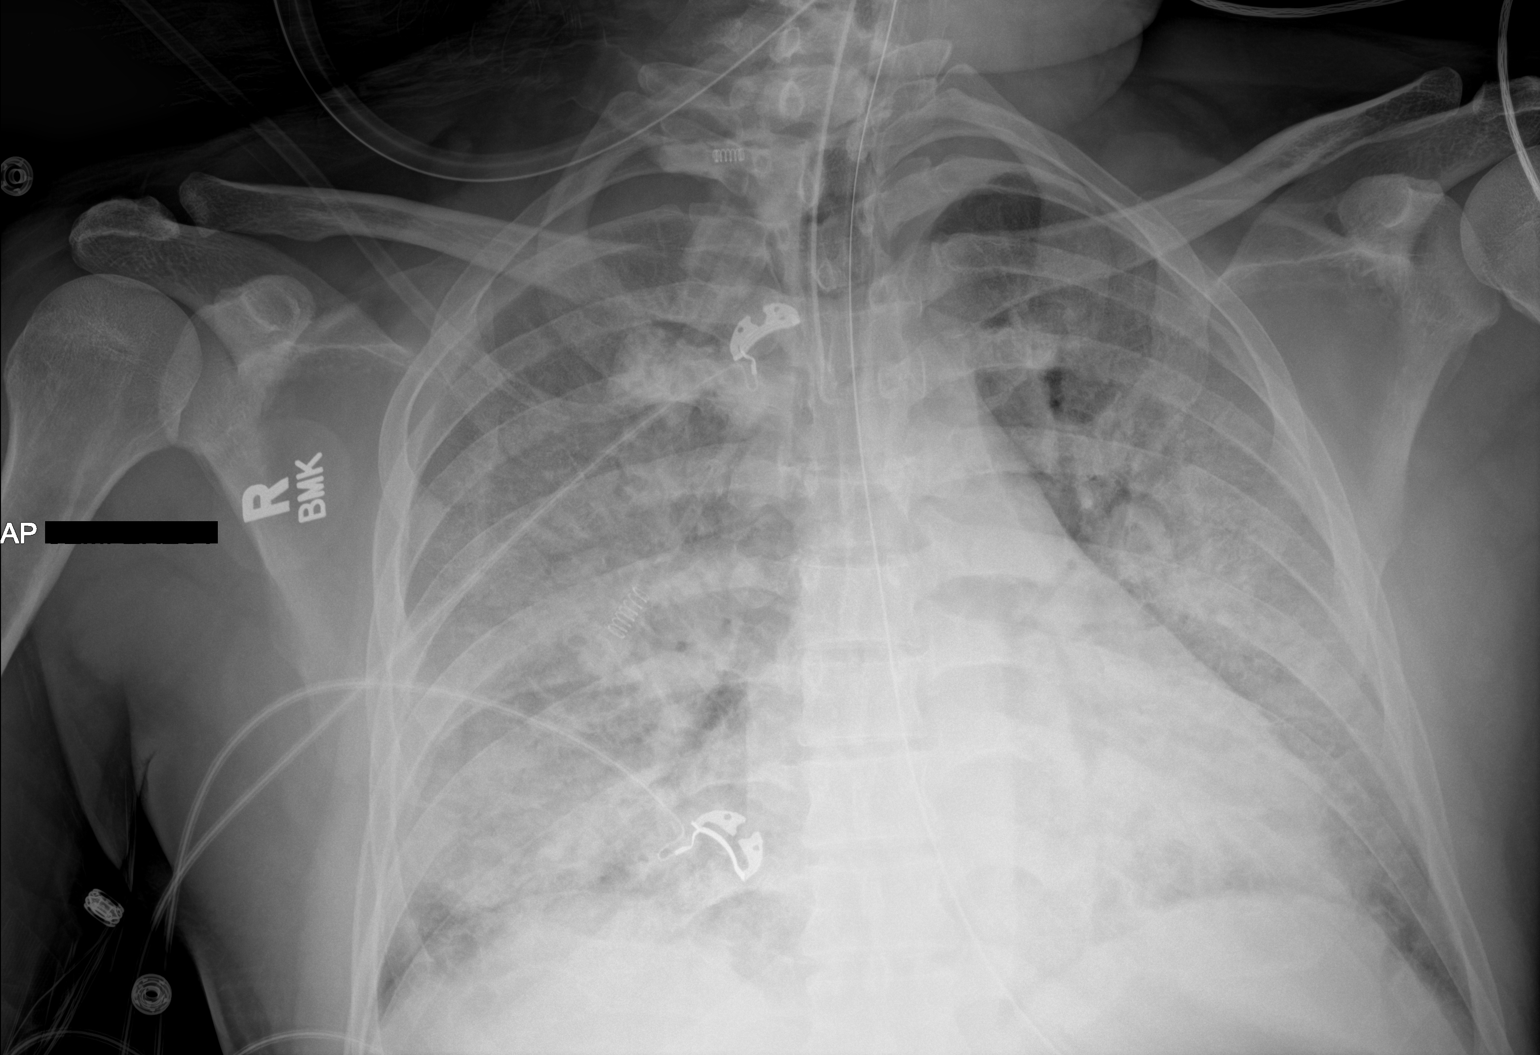

[1 of 1 positions shown; findings below may reference images not displayed]

FINDINGS: Interval intubation, tip of the endotracheal tube is 2.8 cm superior
to the carina. Esophageal tube tip is below the diaphragm.
Widespread pulmonary airspace disease bilaterally without much
change from radiograph earlier today. Definite worsening as compared
with 11/06/2020. Stable cardiomediastinal silhouette. No
pneumothorax. Probable pleural effusions.
IMPRESSION: 1. Endotracheal tube tip about 2.8 cm superior to the carina
2. Extensive bilateral pulmonary airspace disease.

## 2022-08-31 IMAGING — DX DG CHEST 1V PORT
1 series · 1 of 1 positions shown · non-contrast
Comparison: 11/16/2020.

CLINICAL DATA: Pneumothorax.  Intubation.

EXAM:
PORTABLE CHEST 1 VIEW

[chest ap]
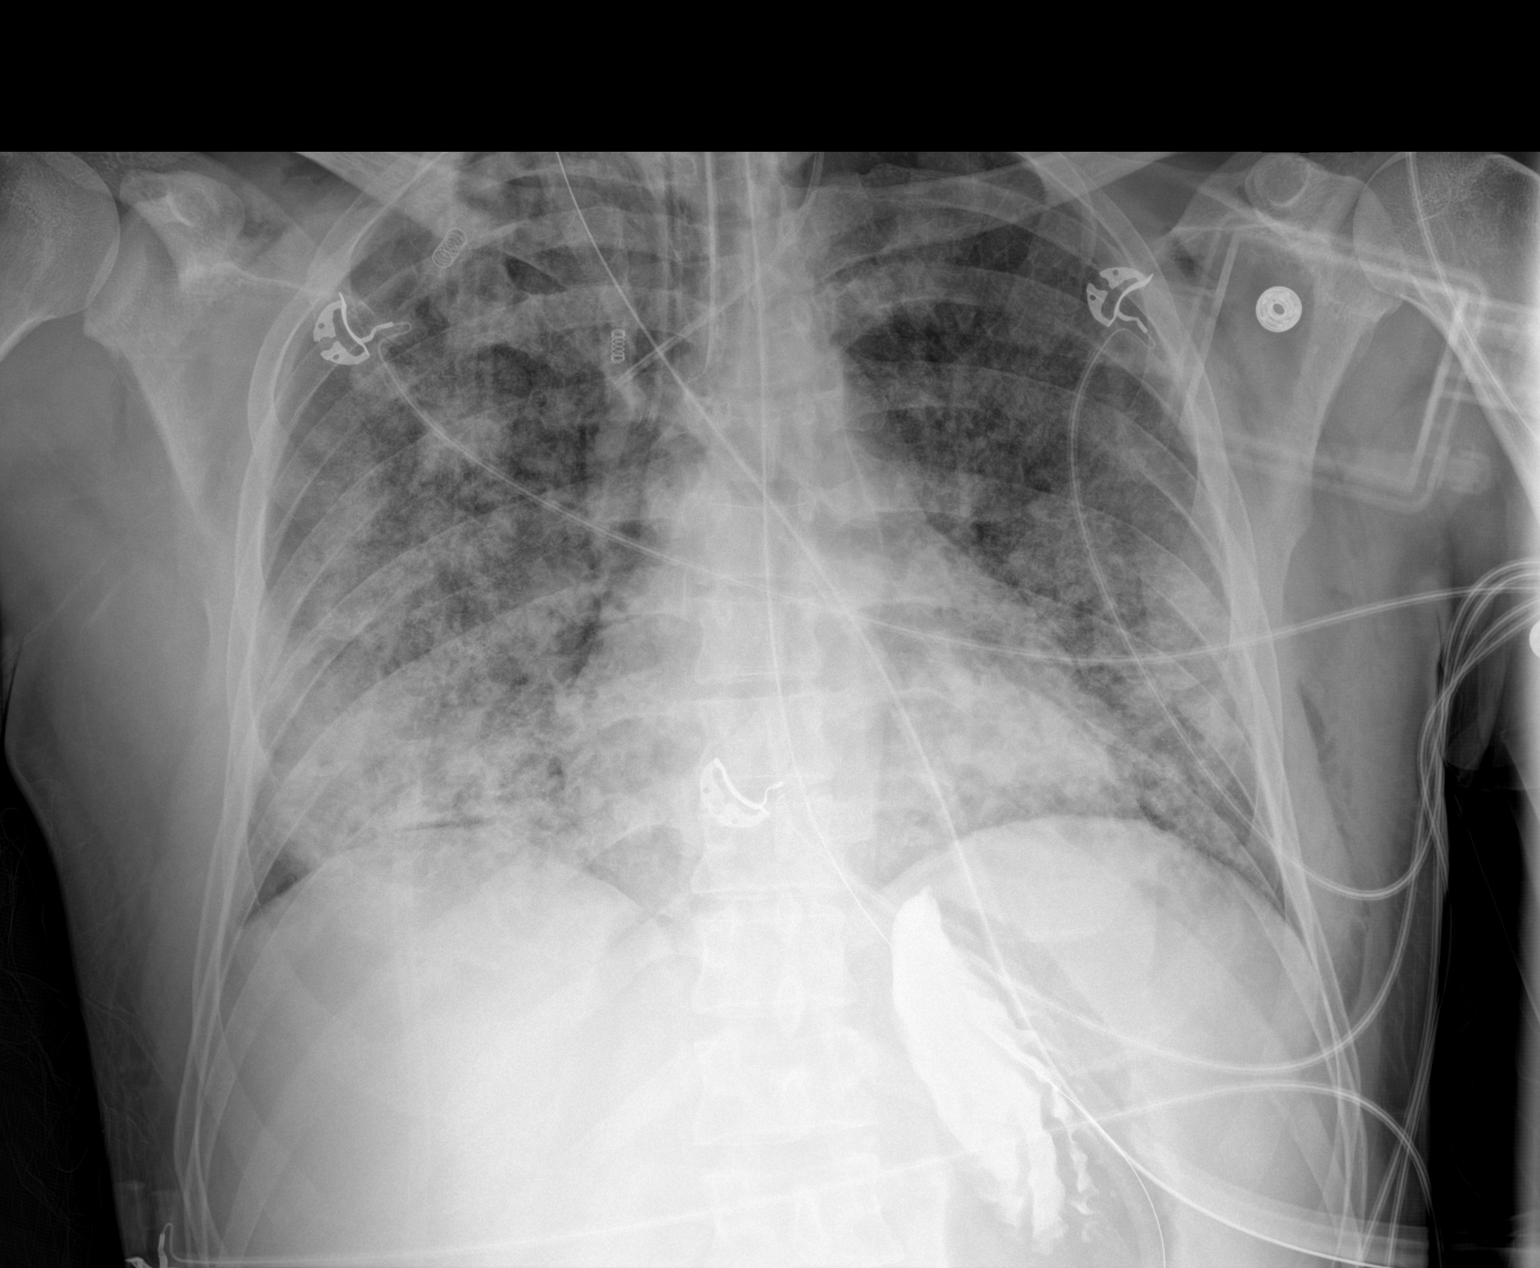

[1 of 1 positions shown; findings below may reference images not displayed]

FINDINGS: Endotracheal tube, NG tube, left IJ line in stable position. Heart
size normal. Pneumomediastinum cannot be excluded. Diffuse severe
bilateral pulmonary infiltrates/edema again noted. No pleural
effusion. Skin fold noted over the left apex, no definite
pneumothorax noted. Oral contrast in the stomach. Bilateral chest
wall and neck subcutaneous emphysema again noted.
IMPRESSION: 1.  Lines and tubes in stable position.

2. Pneumomediastinum cannot be excluded. Bilateral chest wall and
neck subcutaneous emphysema again noted. Skin fold noted over the
left apex. No definite pneumothorax noted.

3. Diffuse severe bilateral pulmonary infiltrates/edema again noted.
